# Patient Record
Sex: Female | Born: 1940 | Race: White | Hispanic: No | Marital: Married | Smoking: Never smoker
Health system: Southern US, Community
[De-identification: ages and names within clinical notes are randomized; demographics above are authoritative.]

## PROBLEM LIST (undated history)

## (undated) DIAGNOSIS — K219 Gastro-esophageal reflux disease without esophagitis: Secondary | ICD-10-CM

## (undated) DIAGNOSIS — I480 Paroxysmal atrial fibrillation: Secondary | ICD-10-CM

## (undated) DIAGNOSIS — E039 Hypothyroidism, unspecified: Secondary | ICD-10-CM

## (undated) DIAGNOSIS — Z9289 Personal history of other medical treatment: Secondary | ICD-10-CM

## (undated) DIAGNOSIS — I10 Essential (primary) hypertension: Secondary | ICD-10-CM

## (undated) DIAGNOSIS — E785 Hyperlipidemia, unspecified: Secondary | ICD-10-CM

## (undated) HISTORY — DX: Essential (primary) hypertension: I10

## (undated) HISTORY — DX: Hyperlipidemia, unspecified: E78.5

## (undated) HISTORY — DX: Gastro-esophageal reflux disease without esophagitis: K21.9

## (undated) HISTORY — DX: Hypothyroidism, unspecified: E03.9

## (undated) HISTORY — DX: Personal history of other medical treatment: Z92.89

## (undated) HISTORY — PX: OTHER SURGICAL HISTORY: SHX169

---

## 1978-10-06 HISTORY — PX: WISDOM TOOTH EXTRACTION: SHX21

## 1998-03-02 ENCOUNTER — Other Ambulatory Visit: Admission: RE | Admit: 1998-03-02 | Discharge: 1998-03-02 | Payer: Self-pay | Admitting: Obstetrics and Gynecology

## 1998-08-27 ENCOUNTER — Other Ambulatory Visit: Admission: RE | Admit: 1998-08-27 | Discharge: 1998-08-27 | Payer: Self-pay | Admitting: Obstetrics and Gynecology

## 1998-12-24 ENCOUNTER — Other Ambulatory Visit: Admission: RE | Admit: 1998-12-24 | Discharge: 1998-12-24 | Payer: Self-pay | Admitting: Obstetrics and Gynecology

## 1999-04-18 ENCOUNTER — Other Ambulatory Visit: Admission: RE | Admit: 1999-04-18 | Discharge: 1999-04-18 | Payer: Self-pay | Admitting: Obstetrics and Gynecology

## 1999-10-11 ENCOUNTER — Other Ambulatory Visit: Admission: RE | Admit: 1999-10-11 | Discharge: 1999-10-11 | Payer: Self-pay | Admitting: Obstetrics and Gynecology

## 2000-11-24 ENCOUNTER — Other Ambulatory Visit: Admission: RE | Admit: 2000-11-24 | Discharge: 2000-11-24 | Payer: Self-pay | Admitting: Obstetrics and Gynecology

## 2001-04-26 ENCOUNTER — Encounter (INDEPENDENT_AMBULATORY_CARE_PROVIDER_SITE_OTHER): Payer: Self-pay | Admitting: Specialist

## 2001-04-26 ENCOUNTER — Ambulatory Visit (HOSPITAL_COMMUNITY): Admission: RE | Admit: 2001-04-26 | Discharge: 2001-04-26 | Payer: Self-pay | Admitting: Obstetrics and Gynecology

## 2002-03-22 ENCOUNTER — Encounter (INDEPENDENT_AMBULATORY_CARE_PROVIDER_SITE_OTHER): Payer: Self-pay | Admitting: Gastroenterology

## 2002-07-06 ENCOUNTER — Other Ambulatory Visit: Admission: RE | Admit: 2002-07-06 | Discharge: 2002-07-06 | Payer: Self-pay | Admitting: Obstetrics and Gynecology

## 2003-08-10 ENCOUNTER — Other Ambulatory Visit: Admission: RE | Admit: 2003-08-10 | Discharge: 2003-08-10 | Payer: Self-pay | Admitting: Obstetrics and Gynecology

## 2004-07-16 ENCOUNTER — Encounter (INDEPENDENT_AMBULATORY_CARE_PROVIDER_SITE_OTHER): Payer: Self-pay | Admitting: Gastroenterology

## 2005-01-27 ENCOUNTER — Ambulatory Visit: Payer: Self-pay | Admitting: Gastroenterology

## 2005-01-29 ENCOUNTER — Ambulatory Visit: Payer: Self-pay | Admitting: Gastroenterology

## 2005-01-30 ENCOUNTER — Encounter: Payer: Self-pay | Admitting: Internal Medicine

## 2005-01-31 ENCOUNTER — Other Ambulatory Visit: Admission: RE | Admit: 2005-01-31 | Discharge: 2005-01-31 | Payer: Self-pay | Admitting: Obstetrics and Gynecology

## 2007-02-10 ENCOUNTER — Ambulatory Visit: Payer: Self-pay | Admitting: Gastroenterology

## 2007-11-17 ENCOUNTER — Encounter: Payer: Self-pay | Admitting: Internal Medicine

## 2008-04-10 ENCOUNTER — Ambulatory Visit: Payer: Self-pay | Admitting: Internal Medicine

## 2008-04-10 DIAGNOSIS — K589 Irritable bowel syndrome without diarrhea: Secondary | ICD-10-CM | POA: Insufficient documentation

## 2008-06-19 ENCOUNTER — Encounter: Payer: Self-pay | Admitting: Internal Medicine

## 2009-08-28 ENCOUNTER — Encounter: Payer: Self-pay | Admitting: Internal Medicine

## 2010-11-05 NOTE — Procedures (Signed)
Summary: Colonoscopy   Colonoscopy  Procedure date:  03/22/2002  Findings:      Location:  [facility].    Patient Name: Lindsey Goodman, Lindsey Goodman MRN:  Procedure Procedures: Colonoscopy CPT: [phone].  Personnel: Endoscopist: Ulyess Mort, MD.  Exam Location: Exam performed in Outpatient Clinic. Outpatient  Patient Consent: Procedure, Alternatives, Risks and Benefits discussed, consent obtained, from patient. Consent was obtained by the RN.  Indications Symptoms: Constipation Hematochezia. Abdominal pain / bloating.  History  Pre-Exam Physical: Performed Mar 22, 2002. Rectal exam, Abdominal exam, Extremity exam, Mental status exam WNL.  Exam Exam: Extent of exam reached: Cecum, extent intended: Cecum.  The cecum was identified by appendiceal orifice and IC valve. Colon retroflexion performed. Images were not taken. ASA Classification: II. Tolerance: good.  Monitoring: Pulse and BP monitoring, Oximetry used. Supplemental O2 given.  Colon Prep Prep results: good.  Sedation Meds: Patient assessed and found to be appropriate for moderate (conscious) sedation. Fentanyl 100 mcg. given IV. Versed 7 mg. given IV.  Findings - NOT SEEN ON EXAM: Cecum to Rectum. Polyps, AVM's, Colitis, Tumors, Melanosis, Crohn's, Diverticulosis, Comments: ext. hems.  - HEMORRHOIDS: External. Size: Grade I. Not bleeding. ICD9: Hemorrhoids, External: 455.3.   Assessment Abnormal examination, see findings above.  Diagnoses: 455.3: Hemorrhoids, External.   Events  Unplanned Interventions: No intervention was required.  Unplanned Events: There were no complications. Plans Medication Plan: Continue current medications. Anti-constipation: Stool Surfactant Agents   Fiber supplements: Methylcellulose  Hemorrhoidal Medications:   Disposition: After procedure patient sent to recovery. After recovery patient sent home.   This report was created from the original endoscopy report,  which was reviewed and signed by the above listed endoscopist.    Appended Document: Colonoscopy     Colonoscopy  Procedure date:  03/22/2002  Findings:      Location:  [facility].  Results: Hemorrhoids.       Procedures Next Due Date:    Colonoscopy: 03/2012

## 2010-11-05 NOTE — Consult Note (Signed)
Summary: [facility]   Imported By: Maryln Gottron 12/01/2007 15:14:14  _____________________________________________________________________  External Attachment:    Type:   Image     Comment:   External Document

## 2010-11-05 NOTE — Letter (Signed)
Summary: [facility]  [facility]   Imported By: Lanelle Bal 09/03/2009 11:55:19  _____________________________________________________________________  External Attachment:    Type:   Image     Comment:   External Document

## 2010-11-05 NOTE — Assessment & Plan Note (Signed)
Summary: IBS FLARE   History of Present Illness Visit Type: follow up Primary GI MD: Stan Head MD [facility] Primary Provider: Illene Regulus, MD Requesting Provider: n/a Chief Complaint: flare up of IBS  RUQ/RLQ pain in the afternoons History of Present Illness:   Difficulty with intermittent bloating. She gets RUQ and/or RLQ pain radiating to back, which is same problem over the years. No bleeding reported. Chronic problem but had a bad spell about 1 week ago so came for follow-up. Started Activea yogurt with mild benefit. Has chronic constipation, moves bowels 3 times a week but takes stool softener1-2 a day (Docusate). for that and thinks she won't go without. Once or twice a month takes Correctol with good result. In past on Zelnorm per notes, but she does not remember, may not have been filled and then off market. She attributes at least some of her constipation to anti-hypertensives.   Mainly sees Dr. Tresa Endo for BP f/u and thyroid, cholesterol f/u. Sees Dr. Arelia Sneddon for GYN. Has not seen Dr. Debby Bud in a long time.   Appetite ok. No weight loss.               Prior Medications Reviewed Using: List Brought by Patient  Updated Prior Medication List: LIPITOR 40 MG  TABS (ATORVASTATIN CALCIUM) Take 1 tablet by mouth once a day ZETIA 10 MG  TABS (EZETIMIBE) Take 1 tablet by mouth once a day METOPROLOL TARTRATE 100 MG  TABS (METOPROLOL TARTRATE) one in the am and one in the pm SYNTHROID 100 MCG  TABS (LEVOTHYROXINE SODIUM) Take 1 tablet by mouth once a day COZAAR 100 MG  TABS (LOSARTAN POTASSIUM) one tablet daily OMEPRAZOLE 20 MG  CPDR (OMEPRAZOLE) 1 each day 30 minutes before meal TEKTURNA 300 MG  TABS (ALISKIREN FUMARATE) one tablet every day METOPROLOL TARTRATE 25 MG  TABS (METOPROLOL TARTRATE) one every pm with  Current Allergies (reviewed today): PREDNISONE NORVASC  Past Medical History:    HIatal Hernia    Hemorrhoids    GERD    Hyperlipidemia    Hypothyroidism  Irritable Bowel Syndrome   Family History:    No FH of Colon Cancer:    Family History of Heart Disease:     Family History of Kidney Disease: grandfather  Social History:    Patient has never smoked.     Alcohol Use - yes  wine occ.    Daily Caffeine Use   some tea    Illicit Drug Use - no    Patient gets regular exercise.   Risk Factors:  Tobacco use:  never Drug use:  no Alcohol use:  yes Exercise:  yes    Vital Signs:  Patient Profile:   70 Years Old Female Height:     65 inches Weight:      145 pounds BMI:     24.22 Pulse (ortho):   80 / minute Pulse rhythm:   regular BP sitting:   114 / 80  (left arm)  Vitals Entered By: Merri Ray CMA (April 10, 2008 9:49 AM)                  Physical Exam  General:     Well developed, well nourished, no acute distress. Head:     Normocephalic and atraumatic. Eyes:     anicteric Lungs:     Clear throughout to auscultation. Heart:     Regular rate and rhythm; no murmurs, rubs,  or bruits. Abdomen:     Soft,  nontender and nondistended. No masses, hepatosplenomegaly or hernias noted. Normal bowel sounds. Neurologic:     Alert and  oriented x4;  grossly normal neurologically.    Impression & Recommendations:  Problem # 1:  IRRITABLE BOWEL SYNDROME (ICD-564.1) Assessment: Unchanged Her symptoms of bloating, RUQ/RLQ pain and cosntipation are chronic and sound like what has been described in the chart by Dr. Victorino Dike at prior evaluations.  It does not sound like gallbladder and ultrasound negative on 01/30/05 except for renal cysts. Will try Align and MiraLax with f/u as needed. She needs a routine colonoscopy in 2013 (average-risk patient).     Patient Instructions: 1)  Take 1 Align capsule daily (replaces Activea yogurt) 2)  Start MiraLax 17 grams or 1 tablespoon in 8 oz liquid daily and adjust up or down for constipation relief.  3)  You will probably not need your stool softener any more. 4)  If  you do not find relief from this regimen after 1-2 months, return to me. 5)  Copy Sent To: Dr. Daphene Jaeger and Dr. Richardean Chimera    ]

## 2010-11-05 NOTE — Consult Note (Signed)
Summary: [facility]  [facility]   Imported By: Esmeralda Links D'jimraou 06/27/2008 15:16:03  _____________________________________________________________________  External Attachment:    Type:   Image     Comment:   External Document

## 2010-12-09 DIAGNOSIS — Z9289 Personal history of other medical treatment: Secondary | ICD-10-CM

## 2010-12-09 HISTORY — DX: Personal history of other medical treatment: Z92.89

## 2011-02-21 NOTE — Assessment & Plan Note (Signed)
[facility]                         GASTROENTEROLOGY OFFICE NOTE   [name], [name]                        MRN:          [id number]  DATE:02/10/2007                            DOB:          January 09, 1941    Lindsey Goodman comes in says she has been having some bright red blood where she  strains and had a bowel movement, possibly due to hemorrhoids, she is  having them just intermittently. Patient asked about stool softeners. I  talked to her in depth about that it was ok for her to have stool  softeners, __________ at [facility] last year that people over 50 could be  on stool softeners as necessary. I have seen her in the past with  complaint of constipation related to what I thought was irritable bowel  syndrome. She also has GERD with positive __________ amputations,  hyperlipidemia, and history of hypothyroidism. We put her on some  Zelnorm at that time but this has been taken off the market.   On physical examination Lindsey Goodman weighed 132, blood pressure 118/70, pulse  60 and regular.  NECK, HEART, EXTREMITIES:  Unremarkable.   IMPRESSION:  1. Rectal bleeding, probably hemorrhoidal.  The patient has known      external hemorrhoids and had a colonoscopic examination in 2003 and      complained of constipation at that time.  2. Gastroesophageal reflux disease.  3. History of hypothyroidism.  4. Hyperlipidemia.   RECOMMENDATIONS:  She is to use Senokot with Senna, prunes, hemorrhoidal  cream. I gave her samples of this. If she was not any better we need to  pursue repeat colonoscopic examination sometime in the near future. It  has been approximately 5 years since her last procedure. I told her that  she should try the above measures and just to let us know how she  responds.     Ulyess Mort, MD  Electronically Signed    SML/MedQ  DD: 02/10/2007  DT: 02/10/2007  Job #: [id number]

## 2011-02-21 NOTE — Op Note (Signed)
[facility]  Patient:    Lindsey Goodman, Lindsey Goodman                        MRN: [id number] Proc. Date: 04/26/01 Adm. Date:  [id number] Attending:  Frederich Balding                           Operative Report  PREOPERATIVE DIAGNOSIS:       Abnormal bleeding with evidence of endometrial polyps or submucosal fibroids.  POSTOPERATIVE DIAGNOSIS:      Abnormal bleeding with evidence of endometrial polyps or submucosal fibroids, with pathology pending.  OPERATION/PROCEDURE:          Hysteroscopic evaluation and resection of endometrial polyps and fibroids.  SURGEON:                      Juluis Mire, M.D.  ANESTHESIA:                   Laryngeal general.  ESTIMATED BLOOD LOSS:         Minimal.  PACKS/DRAINS:                 None.  INTRAOPERATIVE BLOOD REPLACED:                     None.  COMPLICATIONS:                None.  INDICATIONS FOR PROCEDURE:    Dictated in the History and Physical.  DESCRIPTION OF PROCEDURE:     The patient was taken to the operating room and placed in the supine position.  AFter a satisfactory level of laryngeal general anesthesia was obtained the patient was placed in the dorsal lithotomy position using the Allen stirrups.  The perineum and vagina were prepped with Betadine and draped as a sterile field.  A speculum was placed in the vaginal vault and the cervix grasped with a single-tooth tenaculum.  The uterus was sounded to 8 cm.  The cervix was dilated to a size 31 Pratt dilator and the operative hysteroscope then reduced.  Evaluation did reveal a polyp on the posterior left wall.  Above that was a submucosal fibroid.  She also had a submucosal fibroid on the right lateral wall.  All these areas were resected using a resectoscope, with no evidence of uterine perforation or active bleeding.  We did obtain multiple endometrial samplings with the resectoscope and then endometrial curettings.  Again, she had minimal bleeding  and no evidence of perforation.  At this point in time the single-tooth tenaculum and speculum were removed.  The patient was taken out of the dorsal lithotomy position and once extubated and alert was transferred to the recovery room in good condition.  Sponge, needle, and instrument counts were reported as correct by the circulating nurse. DD:  04/27/01 TD:  04/27/01 Job: [id number]

## 2011-12-15 ENCOUNTER — Encounter (INDEPENDENT_AMBULATORY_CARE_PROVIDER_SITE_OTHER): Payer: Self-pay | Admitting: General Surgery

## 2012-01-14 ENCOUNTER — Telehealth (INDEPENDENT_AMBULATORY_CARE_PROVIDER_SITE_OTHER): Payer: Self-pay

## 2012-01-14 NOTE — Telephone Encounter (Signed)
error 

## 2012-01-16 ENCOUNTER — Ambulatory Visit (INDEPENDENT_AMBULATORY_CARE_PROVIDER_SITE_OTHER): Payer: Medicare Other | Admitting: General Surgery

## 2012-01-16 ENCOUNTER — Encounter (INDEPENDENT_AMBULATORY_CARE_PROVIDER_SITE_OTHER): Payer: Self-pay | Admitting: General Surgery

## 2012-01-16 VITALS — BP 144/86 | HR 64 | Temp 98.0°F | Resp 16 | Ht 65.0 in | Wt 145.6 lb

## 2012-01-16 DIAGNOSIS — D171 Benign lipomatous neoplasm of skin and subcutaneous tissue of trunk: Secondary | ICD-10-CM | POA: Insufficient documentation

## 2012-01-16 DIAGNOSIS — D1779 Benign lipomatous neoplasm of other sites: Secondary | ICD-10-CM

## 2012-01-16 NOTE — Assessment & Plan Note (Signed)
Mass is minimally larger vs unchanged.  Would leave it alone. Recommended follow up PRN. We can remove if painful or rubbing on furniture or bra.    No risk of malignancy.

## 2012-01-16 NOTE — Progress Notes (Signed)
HISTORY: The patient is a 71 year old female last saw last year for a right back lipoma. She was very concerned about this. Right after it was detected by her primary care physician, she was very concerned about his risk for malignancy. I reassured her last year and did a careful measurement. She is here to followup to make sure that it has not grown significantly. She continues to deny symptoms of the mass. She does have some back pain that is remote from the location of the mass. She does not notice that when she leans back and does not feel it rubbing on her clothing.  She denies any other new medical problems. She has not noticed any masses anywhere else.   PERTINENT REVIEW OF SYSTEMS: Otherwise negative.    EXAM: Head: Normocephalic and atraumatic.  Eyes:  Conjunctivae are normal. Pupils are equal, round, and reactive to light. No scleral icterus.  Neck:  Normal range of motion. Neck supple. No tracheal deviation present. No thyromegaly present.  Resp: No respiratory distress, normal effort. Abd:  Abdomen is soft, non distended and non tender. No masses are palpable.  There is no rebound and no guarding.  Neurological: Alert and oriented to person, place, and time. Coordination normal.  Skin: Skin is warm and dry. No rash noted. No diaphoretic. No erythema. No pallor.  Mass is 3.5 x 1.5 x 1 cm and mobile.   Psychiatric: Normal mood and affect. Normal behavior. Judgment and thought content normal.      ASSESSMENT AND PLAN:   Lipoma of back Mass is minimally larger vs unchanged.  Would leave it alone. Recommended follow up PRN. We can remove if painful or rubbing on furniture or bra.    No risk of malignancy.       Maudry Diego, MD Surgical Oncology, General & Endocrine Surgery [facility]  Lennette Bihari, MD, MD Arelia Sneddon, Raphael Gibney, MD

## 2012-01-16 NOTE — Patient Instructions (Signed)
Follow up if mass enlarging and becoming symptomatic.

## 2012-02-04 ENCOUNTER — Encounter: Payer: Self-pay | Admitting: Internal Medicine

## 2012-07-02 ENCOUNTER — Encounter: Payer: Self-pay | Admitting: Internal Medicine

## 2012-07-29 ENCOUNTER — Ambulatory Visit (AMBULATORY_SURGERY_CENTER): Payer: Medicare Other | Admitting: *Deleted

## 2012-07-29 VITALS — Ht 65.0 in | Wt 146.5 lb

## 2012-07-29 DIAGNOSIS — Z1211 Encounter for screening for malignant neoplasm of colon: Secondary | ICD-10-CM

## 2012-07-29 MED ORDER — NA SULFATE-K SULFATE-MG SULF 17.5-3.13-1.6 GM/177ML PO SOLN: ORAL | Status: DC

## 2012-08-09 ENCOUNTER — Ambulatory Visit (INDEPENDENT_AMBULATORY_CARE_PROVIDER_SITE_OTHER): Payer: Medicare Other | Admitting: Family Medicine

## 2012-08-09 VITALS — BP 130/64 | HR 65 | Temp 98.6°F | Resp 18 | Ht 63.5 in | Wt 145.6 lb

## 2012-08-09 DIAGNOSIS — J040 Acute laryngitis: Secondary | ICD-10-CM

## 2012-08-09 MED ORDER — HYDROCODONE-HOMATROPINE 5-1.5 MG/5ML PO SYRP: 5.0000 mL | ORAL_SOLUTION | Freq: Three times a day (TID) | ORAL | Status: DC | PRN

## 2012-08-09 NOTE — Progress Notes (Signed)
71 yo woman with 8 days of cough, hoarseness, and mild right ear discomfort.  Improving now, but the hoarseness and cough are hanging on.  Objective: NAD HEENT:  Unremarkable Chest:  Clear Voice: hoarse  Assessment:  Laryngitis, viral  Plan:  hydromet

## 2012-08-12 ENCOUNTER — Encounter: Payer: Medicare Other | Admitting: Internal Medicine

## 2013-01-05 ENCOUNTER — Encounter: Payer: Self-pay | Admitting: Internal Medicine

## 2013-01-31 ENCOUNTER — Encounter: Payer: Self-pay | Admitting: Internal Medicine

## 2013-03-17 ENCOUNTER — Ambulatory Visit (AMBULATORY_SURGERY_CENTER): Payer: Medicare Other | Admitting: *Deleted

## 2013-03-17 VITALS — Ht 65.0 in | Wt 150.8 lb

## 2013-03-17 DIAGNOSIS — Z1211 Encounter for screening for malignant neoplasm of colon: Secondary | ICD-10-CM

## 2013-03-23 ENCOUNTER — Other Ambulatory Visit: Payer: Self-pay | Admitting: Cardiovascular Disease

## 2013-03-23 ENCOUNTER — Telehealth: Payer: Self-pay | Admitting: *Deleted

## 2013-03-23 NOTE — Telephone Encounter (Signed)
Refilled patient's levothyroxine to pharmacy.

## 2013-03-24 ENCOUNTER — Ambulatory Visit (AMBULATORY_SURGERY_CENTER): Payer: Medicare Other | Admitting: Internal Medicine

## 2013-03-24 ENCOUNTER — Encounter: Payer: Self-pay | Admitting: Internal Medicine

## 2013-03-24 VITALS — BP 110/57 | HR 56 | Temp 97.1°F | Resp 17 | Ht 65.0 in | Wt 150.0 lb

## 2013-03-24 DIAGNOSIS — K644 Residual hemorrhoidal skin tags: Secondary | ICD-10-CM

## 2013-03-24 DIAGNOSIS — K648 Other hemorrhoids: Secondary | ICD-10-CM

## 2013-03-24 DIAGNOSIS — Z1211 Encounter for screening for malignant neoplasm of colon: Secondary | ICD-10-CM

## 2013-03-24 DIAGNOSIS — K5730 Diverticulosis of large intestine without perforation or abscess without bleeding: Secondary | ICD-10-CM

## 2013-03-24 MED ORDER — SODIUM CHLORIDE 0.9 % IV SOLN: 500.0000 mL | INTRAVENOUS | Status: DC

## 2013-03-24 NOTE — Patient Instructions (Addendum)
The colonoscopy did not show any polyps or cancer!  You do have diverticulosis and hemorrhoids.  Next routine colonoscopy - 10 years though at 58 you might not need it.  If the hemorrhoids are bothering you I offer an in-office banding procedure that can help. You can arrange a visit with me to find out more if desired.  I appreciate the opportunity to care for you. Iva Boop, MD, FACG  YOU HAD AN ENDOSCOPIC PROCEDURE TODAY AT THE [facility]: Refer to the procedure report that was given to you for any specific questions about what was found during the examination.  If the procedure report does not answer your questions, please call your gastroenterologist to clarify.  If you requested that your care partner not be given the details of your procedure findings, then the procedure report has been included in a sealed envelope for you to review at your convenience later.  YOU SHOULD EXPECT: Some feelings of bloating in the abdomen. Passage of more gas than usual.  Walking can help get rid of the air that was put into your GI tract during the procedure and reduce the bloating. If you had a lower endoscopy (such as a colonoscopy or flexible sigmoidoscopy) you may notice spotting of blood in your stool or on the toilet paper. If you underwent a bowel prep for your procedure, then you may not have a normal bowel movement for a few days.  DIET: Your first meal following the procedure should be a light meal and then it is ok to progress to your normal diet.  A half-sandwich or bowl of soup is an example of a good first meal.  Heavy or fried foods are harder to digest and may make you feel nauseous or bloated.  Likewise meals heavy in dairy and vegetables can cause extra gas to form and this can also increase the bloating.  Drink plenty of fluids but you should avoid alcoholic beverages for 24 hours.  ACTIVITY: Your care partner should take you home directly after the procedure.  You should  plan to take it easy, moving slowly for the rest of the day.  You can resume normal activity the day after the procedure however you should NOT DRIVE or use heavy machinery for 24 hours (because of the sedation medicines used during the test).    SYMPTOMS TO REPORT IMMEDIATELY: A gastroenterologist can be reached at any hour.  During normal business hours, 8:30 AM to 5:00 PM Monday through Friday, call [phone].  After hours and on weekends, please call the GI answering service at [phone] who will take a message and have the physician on call contact you.   Following lower endoscopy (colonoscopy or flexible sigmoidoscopy):  Excessive amounts of blood in the stool  Significant tenderness or worsening of abdominal pains  Swelling of the abdomen that is new, acute  Fever of 100F or higher  FOLLOW UP: If any biopsies were taken you will be contacted by phone or by letter within the next 1-3 weeks.  Call your gastroenterologist if you have not heard about the biopsies in 3 weeks. Our staff will call the home number listed on your records the next business day following your procedure to check on you and address any questions or concerns that you may have at that time regarding the information given to you following your procedure. This is a courtesy call and so if there is no answer at the home number and we have  not heard from you through the emergency physician on call, we will assume that you have returned to your regular daily activities without incident.  SIGNATURES/CONFIDENTIALITY: You and/or your care partner have signed paperwork which will be entered into your electronic medical record.  These signatures attest to the fact that that the information above on your After Visit Summary has been reviewed and is understood.  Full responsibility of the confidentiality of this discharge information lies with you and/or your care-partner.  Diverticulosis, hemorrhoids-handouts given  Call  office to discuss hemorrhoid therapy if desired

## 2013-03-24 NOTE — Op Note (Signed)
[facility] [address] [place], [address]   COLONOSCOPY PROCEDURE REPORT  PATIENT: Lindsey Goodman, Lindsey Goodman  MR#: [id number] BIRTHDATE: 1941-06-17 , 72  yrs. old GENDER: Female ENDOSCOPIST: Iva Boop, MD, [facility] PROCEDURE DATE:  03/24/2013 PROCEDURE:   Colonoscopy, screening ASA CLASS:   Class II INDICATIONS:average risk screening and Last colonoscopy performed 10 years ago. MEDICATIONS: propofol (Diprivan) 200mg  IV, MAC sedation, administered by CRNA, and These medications were titrated to patient response per physician's verbal order  DESCRIPTION OF PROCEDURE:   After the risks benefits and alternatives of the procedure were thoroughly explained, informed consent was obtained.  A digital rectal exam revealed no abnormalities of the rectum.   The LB [id number]  endoscope was introduced through the anus and advanced to the cecum, which was identified by both the appendix and ileocecal valve. No adverse events experienced.   The quality of the prep was excellent using Suprep  The instrument was then slowly withdrawn as the colon was fully examined.      COLON FINDINGS: Moderate diverticulosis was noted in the sigmoid colon.   The colon mucosa was otherwise normal.   A right colon retroflexion was performed.  Retroflexed views revealed internal/external hemorrhoids. The time to cecum=2 minutes 30 seconds.  Withdrawal time=7 minutes 0 seconds.  The scope was withdrawn and the procedure completed. COMPLICATIONS: There were no complications.  ENDOSCOPIC IMPRESSION: 1.   Moderate diverticulosis was noted in the sigmoid colon 2.   Internal hemorrhoids 3.   External hemorrhoids 4.   Normal colonoscopy otherwise - excellent prep  RECOMMENDATIONS: 1.  Repeat Colonscopy in 10 years (will be 82 so office assessment before procedure likely) 2.   Call office for appointment to discuss hemorrhoid therapy if desired   eSigned:  Iva Boop, MD, [facility]  03/24/2013 8:51 AM   cc: The Patient and Richardean Chimera, MD

## 2013-03-24 NOTE — Progress Notes (Signed)
Procedure ends, to recovery, report given, VSS and awake

## 2013-03-24 NOTE — Progress Notes (Signed)
Patient did not experience any of the following events: a burn prior to discharge; a fall within the facility; wrong site/side/patient/procedure/implant event; or a hospital transfer or hospital admission upon discharge from the facility. (G8907) Patient did not have preoperative order for IV antibiotic SSI prophylaxis. (G8918)  

## 2013-03-25 ENCOUNTER — Telehealth: Payer: Self-pay | Admitting: *Deleted

## 2013-03-25 NOTE — Telephone Encounter (Signed)
No identifier, left, message, follow-up

## 2013-06-22 ENCOUNTER — Other Ambulatory Visit: Payer: Self-pay | Admitting: *Deleted

## 2013-06-22 DIAGNOSIS — E8881 Metabolic syndrome: Secondary | ICD-10-CM

## 2013-06-22 DIAGNOSIS — Z79899 Other long term (current) drug therapy: Secondary | ICD-10-CM

## 2013-06-22 DIAGNOSIS — R5383 Other fatigue: Secondary | ICD-10-CM

## 2013-06-22 DIAGNOSIS — R5381 Other malaise: Secondary | ICD-10-CM

## 2013-06-30 ENCOUNTER — Other Ambulatory Visit: Payer: Self-pay | Admitting: Cardiovascular Disease

## 2013-07-01 NOTE — Telephone Encounter (Signed)
Rx was sent to pharmacy electronically. 

## 2013-07-18 ENCOUNTER — Other Ambulatory Visit: Payer: Self-pay | Admitting: Cardiovascular Disease

## 2013-07-18 ENCOUNTER — Telehealth: Payer: Self-pay | Admitting: *Deleted

## 2013-07-18 ENCOUNTER — Telehealth: Payer: Self-pay | Admitting: Cardiovascular Disease

## 2013-07-18 ENCOUNTER — Other Ambulatory Visit: Payer: Self-pay | Admitting: *Deleted

## 2013-07-18 DIAGNOSIS — Z79899 Other long term (current) drug therapy: Secondary | ICD-10-CM

## 2013-07-18 DIAGNOSIS — E782 Mixed hyperlipidemia: Secondary | ICD-10-CM

## 2013-07-18 DIAGNOSIS — E039 Hypothyroidism, unspecified: Secondary | ICD-10-CM

## 2013-07-18 NOTE — Telephone Encounter (Signed)
Please mailed out a lab order before her appt on 08/04/2013...   Thanks

## 2013-07-18 NOTE — Telephone Encounter (Signed)
Patient returned call to me in reference to her labwork.  I informed her that I will place the orders into epic and all she will need to do is go fasting to have them drawn. Patient voiced understanding.

## 2013-07-18 NOTE — Telephone Encounter (Signed)
Left message to return a call in reference to lab test request.

## 2013-07-18 NOTE — Telephone Encounter (Signed)
Rx was sent to pharmacy electronically. 

## 2013-07-27 ENCOUNTER — Other Ambulatory Visit: Payer: Self-pay | Admitting: Cardiovascular Disease

## 2013-07-28 LAB — CBC
HCT: 41.4 % (ref 36.0–46.0)
Hemoglobin: 14 g/dL (ref 12.0–15.0)
MCH: 30.8 pg (ref 26.0–34.0)
MCHC: 33.8 g/dL (ref 30.0–36.0)
MCV: 91.2 fL (ref 78.0–100.0)
Platelets: 305 10*3/uL (ref 150–400)
RBC: 4.54 MIL/uL (ref 3.87–5.11)
RDW: 14.1 % (ref 11.5–15.5)
WBC: 6.8 10*3/uL (ref 4.0–10.5)

## 2013-07-28 LAB — HEMOGLOBIN A1C
Hgb A1c MFr Bld: 6.1 % — ABNORMAL HIGH (ref <5.7)
Mean Plasma Glucose: 128 mg/dL — ABNORMAL HIGH (ref <117)

## 2013-07-28 LAB — BASIC METABOLIC PANEL
BUN: 16 mg/dL (ref 6–23)
CO2: 23 mEq/L (ref 19–32)
Calcium: 10.6 mg/dL — ABNORMAL HIGH (ref 8.4–10.5)
Chloride: 97 mEq/L (ref 96–112)
Creat: 0.89 mg/dL (ref 0.50–1.10)
Glucose, Bld: 104 mg/dL — ABNORMAL HIGH (ref 70–99)
Potassium: 4.8 mEq/L (ref 3.5–5.3)
Sodium: 133 mEq/L — ABNORMAL LOW (ref 135–145)

## 2013-07-28 LAB — LIPID PANEL
Cholesterol: 162 mg/dL (ref 0–200)
HDL: 54 mg/dL (ref >39)
LDL Cholesterol: 92 mg/dL (ref 0–99)
Total CHOL/HDL Ratio: 3 Ratio
Triglycerides: 80 mg/dL (ref <150)
VLDL: 16 mg/dL (ref 0–40)

## 2013-07-28 LAB — TSH: TSH: 1.565 u[IU]/mL (ref 0.350–4.500)

## 2013-08-04 ENCOUNTER — Encounter: Payer: Self-pay | Admitting: Cardiovascular Disease

## 2013-08-04 ENCOUNTER — Ambulatory Visit (INDEPENDENT_AMBULATORY_CARE_PROVIDER_SITE_OTHER): Payer: Medicare Other | Admitting: Cardiovascular Disease

## 2013-08-04 VITALS — BP 130/72 | HR 57 | Ht 64.0 in | Wt 143.3 lb

## 2013-08-04 DIAGNOSIS — R7309 Other abnormal glucose: Secondary | ICD-10-CM

## 2013-08-04 DIAGNOSIS — E785 Hyperlipidemia, unspecified: Secondary | ICD-10-CM

## 2013-08-04 DIAGNOSIS — R5381 Other malaise: Secondary | ICD-10-CM

## 2013-08-04 DIAGNOSIS — R5383 Other fatigue: Secondary | ICD-10-CM

## 2013-08-04 DIAGNOSIS — I10 Essential (primary) hypertension: Secondary | ICD-10-CM

## 2013-08-04 DIAGNOSIS — E039 Hypothyroidism, unspecified: Secondary | ICD-10-CM

## 2013-08-04 DIAGNOSIS — I701 Atherosclerosis of renal artery: Secondary | ICD-10-CM | POA: Insufficient documentation

## 2013-08-04 DIAGNOSIS — Z79899 Other long term (current) drug therapy: Secondary | ICD-10-CM

## 2013-08-04 DIAGNOSIS — K219 Gastro-esophageal reflux disease without esophagitis: Secondary | ICD-10-CM

## 2013-08-04 NOTE — Patient Instructions (Signed)
Your physician recommends that you schedule a follow-up appointment in:  April 2015  Your physician recommends that you return for lab work in: March 2015 CBC, CMP, LIPIDS, TSH, A1C  Your physician has requested that you have a renal artery duplex. During this test, an ultrasound is used to evaluate blood flow to the kidneys. Allow one hour for this exam. Do not eat after midnight the day before and avoid carbonated beverages. Take your medications as you usually do. March 2015

## 2013-08-04 NOTE — Progress Notes (Signed)
Patient ID: Craig Staggers, female   DOB: Jul 11, 1941, 72 y.o.   MRN: [id number]     HPI: HARUMI YAMIN is a 72 y.o. female presents for a 8 month followup cardiology evaluation.  Mrs. he is a 72 year old female who has a history of hypertension, hypothyroidism, hyperlipidemia, as well as GERD. Remotely, she had developed a cough secondary to ACE inhibition and also developed hyponatremia secondary to hydrochlorothiazide. 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 She had equal to or greater than 60% diameter reduction in the right renal artery. Her left renal artery have normal patency. Her kidney sizes were normal. Since I last saw her, she has remained fairly asymptomatic. Her blood pressure has been well controlled with combination Benicar 40 mg as well as metoprolol tartrate 125 mg twice a day. She also takes Aldactone 25 mg daily. She has been on combination therapy with Zetia 10 mg and atorvastatin 40 mg for her hyperlipidemia. She has taken Prilosec 20 mg for her GERD. She denies recent chest pain. She denies shortness of breath third time she does note some mild disability.  She did have recent laboratory one week ago which showed a sodium of 133. Fasting glucose 104. BUN 16 creatinine 0.89. Calcium was borderline elevated at 10.6. Showed a normal CBC. Total cholesterol was 162 triglycerides 80 HDL 54 LDL 92. Hemoglobin A1c was mildly elevated at 6.1.  Past Medical History  Diagnosis Date  . GERD (gastroesophageal reflux disease)   . Hypertension   . Hyperlipidemia     Past Surgical History  Procedure Laterality Date  . No prior surgery      Allergies  Allergen Reactions  . Amlodipine Besylate Swelling  . Prednisone     No appetite    Current Outpatient Prescriptions  Medication Sig Dispense Refill  . atorvastatin (LIPITOR) 40 MG tablet daily.      Marland Kitchen BENICAR  40 MG tablet daily.      Marland Kitchen levothyroxine (SYNTHROID, LEVOTHROID) 100 MCG tablet TAKE 1 TABLET DAILY.  30 tablet  4  . metoprolol (LOPRESSOR) 100 MG tablet Take 100 mg by mouth 2 (two) times daily.      . metoprolol tartrate (LOPRESSOR) 25 MG tablet Take 25 mg by mouth 2 (two) times daily.      Marland Kitchen omeprazole (PRILOSEC) 20 MG capsule daily.      Marland Kitchen spironolactone (ALDACTONE) 25 MG tablet TAKE 1 TABLET EVERY DAY  30 tablet  5  . ZETIA 10 MG tablet daily.       No current facility-administered medications for this visit.    History   Social History  . Marital Status: Married    Spouse Name: N/A    Number of Children: N/A  . Years of Education: N/A   Occupational History  . Not on file.   Social History Main Topics  . Smoking status: Never Smoker   . Smokeless tobacco: Never Used  . Alcohol Use: No  . Drug Use: No  . Sexual Activity: Yes    Birth Control/ Protection: None   Other Topics Concern  . Not on file   Social History Narrative  . No narrative on file    Family History  Problem Relation Age of Onset  . Hypertension Mother   . Hypertension Father   . Stroke Father   . Colon cancer Neg Hx   .  Stomach cancer Neg Hx    Social history is notable that she is married has 2 children. He does travel with her husband's work as well as vacation. She does exercise. There is no tobacco or alcohol use.  ROS is negative for fevers, chills or night sweats she denies or cough. She denies sputum production. She denies wheezing. She denies palpitations. She denies presyncope or syncope. She denies chest pressure. She denies pleuritic symptoms. She denies change in bowel bladder habits. There is no nausea vomiting or diarrhea. She denies GU or GI symptoms. She denies claudication. She denies neuropathy. She does note easy bruisability. She denies myalgias. She is unaware of overt diabetes. She denies change in mood or affect.   Other comprehensive 12 point system review is  negative.  PE BP 130/72  Pulse 57  Ht 5\' 4"  (1.626 m)  Wt 143 lb 4.8 oz (65 kg)  BMI 24.59 kg/m2  General: Alert, oriented, no distress.  Skin: normal turgor, no rashes, small area of ecchymosis in the region of the wrist HEENT: Normocephalic, atraumatic. Pupils round and reactive; sclera anicteric;no lid lag.  Nose without nasal septal hypertrophy Mouth/Parynx benign; Mallinpatti scale 2 Neck: No JVD, no carotid briuts Lungs: clear to ausculatation and percussion; no wheezing or rales Heart: RRR, s1 s2 normal 1/6 sem Abdomen: Abdominal bruit ; soft, nontender; no hepatosplenomehaly, BS+; abdominal aorta nontender and not dilated by palpation. Pulses 2+ Extremities: no clubbing cyanosis or edema, Homan's sign negative  Neurologic: grossly nonfocal Psychologic: normal affect and mood.  ECG: Sinus bradycardia 57 beats per minute. Normal intervals. No significant ST changes.  LABS:  BMET    Component Value Date/Time   NA 133* 07/27/2013 1015   K 4.8 07/27/2013 1015   CL 97 07/27/2013 1015   CO2 23 07/27/2013 1015   GLUCOSE 104* 07/27/2013 1015   BUN 16 07/27/2013 1015   CREATININE 0.89 07/27/2013 1015   CALCIUM 10.6* 07/27/2013 1015     Hepatic Function Panel  No results found for this basename: prot, albumin, ast, alt, alkphos, bilitot, bilidir, ibili     CBC    Component Value Date/Time   WBC 6.8 07/27/2013 1015   RBC 4.54 07/27/2013 1015   HGB 14.0 07/27/2013 1015   HCT 41.4 07/27/2013 1015   PLT 305 07/27/2013 1015   MCV 91.2 07/27/2013 1015   MCH 30.8 07/27/2013 1015   MCHC 33.8 07/27/2013 1015   RDW 14.1 07/27/2013 1015     BNP No results found for this basename: probnp    Lipid Panel     Component Value Date/Time   CHOL 162 07/27/2013 1015   TRIG 80 07/27/2013 1015   HDL 54 07/27/2013 1015   CHOLHDL 3.0 07/27/2013 1015   VLDL 16 07/27/2013 1015   LDLCALC 92 07/27/2013 1015     RADIOLOGY: No results found.    ASSESSMENT AND  PLAN: This is that he is now 72 years old. 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 Her last renal duplex scan was March 2013 at which time her PSV was 261 with EDV 55 on the right. He was also found to have narrowing in the distal segment of her aorta. Her blood pressure today is well controlled. I have suggested she take a baby aspirin at least every other day if possible but to be cognizant that this may cause bruisability. Her calcium is borderline upper normal to slightly elevated. She is on thyroid replacement for hypothyroidism.  Her TSH was normal. In 6 months, I'm recommending she undergo a two-year followup renal duplex scan. Repeat laboratory will be obtained I will see her back in the office for followup evaluation.     Lennette Bihari, MD, [facility]  08/04/2013 8:54 AM

## 2013-08-10 ENCOUNTER — Encounter: Payer: Self-pay | Admitting: *Deleted

## 2013-08-10 NOTE — Progress Notes (Signed)
Quick Note:  Lab result letter sent to patient. ______

## 2013-08-14 ENCOUNTER — Other Ambulatory Visit: Payer: Self-pay | Admitting: Cardiovascular Disease

## 2013-08-15 NOTE — Telephone Encounter (Signed)
Rx was sent to pharmacy electronically. 

## 2013-08-21 ENCOUNTER — Other Ambulatory Visit: Payer: Self-pay | Admitting: Cardiovascular Disease

## 2013-11-26 ENCOUNTER — Other Ambulatory Visit: Payer: Self-pay | Admitting: Cardiovascular Disease

## 2013-12-02 ENCOUNTER — Encounter: Payer: Self-pay | Admitting: *Deleted

## 2013-12-02 ENCOUNTER — Other Ambulatory Visit: Payer: Self-pay | Admitting: *Deleted

## 2013-12-02 DIAGNOSIS — R5381 Other malaise: Secondary | ICD-10-CM

## 2013-12-02 DIAGNOSIS — R5383 Other fatigue: Secondary | ICD-10-CM

## 2013-12-02 DIAGNOSIS — Z79899 Other long term (current) drug therapy: Secondary | ICD-10-CM

## 2013-12-02 DIAGNOSIS — R7309 Other abnormal glucose: Secondary | ICD-10-CM

## 2013-12-02 DIAGNOSIS — E785 Hyperlipidemia, unspecified: Secondary | ICD-10-CM

## 2013-12-10 ENCOUNTER — Other Ambulatory Visit: Payer: Self-pay | Admitting: Cardiovascular Disease

## 2013-12-11 ENCOUNTER — Other Ambulatory Visit: Payer: Self-pay | Admitting: Cardiovascular Disease

## 2013-12-12 NOTE — Telephone Encounter (Signed)
Rx was sent to pharmacy electronically. 

## 2013-12-14 LAB — LIPID PANEL
Cholesterol: 145 mg/dL (ref 0–200)
HDL: 44 mg/dL (ref >39)
LDL Cholesterol: 86 mg/dL (ref 0–99)
Total CHOL/HDL Ratio: 3.3 Ratio
Triglycerides: 73 mg/dL (ref <150)
VLDL: 15 mg/dL (ref 0–40)

## 2013-12-14 LAB — CBC
HCT: 40.8 % (ref 36.0–46.0)
Hemoglobin: 13.6 g/dL (ref 12.0–15.0)
MCH: 29.8 pg (ref 26.0–34.0)
MCHC: 33.3 g/dL (ref 30.0–36.0)
MCV: 89.3 fL (ref 78.0–100.0)
Platelets: 285 10*3/uL (ref 150–400)
RBC: 4.57 MIL/uL (ref 3.87–5.11)
RDW: 14 % (ref 11.5–15.5)
WBC: 5.9 10*3/uL (ref 4.0–10.5)

## 2013-12-14 LAB — COMPREHENSIVE METABOLIC PANEL
ALT: 17 U/L (ref 0–35)
AST: 23 U/L (ref 0–37)
Albumin: 4.5 g/dL (ref 3.5–5.2)
Alkaline Phosphatase: 66 U/L (ref 39–117)
BUN: 13 mg/dL (ref 6–23)
CO2: 27 mEq/L (ref 19–32)
Calcium: 10 mg/dL (ref 8.4–10.5)
Chloride: 98 mEq/L (ref 96–112)
Creat: 0.89 mg/dL (ref 0.50–1.10)
Glucose, Bld: 100 mg/dL — ABNORMAL HIGH (ref 70–99)
Potassium: 5.2 mEq/L (ref 3.5–5.3)
Sodium: 135 mEq/L (ref 135–145)
Total Bilirubin: 0.6 mg/dL (ref 0.2–1.2)
Total Protein: 7 g/dL (ref 6.0–8.3)

## 2013-12-15 LAB — TSH: TSH: 1.688 u[IU]/mL (ref 0.350–4.500)

## 2013-12-19 ENCOUNTER — Telehealth: Payer: Self-pay | Admitting: *Deleted

## 2013-12-19 MED ORDER — OLMESARTAN MEDOXOMIL 20 MG PO TABS: 40.0000 mg | ORAL_TABLET | Freq: Every day | ORAL | Status: DC

## 2013-12-19 NOTE — Telephone Encounter (Signed)
Patient walked in stating she needs "approval for pricing of Benicar" (per yellow info walk-in sheet). When conversing with patient, she stated she thinks she needs a prior authorization for this medication, that she only has 1 pill left. Informed patient to contact pharmacy to have them send of a prior authorization request, as it has the necessary information to initiate this request. Patient supplied with Benicar 20mg  samples, instructed to take 2 tablets daily (only 7 day supply available in office). Patient agreed with plan & voiced understanding. Will defer to W. Waddell to address prior authorization needs.

## 2013-12-22 ENCOUNTER — Telehealth: Payer: Self-pay | Admitting: Cardiovascular Disease

## 2013-12-22 ENCOUNTER — Other Ambulatory Visit: Payer: Self-pay | Admitting: *Deleted

## 2013-12-22 MED ORDER — OLMESARTAN MEDOXOMIL 20 MG PO TABS: 40.0000 mg | ORAL_TABLET | Freq: Every day | ORAL | Status: DC

## 2013-12-22 NOTE — Telephone Encounter (Signed)
[facility] is calling because they sent over a form for prior auth on Benicar for Lindsey Goodman to show why she still needs to take the medication in order for her insurance to pay for this .Marland Kitchen Please call    Thanks

## 2013-12-22 NOTE — Telephone Encounter (Signed)
Call to pharmacy and spoke w/ [facility].  Informed PA has not been completed and samples are available for pt, but unable to reach pt.  Asked if she would inform pt that samples available if she calls or comes by.  Agreed.

## 2013-12-22 NOTE — Telephone Encounter (Signed)
Spoke w/ Mariann Laster and PA not completed.  RN will leave samples for pt at front desk.  Two week supply available.  Call to pt and left message to call back when message received.

## 2013-12-22 NOTE — Telephone Encounter (Signed)
Forwarded to W Waddell °

## 2013-12-23 ENCOUNTER — Ambulatory Visit (HOSPITAL_COMMUNITY): Payer: Medicare Other

## 2013-12-23 ENCOUNTER — Other Ambulatory Visit (HOSPITAL_COMMUNITY): Payer: Self-pay | Admitting: *Deleted

## 2013-12-28 ENCOUNTER — Other Ambulatory Visit: Payer: Self-pay | Admitting: *Deleted

## 2013-12-28 MED ORDER — SPIRONOLACTONE 25 MG PO TABS: 25.0000 mg | ORAL_TABLET | Freq: Every day | ORAL | Status: DC

## 2013-12-29 ENCOUNTER — Telehealth: Payer: Self-pay | Admitting: *Deleted

## 2013-12-29 NOTE — Telephone Encounter (Signed)
Faxed PA request for Benicar 40 mg to [facility].

## 2013-12-29 NOTE — Telephone Encounter (Signed)
Pt was returning a call to [place] about her Benicar. Her insurance states that they want her to take a generic drug and she stated that she can not take anything but Benicar. She is out of town in [place] in a meeting and has limited signal. She stated that if it is urgent try to call her back but if it is not urgent call her back on Monday when she is back in town.  TK

## 2013-12-30 ENCOUNTER — Other Ambulatory Visit: Payer: Self-pay | Admitting: *Deleted

## 2013-12-30 MED ORDER — METOPROLOL TARTRATE 25 MG PO TABS: 25.0000 mg | ORAL_TABLET | Freq: Two times a day (BID) | ORAL | Status: DC

## 2013-12-30 MED ORDER — SPIRONOLACTONE 25 MG PO TABS: 25.0000 mg | ORAL_TABLET | Freq: Every day | ORAL | Status: DC

## 2014-01-02 ENCOUNTER — Encounter: Payer: Self-pay | Admitting: *Deleted

## 2014-01-10 ENCOUNTER — Other Ambulatory Visit: Payer: Self-pay | Admitting: *Deleted

## 2014-01-10 MED ORDER — OMEPRAZOLE 20 MG PO CPDR: 20.0000 mg | DELAYED_RELEASE_CAPSULE | Freq: Every day | ORAL | Status: DC

## 2014-01-10 NOTE — Telephone Encounter (Signed)
Rx refill sent to patients pharmacy  

## 2014-01-17 ENCOUNTER — Telehealth: Payer: Self-pay | Admitting: *Deleted

## 2014-01-17 NOTE — Telephone Encounter (Signed)
Left message for patient to return a call regarding her medications.

## 2014-01-18 ENCOUNTER — Other Ambulatory Visit: Payer: Self-pay | Admitting: *Deleted

## 2014-01-18 MED ORDER — METOPROLOL TARTRATE 100 MG PO TABS: 100.0000 mg | ORAL_TABLET | Freq: Two times a day (BID) | ORAL | Status: DC

## 2014-01-18 NOTE — Telephone Encounter (Signed)
Rx refill sent to patient pharmacy   

## 2014-01-23 ENCOUNTER — Other Ambulatory Visit: Payer: Self-pay | Admitting: *Deleted

## 2014-01-23 ENCOUNTER — Telehealth: Payer: Self-pay | Admitting: *Deleted

## 2014-01-23 MED ORDER — LEVOTHYROXINE SODIUM 100 MCG PO TABS: 100.0000 ug | ORAL_TABLET | Freq: Every day | ORAL | Status: DC

## 2014-01-23 MED ORDER — SPIRONOLACTONE 25 MG PO TABS: 25.0000 mg | ORAL_TABLET | Freq: Every day | ORAL | Status: DC

## 2014-01-23 MED ORDER — EZETIMIBE 10 MG PO TABS: 10.0000 mg | ORAL_TABLET | Freq: Every day | ORAL | Status: DC

## 2014-01-23 NOTE — Telephone Encounter (Signed)
Pt was returning your call. If she is not there leave a message.   TK

## 2014-01-24 ENCOUNTER — Telehealth: Payer: Self-pay | Admitting: *Deleted

## 2014-01-24 MED ORDER — OLMESARTAN MEDOXOMIL 20 MG PO TABS: 40.0000 mg | ORAL_TABLET | Freq: Every day | ORAL | Status: DC

## 2014-01-24 NOTE — Telephone Encounter (Signed)
Walk-In Message: "I just need 18 Benicar tablets until I see Dr. Claiborne Billings on May 8th!  I would like samples and then he will have to change my prescription since [facility] rejected appeal for this!!"  Samples given to front desk staff to give to pt. (#28 tabs)

## 2014-02-03 ENCOUNTER — Ambulatory Visit: Payer: Medicare Other | Admitting: Cardiovascular Disease

## 2014-02-07 ENCOUNTER — Telehealth: Payer: Self-pay | Admitting: *Deleted

## 2014-02-07 ENCOUNTER — Other Ambulatory Visit: Payer: Self-pay | Admitting: *Deleted

## 2014-02-07 NOTE — Telephone Encounter (Signed)
Omeprazole 20mg  Rx not covered with insurance. Called patient to recommend OTC omeprazole. Patient states she has already bought OTC prilosec and has f/up with Dr. Claiborne Billings on Friday 5/8

## 2014-02-09 ENCOUNTER — Encounter: Payer: Self-pay | Admitting: *Deleted

## 2014-02-10 ENCOUNTER — Ambulatory Visit (INDEPENDENT_AMBULATORY_CARE_PROVIDER_SITE_OTHER): Payer: Medicare Other | Admitting: Cardiovascular Disease

## 2014-02-10 ENCOUNTER — Encounter: Payer: Self-pay | Admitting: Cardiovascular Disease

## 2014-02-10 VITALS — BP 122/80 | HR 57 | Ht 65.0 in | Wt 146.9 lb

## 2014-02-10 DIAGNOSIS — E785 Hyperlipidemia, unspecified: Secondary | ICD-10-CM | POA: Diagnosis not present

## 2014-02-10 DIAGNOSIS — R5381 Other malaise: Secondary | ICD-10-CM

## 2014-02-10 DIAGNOSIS — R5383 Other fatigue: Secondary | ICD-10-CM | POA: Diagnosis not present

## 2014-02-10 DIAGNOSIS — I701 Atherosclerosis of renal artery: Secondary | ICD-10-CM | POA: Diagnosis not present

## 2014-02-10 DIAGNOSIS — I10 Essential (primary) hypertension: Secondary | ICD-10-CM | POA: Diagnosis not present

## 2014-02-10 DIAGNOSIS — Z79899 Other long term (current) drug therapy: Secondary | ICD-10-CM | POA: Diagnosis not present

## 2014-02-10 DIAGNOSIS — E039 Hypothyroidism, unspecified: Secondary | ICD-10-CM

## 2014-02-10 MED ORDER — IRBESARTAN 300 MG PO TABS: 300.0000 mg | ORAL_TABLET | Freq: Every day | ORAL | Status: DC

## 2014-02-10 NOTE — Patient Instructions (Signed)
Your physician recommends that you schedule a follow-up appointment in: 4 Months  Your physician recommends that you return for lab work in: 2 weeks BMP  Your physician has recommended you make the following change in your medication: Start Irbesartan 300 mg

## 2014-02-10 NOTE — Progress Notes (Signed)
Patient ID: [name], female   DOB: 02/01/1941, 73 y.o.   MRN: [id number]     HPI: CHARO PHILIPP is a 73 y.o. female presents for a 7 month followup cardiology evaluation.  Mrs.Honeywell is a 73 year old female who has a history of hypertension, hypothyroidism, hyperlipidemia, as well as GERD. Remotely, she developed a cough secondary to ACE inhibition and also developed hyponatremia secondary to hydrochlorothiazide. 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 She had equal to or greater than 60% diameter reduction in the right renal artery. Her left renal artery have normal patency. Her kidney sizes were normal. Since I last saw her, she has remained fairly asymptomatic. Her blood pressure has been well controlled with combination Benicar 40 mg as well as metoprolol tartrate 125 mg twice a day. She also takes Aldactone 25 mg daily. She has been on combination therapy with Zetia 10 mg and atorvastatin 40 mg for her hyperlipidemia. She has taken Prilosec 20 mg for her GERD. She denies recent chest pain.  Recently she has been having difficulty having her Benicar filled due to insurance reasons.  We had attempted to switch her by phone to ever starting 300 mg per apparently is out of town and this was never done.  She presents now for followup evaluation.  One month ago, followup blood work showed an excellent lipid status with a total cluster 145 triglycerides 73, HDL 44, and LDL cholesterol 86.  TSH was normal at 1.68.  Renal function was normal with a BUN of 13 and creatinine 0.89.  She had normal liver function studies.  She does admit to fatigue.  She denies difficulty with sleep.  However, upon further questioning, she is only allowing herself approximately 4-5 hours of sleep per night.    Past Medical History  Diagnosis Date  . GERD (gastroesophageal reflux disease)   .  Hypertension   . Hyperlipidemia   . Hypothyroidism   . Hx of echocardiogram 12/09/2010    EF 55% showed mild concrentric LVH with normal systolic function and grade 1 diastolic dysfunction. she had mild to moderate LA dilation, mild to moderate mitral annular calcification with moderate MR, and she had trival tricupid regurgitation.  Marland Kitchen Hx of Doppler ultrasound 12/09/2010    Renal duplexsuggested distal abdominal aorta segment in the origin of the right and left commom iliac arteries with elevated velocities consistent with at least 50% diameter reduction, 60% narrowing in the right renal artery with stable PSV at 210.    Past Surgical History  Procedure Laterality Date  . No prior surgery      Allergies  Allergen Reactions  . Amlodipine Besylate Swelling  . Prednisone     No appetite    Current Outpatient Prescriptions  Medication Sig Dispense Refill  . atorvastatin (LIPITOR) 40 MG tablet TAKE 1 TABLET BY MOUTH EVERY DAY  30 tablet  6  . ezetimibe (ZETIA) 10 MG tablet Take 1 tablet (10 mg total) by mouth daily.  30 tablet  6  . levothyroxine (SYNTHROID, LEVOTHROID) 100 MCG tablet Take 1 tablet (100 mcg total) by mouth daily before breakfast.  30 tablet  8  . metoprolol (LOPRESSOR) 100 MG tablet Take 1 tablet (100 mg total) by mouth 2 (two) times daily.  180 tablet  3  . metoprolol tartrate (LOPRESSOR) 25 MG tablet Take 1 tablet (25 mg total) by  mouth 2 (two) times daily.  90 tablet  6  . olmesartan (BENICAR) 20 MG tablet Take 2 tablets (40 mg total) by mouth daily.  28 tablet  0  . omeprazole (PRILOSEC) 20 MG capsule Take 1 capsule (20 mg total) by mouth daily.  90 capsule  3  . spironolactone (ALDACTONE) 25 MG tablet Take 1 tablet (25 mg total) by mouth daily.  30 tablet  6  . irbesartan (AVAPRO) 300 MG tablet Take 1 tablet (300 mg total) by mouth daily.  30 tablet  6   No current facility-administered medications for this visit.    History   Social History  . Marital Status:  Married    Spouse Name: N/A    Number of Children: N/A  . Years of Education: N/A   Occupational History  . Not on file.   Social History Main Topics  . Smoking status: Never Smoker   . Smokeless tobacco: Never Used  . Alcohol Use: No  . Drug Use: No  . Sexual Activity: Yes    Birth Control/ Protection: None   Other Topics Concern  . Not on file   Social History Narrative  . No narrative on file    Family History  Problem Relation Age of Onset  . Hypertension Mother   . Hypertension Father   . Stroke Father   . Colon cancer Neg Hx   . Stomach cancer Neg Hx    Social history is notable that she is married has 2 children. He does travel with her husband's work as well as vacation. She does exercise. There is no tobacco or alcohol use.   ROS General: Negative; No fevers, chills, or night sweats;  HEENT: Negative; No changes in vision or hearing, sinus congestion, difficulty swallowing Pulmonary: Negative; No cough, wheezing, shortness of breath, hemoptysis Cardiovascular: Negative; No chest pain, presyncope, syncope, palpatations GI: Negative; No nausea, vomiting, diarrhea, or abdominal pain GU: Negative; No dysuria, hematuria, or difficulty voiding Musculoskeletal: Negative; no myalgias, joint pain, or weakness Hematologic/Oncology: Mild bruisability; no, bleeding Endocrine: Positive for hypothyroidism; no heat/cold intolerance; no diabetes Neuro: Negative; no changes in balance, headaches Skin: Negative; No rashes or skin lesions Psychiatric: Negative; No behavioral problems, depression Sleep: Positive for inadequate sleep duration and she is consistently getting approximately 5 hours sleep per night; No snoring, daytime sleepiness, hypersomnolence, bruxism, restless legs, hypnogognic hallucinations, no cataplexy Other comprehensive 14 point system review is negative.  PE BP 122/80  Pulse 57  Ht 5\' 5"  (1.651 m)  Wt 146 lb 14.4 oz (66.633 kg)  BMI 24.45 kg/m2    General: Alert, oriented, no distress.  Skin: normal turgor, no rashes, small area of ecchymosis in the region of the wrist HEENT: Normocephalic, atraumatic. Pupils round and reactive; sclera anicteric;no lid lag.  Nose without nasal septal hypertrophy Mouth/Parynx benign; Mallinpatti scale 2 Neck: No JVD, no carotid bruits with normal upstroke Lungs: clear to ausculatation and percussion; no wheezing or rales Chest wall: Nontender to palpation Heart: PMI not displaced; RRR, s1 s2 normal 1/6 sem; no S3 or S4 gallop.  No diastolic murmur.  No rubs, thrills or heaves. Abdomen: Abdominal bruit ; soft, nontender; no hepatosplenomehaly, BS+; abdominal aorta nontender and not dilated by palpation. Back: No CVA tenderness Pulses 2+ Extremities: no clubbing cyanosis or edema, Homan's sign negative  Neurologic: grossly nonfocal Psychologic: normal affect and mood.  ECG protheses independently read by me): Sinus bradycardia 57 beats per minute.  One isolated PVC.  Normal intervals.  Prior ECG: Sinus bradycardia 57 beats per minute. Normal intervals. No significant ST changes.  LABS:  BMET    Component Value Date/Time   NA 135 12/14/2013 1009   K 5.2 12/14/2013 1009   CL 98 12/14/2013 1009   CO2 27 12/14/2013 1009   GLUCOSE 100* 12/14/2013 1009   BUN 13 12/14/2013 1009   CREATININE 0.89 12/14/2013 1009   CALCIUM 10.0 12/14/2013 1009     Hepatic Function Panel     Component Value Date/Time   PROT 7.0 12/14/2013 1009     CBC    Component Value Date/Time   WBC 5.9 12/14/2013 1006   RBC 4.57 12/14/2013 1006   HGB 13.6 12/14/2013 1006   HCT 40.8 12/14/2013 1006   PLT 285 12/14/2013 1006   MCV 89.3 12/14/2013 1006   MCH 29.8 12/14/2013 1006   MCHC 33.3 12/14/2013 1006   RDW 14.0 12/14/2013 1006     BNP No results found for this basename: probnp    Lipid Panel     Component Value Date/Time   CHOL 145 12/14/2013 1015   TRIG 73 12/14/2013 1015   HDL 44 12/14/2013 1015   CHOLHDL 3.3  12/14/2013 1015   VLDL 15 12/14/2013 1015   LDLCALC 86 12/14/2013 1015     RADIOLOGY: No results found.    ASSESSMENT AND PLAN: Ms. 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 Her last renal duplex scan in March 2013 at which time her PSV was 261 with EDV 55 on the right. He was also found to have narrowing in the distal segment of her aorta. Her blood pressure today is well controlled.  She has been feeling well with excellent blood pressure control on Benicar 40 mg, but insurance will no longer provide this.  For this reason, I have written her a prescription for generic irbesartan 3 mg daily.  Her renal function was recently normal on her followup blood test.  2 weeks, we will recheck a beam that to reassess her renal function with a change in ARB therapy.  She is tolerating her atorvastatin 40 mg and Zetia 10 mg for hyperlipidemia with total cholesterol 145 and LDL claustral 86.  She is on Synthroid replacement 100 mcg for hypothyroidism.  She's not having any palpitations on a low-dose beta blocker, which is also helping her blood pressure.  She will be traveling some this summer.  I did discuss her fatigue.  I believe this is due to inadequate sleep time since the majority of nights.  She is getting 5 hours sleep or less.  I discussed with her recent data concerning sleep deprivation.  Patient to consistently get less than 5 hours sleep per night and is suggestive of increased mortality.  We discussed an ideal sleep duration of 8 hours, but she should at least try to get 7 hours per night at all possible. I will see her in 4 months for reassessment and further recommendations will be made at that time.     Troy Sine, MD, [facility]  02/10/2014 9:53 AM

## 2014-02-16 ENCOUNTER — Encounter: Payer: Self-pay | Admitting: Cardiovascular Disease

## 2014-03-03 ENCOUNTER — Telehealth: Payer: Self-pay | Admitting: *Deleted

## 2014-03-03 NOTE — Telephone Encounter (Signed)
Patient dropped off note requesting that lab slip be sent to lab to get blood work [facility] requested she get after medication change. Called and left message he order is already in the computer and she can go anytime to have drawn.

## 2014-03-07 ENCOUNTER — Other Ambulatory Visit: Payer: Self-pay

## 2014-03-07 LAB — BASIC METABOLIC PANEL
BUN: 12 mg/dL (ref 6–23)
CO2: 28 mEq/L (ref 19–32)
Calcium: 10.7 mg/dL — ABNORMAL HIGH (ref 8.4–10.5)
Chloride: 100 mEq/L (ref 96–112)
Creat: 0.88 mg/dL (ref 0.50–1.10)
Glucose, Bld: 108 mg/dL — ABNORMAL HIGH (ref 70–99)
Potassium: 4.9 mEq/L (ref 3.5–5.3)
Sodium: 137 mEq/L (ref 135–145)

## 2014-03-07 MED ORDER — ATORVASTATIN CALCIUM 40 MG PO TABS: 40.0000 mg | ORAL_TABLET | Freq: Every day | ORAL | Status: DC

## 2014-03-07 NOTE — Telephone Encounter (Signed)
Rx was sent to pharmacy electronically. 

## 2014-03-16 ENCOUNTER — Encounter: Payer: Self-pay | Admitting: *Deleted

## 2014-06-21 ENCOUNTER — Ambulatory Visit: Payer: Medicare Other | Admitting: Cardiovascular Disease

## 2014-08-27 ENCOUNTER — Other Ambulatory Visit: Payer: Self-pay | Admitting: Cardiovascular Disease

## 2014-08-28 NOTE — Telephone Encounter (Signed)
Rx was sent to pharmacy electronically. 

## 2014-09-05 ENCOUNTER — Encounter: Payer: Self-pay | Admitting: Cardiovascular Disease

## 2014-09-05 ENCOUNTER — Ambulatory Visit (INDEPENDENT_AMBULATORY_CARE_PROVIDER_SITE_OTHER): Payer: Medicare Other | Admitting: Cardiovascular Disease

## 2014-09-05 VITALS — BP 140/80 | HR 54 | Ht 65.0 in | Wt 145.7 lb

## 2014-09-05 DIAGNOSIS — I701 Atherosclerosis of renal artery: Secondary | ICD-10-CM

## 2014-09-05 DIAGNOSIS — E785 Hyperlipidemia, unspecified: Secondary | ICD-10-CM

## 2014-09-05 DIAGNOSIS — I10 Essential (primary) hypertension: Secondary | ICD-10-CM

## 2014-09-05 DIAGNOSIS — Z79899 Other long term (current) drug therapy: Secondary | ICD-10-CM

## 2014-09-05 DIAGNOSIS — E784 Other hyperlipidemia: Secondary | ICD-10-CM | POA: Diagnosis not present

## 2014-09-05 DIAGNOSIS — R001 Bradycardia, unspecified: Secondary | ICD-10-CM | POA: Insufficient documentation

## 2014-09-05 DIAGNOSIS — E039 Hypothyroidism, unspecified: Secondary | ICD-10-CM

## 2014-09-05 DIAGNOSIS — R5382 Chronic fatigue, unspecified: Secondary | ICD-10-CM

## 2014-09-05 NOTE — Progress Notes (Signed)
Patient ID: Lindsey Goodman, female   DOB: 04-12-1941, 73 y.o.   MRN: [id number]    HPI: Lindsey Goodman is a 73 y.o. female presents for a 7 month followup cardiology evaluation.  Lindsey Goodman has a history of hypertension, hypothyroidism, hyperlipidemia, as well as GERD. Remotely, she developed a cough secondary to ACE inhibition and also developed hyponatremia secondary to hydrochlorothiazide. 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 She had equal to or greater than 60% diameter reduction in the right renal artery. Her left renal artery have normal patency. Her kidney sizes were normal.  It was confusion with her current medications today since her list was not accurate.  I believe she has been on Benicar 40 mg, Lopressor 125 mg twice a day and Aldactone 25 mg daily.   She has been on combination therapy with Zetia 10 mg and atorvastatin 40 mg for her hyperlipidemia. She has taken Prilosec 20 mg for her GERD.   Currently of this year blood work showed an excellent lipid status with a total cholesterol 145, triglycerides 73, HDL 44, and LDL cholesterol 86.  TSH was normal at 1.68.  Renal function was normal with a BUN of 13 and creatinine 0.89.  She had normal liver function studies.  I last saw her, she was admitting to significant fatigue.  She is only sleeping 4-5 hours per night.  At that time, I stressed with her the importance of trying to obtain 7-8 hours of sleep per night.  I also discussed with her data suggesting increased mortality associated with sleep deprivation of less than 5 hours per night.  She now states that she goes to bed at 1 AM and oftentimes wakes up at 8 AM.  She denies chest pain.  She is unaware of palpitations.  She denies presyncope or syncope.  She does have history of hypothyroidism and is taking levothyroxine.  She presents for a follow-up  evaluation.  Past Medical History  Diagnosis Date  . GERD (gastroesophageal reflux disease)   . Hypertension   . Hyperlipidemia   . Hypothyroidism   . Hx of echocardiogram 12/09/2010    EF 55% showed mild concrentric LVH with normal systolic function and grade 1 diastolic dysfunction. she had mild to moderate LA dilation, mild to moderate mitral annular calcification with moderate MR, and she had trival tricupid regurgitation.  Marland Kitchen Hx of Doppler ultrasound 12/09/2010    Renal duplexsuggested distal abdominal aorta segment in the origin of the right and left commom iliac arteries with elevated velocities consistent with at least 50% diameter reduction, 60% narrowing in the right renal artery with stable PSV at 210.    Past Surgical History  Procedure Laterality Date  . No prior surgery      Allergies  Allergen Reactions  . Amlodipine Besylate Swelling  . Prednisone     No appetite    Current Outpatient Prescriptions  Medication Sig Dispense Refill  . atorvastatin (LIPITOR) 40 MG tablet Take 1 tablet (40 mg total) by mouth daily at 6 PM. 30 tablet 11  . ezetimibe (ZETIA) 10 MG tablet Take 1 tablet (10 mg total) by mouth daily. 30 tablet 6  . irbesartan (AVAPRO) 300 MG tablet TAKE 1 TABLET (300 MG TOTAL) BY MOUTH DAILY. 30 tablet 6  . levothyroxine (SYNTHROID, LEVOTHROID) 100 MCG tablet Take 1 tablet (100 mcg total) by mouth  daily before breakfast. 30 tablet 8  . metoprolol (LOPRESSOR) 100 MG tablet Take 1 tablet (100 mg total) by mouth 2 (two) times daily. 180 tablet 3  . metoprolol tartrate (LOPRESSOR) 25 MG tablet Take 1 tablet (25 mg total) by mouth 2 (two) times daily. 90 tablet 6  . olmesartan (BENICAR) 20 MG tablet Take 2 tablets (40 mg total) by mouth daily. 28 tablet 0  . spironolactone (ALDACTONE) 25 MG tablet Take 1 tablet (25 mg total) by mouth daily. 30 tablet 6   No current facility-administered medications for this visit.    History   Social History  . Marital  Status: Married    Spouse Name: N/A    Number of Children: N/A  . Years of Education: N/A   Occupational History  . Not on file.   Social History Main Topics  . Smoking status: Never Smoker   . Smokeless tobacco: Never Used  . Alcohol Use: No  . Drug Use: No  . Sexual Activity: Yes    Birth Control/ Protection: None   Other Topics Concern  . Not on file   Social History Narrative    Family History  Problem Relation Age of Onset  . Hypertension Mother   . Hypertension Father   . Stroke Father   . Colon cancer Neg Hx   . Stomach cancer Neg Hx    Social history is notable that she is married has 2 children. He does travel with her husband's work as well as vacation. She does exercise. There is no tobacco or alcohol use.   ROS General: Negative; No fevers, chills, or night sweats;  HEENT: Negative; No changes in vision or hearing, sinus congestion, difficulty swallowing Pulmonary: Negative; No cough, wheezing, shortness of breath, hemoptysis Cardiovascular: Negative; No chest pain, presyncope, syncope, palpatations GI: Negative; No nausea, vomiting, diarrhea, or abdominal pain GU: Negative; No dysuria, hematuria, or difficulty voiding Musculoskeletal: Negative; no myalgias, joint pain, or weakness Hematologic/Oncology: Mild bruisability; no, bleeding Endocrine: Positive for hypothyroidism; no heat/cold intolerance; no diabetes Neuro: Negative; no changes in balance, headaches Skin: Negative; No rashes or skin lesions Psychiatric: Negative; No behavioral problems, depression Sleep: Positive for previous history of inadequate sleep duration; No snoring, daytime sleepiness, hypersomnolence, bruxism, restless legs, hypnogognic hallucinations, no cataplexy Other comprehensive 14 point system review is negative.  PE BP 140/80 mmHg  Pulse 54  Ht 5\' 5"  (1.651 m)  Wt 145 lb 11.2 oz (66.089 kg)  BMI 24.25 kg/m2  General: Alert, oriented, no distress.  Skin: normal turgor, no  rashes, small area of ecchymosis in the region of the wrist HEENT: Normocephalic, atraumatic. Pupils round and reactive; sclera anicteric;no lid lag.  Nose without nasal septal hypertrophy Mouth/Parynx benign; Mallinpatti scale 2 Neck: No JVD, no carotid bruits with normal upstroke Lungs: clear to ausculatation and percussion; no wheezing or rales Chest wall: Nontender to palpation Heart: PMI not displaced; RRR, s1 s2 normal 1/6 sem; no S3 or S4 gallop.  No diastolic murmur.  No rubs, thrills or heaves. Abdomen: Harsh mid abdominal bruit ; soft, nontender; no hepatosplenomehaly, BS+; abdominal aorta nontender and not dilated by palpation. Back: No CVA tenderness Pulses 2+ Extremities: no clubbing cyanosis or edema, Homan's sign negative  Neurologic: grossly nonfocal Psychologic: normal affect and mood.  ECG (independently read by me) : Sinus bradycardia at 54 bpm.  Nonsignificant ST-T changes  May 2015 ECG  (independently read by me): Sinus bradycardia 57 beats per minute.  One isolated PVC.  Normal intervals.  Prior ECG: Sinus bradycardia 57 beats per minute. Normal intervals. No significant ST changes.  LABS:  BMET    Component Value Date/Time   NA 137 03/07/2014 1016   K 4.9 03/07/2014 1016   CL 100 03/07/2014 1016   CO2 28 03/07/2014 1016   GLUCOSE 108* 03/07/2014 1016   BUN 12 03/07/2014 1016   CREATININE 0.88 03/07/2014 1016   CALCIUM 10.7* 03/07/2014 1016     Hepatic Function Panel     Component Value Date/Time   PROT 7.0 12/14/2013 1009     CBC    Component Value Date/Time   WBC 5.9 12/14/2013 1006   RBC 4.57 12/14/2013 1006   HGB 13.6 12/14/2013 1006   HCT 40.8 12/14/2013 1006   PLT 285 12/14/2013 1006   MCV 89.3 12/14/2013 1006   MCH 29.8 12/14/2013 1006   MCHC 33.3 12/14/2013 1006   RDW 14.0 12/14/2013 1006     BNP No results found for: PROBNP  Lipid Panel     Component Value Date/Time   CHOL 145 12/14/2013 1015   TRIG 73 12/14/2013 1015    HDL 44 12/14/2013 1015   CHOLHDL 3.3 12/14/2013 1015   VLDL 15 12/14/2013 1015   LDLCALC 86 12/14/2013 1015     RADIOLOGY: No results found.    ASSESSMENT AND PLAN: Ms. 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  A renal duplex scan in March 2013 revealed her PSV was 261 with EDV 55 on the right. He was also found to have narrowing in the distal segment of her aorta.  She is confused about her medication.  However, I believe she is taking Benicar 40 mg, Lopressor 125 mg twice a day and Aldactone 25 mg.  I will need to obtain laboratory to see how her renal function is .  Prior to making any further adjustments.  She did have some blood pressure lability with her initial blood pressure 140/80 when taken by the nurse and on repeat when taken by me at 168/80.  She is on combination therapy for hyperlipidemia with target LDL less than 70 and laboratory concerning lipid studies will also be taken in addition to thyroid studies to further evaluate her levothyroxin 100 mg dosing for her hypothyroidism.  She is bradycardic but is not having any symptoms of dizziness or lightheadedness.  She's not having any ectopy.  She will monitor her blood pressure closely at home.  She will contact our office if her medications are different and we believe she is on so that adjustments may need to be made.  She will return in 3 months for follow-up evaluation.  Time spent: 25 minutes   Troy Sine, MD, [facility]  09/05/2014 10:59 AM

## 2014-09-05 NOTE — Patient Instructions (Addendum)
Your physician recommends that you return for lab work fasting.  Your physician recommends that you schedule a follow-up appointment in: 3 months.

## 2014-09-06 LAB — CBC
HCT: 41.2 % (ref 36.0–46.0)
Hemoglobin: 14.2 g/dL (ref 12.0–15.0)
MCH: 30.2 pg (ref 26.0–34.0)
MCHC: 34.5 g/dL (ref 30.0–36.0)
MCV: 87.7 fL (ref 78.0–100.0)
MPV: 10.2 fL (ref 9.4–12.4)
Platelets: 284 10*3/uL (ref 150–400)
RBC: 4.7 MIL/uL (ref 3.87–5.11)
RDW: 13.9 % (ref 11.5–15.5)
WBC: 6.8 10*3/uL (ref 4.0–10.5)

## 2014-09-06 LAB — LIPID PANEL
Cholesterol: 141 mg/dL (ref 0–200)
HDL: 46 mg/dL (ref >39)
LDL Cholesterol: 81 mg/dL (ref 0–99)
Total CHOL/HDL Ratio: 3.1 Ratio
Triglycerides: 71 mg/dL (ref <150)
VLDL: 14 mg/dL (ref 0–40)

## 2014-09-06 LAB — TSH: TSH: 1.282 u[IU]/mL (ref 0.350–4.500)

## 2014-09-06 LAB — COMPREHENSIVE METABOLIC PANEL
ALT: 16 U/L (ref 0–35)
AST: 22 U/L (ref 0–37)
Albumin: 4.4 g/dL (ref 3.5–5.2)
Alkaline Phosphatase: 88 U/L (ref 39–117)
BUN: 14 mg/dL (ref 6–23)
CO2: 25 mEq/L (ref 19–32)
Calcium: 10 mg/dL (ref 8.4–10.5)
Chloride: 102 mEq/L (ref 96–112)
Creat: 0.8 mg/dL (ref 0.50–1.10)
Glucose, Bld: 109 mg/dL — ABNORMAL HIGH (ref 70–99)
Potassium: 4.6 mEq/L (ref 3.5–5.3)
Sodium: 138 mEq/L (ref 135–145)
Total Bilirubin: 0.6 mg/dL (ref 0.2–1.2)
Total Protein: 7.1 g/dL (ref 6.0–8.3)

## 2014-09-18 ENCOUNTER — Other Ambulatory Visit: Payer: Self-pay

## 2014-09-18 MED ORDER — METOPROLOL TARTRATE 100 MG PO TABS: 100.0000 mg | ORAL_TABLET | Freq: Two times a day (BID) | ORAL | Status: DC

## 2014-09-18 NOTE — Telephone Encounter (Signed)
Rx sent to pharmacy   

## 2014-09-19 ENCOUNTER — Other Ambulatory Visit: Payer: Self-pay

## 2014-09-19 MED ORDER — LEVOTHYROXINE SODIUM 100 MCG PO TABS: 100.0000 ug | ORAL_TABLET | Freq: Every day | ORAL | Status: DC

## 2014-09-19 NOTE — Telephone Encounter (Signed)
Rx sent to pharmacy   

## 2014-09-26 ENCOUNTER — Encounter: Payer: Self-pay | Admitting: *Deleted

## 2014-11-02 ENCOUNTER — Other Ambulatory Visit: Payer: Self-pay | Admitting: Cardiovascular Disease

## 2014-11-18 ENCOUNTER — Other Ambulatory Visit: Payer: Self-pay | Admitting: Cardiovascular Disease

## 2014-11-20 NOTE — Telephone Encounter (Signed)
Rx(s) sent to pharmacy electronically.  

## 2014-12-11 ENCOUNTER — Telehealth: Payer: Self-pay | Admitting: *Deleted

## 2014-12-11 ENCOUNTER — Encounter: Payer: Self-pay | Admitting: Cardiovascular Disease

## 2014-12-11 ENCOUNTER — Ambulatory Visit (INDEPENDENT_AMBULATORY_CARE_PROVIDER_SITE_OTHER): Payer: Medicare Other | Admitting: Cardiovascular Disease

## 2014-12-11 VITALS — BP 168/86 | HR 60 | Ht 65.0 in | Wt 146.6 lb

## 2014-12-11 DIAGNOSIS — I10 Essential (primary) hypertension: Secondary | ICD-10-CM

## 2014-12-11 DIAGNOSIS — Z79899 Other long term (current) drug therapy: Secondary | ICD-10-CM

## 2014-12-11 DIAGNOSIS — K219 Gastro-esophageal reflux disease without esophagitis: Secondary | ICD-10-CM

## 2014-12-11 DIAGNOSIS — E785 Hyperlipidemia, unspecified: Secondary | ICD-10-CM

## 2014-12-11 DIAGNOSIS — I701 Atherosclerosis of renal artery: Secondary | ICD-10-CM

## 2014-12-11 DIAGNOSIS — E038 Other specified hypothyroidism: Secondary | ICD-10-CM

## 2014-12-11 MED ORDER — OLMESARTAN MEDOXOMIL 40 MG PO TABS: 40.0000 mg | ORAL_TABLET | Freq: Every day | ORAL | Status: DC

## 2014-12-11 MED ORDER — AMLODIPINE BESYLATE 5 MG PO TABS: 5.0000 mg | ORAL_TABLET | Freq: Every day | ORAL | Status: DC

## 2014-12-11 NOTE — Progress Notes (Signed)
Patient ID: Lindsey Goodman, female   DOB: [birth year], 74 y.o.   MRN: [id number]    HPI: Lindsey Goodman is a 74 y.o. female presents for a 3 month followup cardiology evaluation.  Mrs. Manrique has a history of hypertension, hypothyroidism, hyperlipidemia, as well as GERD. Remotely, she developed a cough secondary to ACE inhibition and also developed hyponatremia secondary to hydrochlorothiazide. In March 2013, a renal duplex study demonstrated a greater than 60% diameter reduction in the distal aorta at the origin of the right and left common iliac arteries, and had equal to or greater than 60% diameter reduction in the right renal artery. Her left renal artery have normal patency. Her kidney sizes were normal.  There was confusion with reference to her medications.  She had thought she was still on Benicar which she had tolerated well in the past.  Apparently her insurance company had recommended that she switch to a generic and for the past 3 months she has been on irbesartan 300 mg daily in place of Benicar 40 mg.  she also has been taking Lopressor 125 mg twice a day and Aldactone 25 mg daily.   She has been on combination therapy with Zetia 10 mg and atorvastatin 40 mg for her hyperlipidemia. She has taken Prilosec 20 mg for her GERD.   Recently she had noticed some allergies.  She is not take any medication for this.  She denies chest pain.  She is unaware of palpitations.  She denies any ankle swelling.  There is no presyncope or syncope.  She has a history of hypothyroidism and is taking levothyroxine.  She had laboratory in early December.  I reviewed these in detail with her.  She has been on atorvastatin 40 mg and Zetia 10 mg a hyperlipidemia.  Cholesterol was 141, LDL 81, HDL 46, TG 71.  CBC was stable.  Potassium was normal at 4.6.  Thyroid function studies were normal.  Past Medical History  Diagnosis Date  . GERD (gastroesophageal reflux disease)   . Hypertension   . Hyperlipidemia   .  Hypothyroidism   . Hx of echocardiogram 12/09/2010    EF 55% showed mild concrentric LVH with normal systolic function and grade 1 diastolic dysfunction. she had mild to moderate LA dilation, mild to moderate mitral annular calcification with moderate MR, and she had trival tricupid regurgitation.  Marland Kitchen Hx of Doppler ultrasound 12/09/2010    Renal duplexsuggested distal abdominal aorta segment in the origin of the right and left commom iliac arteries with elevated velocities consistent with at least 50% diameter reduction, 60% narrowing in the right renal artery with stable PSV at 210.    Past Surgical History  Procedure Laterality Date  . No prior surgery      Allergies  Allergen Reactions  . Amlodipine Besylate Swelling  . Prednisone     No appetite    Current Outpatient Prescriptions  Medication Sig Dispense Refill  . atorvastatin (LIPITOR) 40 MG tablet Take 1 tablet (40 mg total) by mouth daily at 6 PM. 30 tablet 11  . ezetimibe (ZETIA) 10 MG tablet Take 1 tablet (10 mg total) by mouth daily. 30 tablet 6  . irbesartan (AVAPRO) 300 MG tablet Take 1 tablet by mouth daily.  6  . levothyroxine (SYNTHROID, LEVOTHROID) 100 MCG tablet Take 1 tablet (100 mcg total) by mouth daily before breakfast. 30 tablet 8  . metoprolol (LOPRESSOR) 100 MG tablet Take 1 tablet (100 mg total) by mouth 2 (two) times  daily. 30 tablet 8  . metoprolol tartrate (LOPRESSOR) 25 MG tablet TAKE 1 TABLET (25 MG TOTAL) BY MOUTH 2 (TWO) TIMES DAILY. 180 tablet 0  . olmesartan (BENICAR) 20 MG tablet Take 2 tablets (40 mg total) by mouth daily. 28 tablet 0  . spironolactone (ALDACTONE) 25 MG tablet TAKE 1 TABLET (25 MG TOTAL) BY MOUTH DAILY. 30 tablet 9   No current facility-administered medications for this visit.    History   Social History  . Marital Status: Married    Spouse Name: N/A  . Number of Children: N/A  . Years of Education: N/A   Occupational History  . Not on file.   Social History Main Topics    . Smoking status: Never Smoker   . Smokeless tobacco: Never Used  . Alcohol Use: No  . Drug Use: No  . Sexual Activity: Yes    Birth Control/ Protection: None   Other Topics Concern  . Not on file   Social History Narrative    Family History  Problem Relation Age of Onset  . Hypertension Mother   . Hypertension Father   . Stroke Father   . Colon cancer Neg Hx   . Stomach cancer Neg Hx    Social history is notable that she is married has 2 children. He does travel with her husband's work as well as vacation. She does exercise. There is no tobacco or alcohol use.   ROS General: Negative; No fevers, chills, or night sweats;  HEENT: Negative; No changes in vision or hearing, sinus congestion, difficulty swallowing Pulmonary: Negative; No cough, wheezing, shortness of breath, hemoptysis Cardiovascular: Negative; No chest pain, presyncope, syncope, palpatations GI: Negative; No nausea, vomiting, diarrhea, or abdominal pain GU: Negative; No dysuria, hematuria, or difficulty voiding Musculoskeletal: Negative; no myalgias, joint pain, or weakness Hematologic/Oncology: Mild bruisability; no, bleeding Endocrine: Positive for hypothyroidism; no heat/cold intolerance; no diabetes Neuro: Negative; no changes in balance, headaches Skin: Negative; No rashes or skin lesions Psychiatric: Negative; No behavioral problems, depression Sleep: Positive for previous history of inadequate sleep duration; No snoring, daytime sleepiness, hypersomnolence, bruxism, restless legs, hypnogognic hallucinations, no cataplexy Other comprehensive 14 point system review is negative.  PE BP 168/86 mmHg  Pulse 60  Ht _0  (1.651 m)  Wt 146 lb 9.6 oz (66.497 kg)  BMI 24.40 kg/m2  General: Alert, oriented, no distress.  Skin: normal turgor, no rashes, small area of ecchymosis in the region of the wrist HEENT: Normocephalic, atraumatic. Pupils round and reactive; sclera anicteric;no lid lag.  Nose without  nasal septal hypertrophy Mouth/Parynx benign; Mallinpatti scale 2 Neck: No JVD, no carotid bruits with normal upstroke Lungs: clear to ausculatation and percussion; no wheezing or rales Chest wall: Nontender to palpation Heart: PMI not displaced; RRR, s1 s2 normal 1/6 sem; no S3 or S4 gallop.  No diastolic murmur.  No rubs, thrills or heaves. Abdomen: Harsh mid abdominal bruit ; soft, nontender; no hepatosplenomehaly, BS+; abdominal aorta nontender and not dilated by palpation. Back: No CVA tenderness Pulses 2+ Extremities: no clubbing cyanosis or edema, Homan's sign negative  Neurologic: grossly nonfocal Psychologic: normal affect and mood.  ECG (independently read by me): Normal sinus rhythm at 60 bpm.  Nonspecific ST changes.  Normal intervals.  09/05/2014 ECG (independently read by me) : Sinus bradycardia at 54 bpm.  Nonsignificant ST-T changes  May 2015 ECG  (independently read by me): Sinus bradycardia 57 beats per minute.  One isolated PVC.  Normal intervals.  Prior ECG: Sinus  bradycardia 57 beats per minute. Normal intervals. No significant ST changes.  LABS:  BMET  BMP Latest Ref Rng 09/05/2014 03/07/2014 12/14/2013  Glucose 70 - 99 mg/dL 109(H) 108(H) 100(H)  BUN 6 - 23 mg/dL _0 Creatinine 0.50 - 1.10 mg/dL 0.80 0.88 0.89  Sodium 135 - 145 mEq/L 138 137 135  Potassium 3.5 - 5.3 mEq/L 4.6 4.9 5.2  Chloride 96 - 112 mEq/L 102 100 98  CO2 19 - 32 mEq/L _1 Calcium 8.4 - 10.5 mg/dL 10.0 10.7(H) 10.0     Hepatic Function Panel   Hepatic Function Latest Ref Rng 09/05/2014 12/14/2013  Total Protein 6.0 - 8.3 g/dL 7.1 7.0  Albumin 3.5 - 5.2 g/dL 4.4 4.5  AST 0 - 37 U/L 22 23  ALT 0 - 35 U/L 16 17  Alk Phosphatase 39 - 117 U/L 88 66  Total Bilirubin 0.2 - 1.2 mg/dL 0.6 0.6     CBC  CBC Latest Ref Rng 09/05/2014 12/14/2013 07/27/2013  WBC 4.0 - 10.5 K/uL 6.8 5.9 6.8  Hemoglobin 12.0 - 15.0 g/dL 14.2 13.6 14.0  Hematocrit 36.0 - 46.0 % 41.2 40.8 41.4    Platelets 150 - 400 K/uL 284 285 305     BNP No results found for: PROBNP  Lipid Panel     Component Value Date/Time   CHOL 141 09/05/2014 1024   TRIG 71 09/05/2014 1024   HDL 46 09/05/2014 1024   CHOLHDL 3.1 09/05/2014 1024   VLDL 14 09/05/2014 1024   LDLCALC 81 09/05/2014 1024     RADIOLOGY: No results found.    ASSESSMENT AND PLAN: Ms. 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  A renal duplex scan in March 2013 revealed her PSV was 261 with EDV 55 on the right. He was also found to have narrowing in the distal segment of her aorta.  She is confused about her medication.  After seeing me, she went back to her car and where she had her medications.  She actually was taking herbs started in place of Benicar.  She has tolerated Benicar very well in the past.  I recommending we switch her back to Benicar since her blood pressure today is elevated.  I had planned to initiate very low-dose amlodipine.  However, she states that she has had difficulty with this medication in the past.  She does not have any edema.  Presently  I will cancel this prescription and just continue her back on the Benicar.  I reviewed with her laboratory from December.  She is tolerating atorvastatin and Zetia for hyperlipidemia.  Her GERD is stable.  Her thyroid function studies are stable on her current dose of levothyroxine.  I am scheduling her for a renal duplex scan to reassess her distal aortic stenosis as well as renal artery stenosis.  She will monitor her blood pressure closely. In  6 weeks  Repeat lab work will be obtained.  I will see her in 2 months for cardiology reevaluation.  Time spent: 25 minutes   Troy Sine, MD, [facility]  12/11/2014 8:27 AM

## 2014-12-11 NOTE — Patient Instructions (Addendum)
Your physician has requested that you have a renal artery duplex. During this test, an ultrasound is used to evaluate blood flow to the kidneys. Allow one hour for this exam. Do not eat after midnight the day before and avoid carbonated beverages. Take your medications as you usually do.  Your physician recommends that you return for lab work in: 6 weeks.  Your physician has recommended you make the following change in your medication: restart the Benicar. A new prescription has been sent to your pharmacy.  Your physician recommends that you schedule a follow-up appointment in: 6 weeks.

## 2014-12-11 NOTE — Telephone Encounter (Signed)
Called [employer] and got approval for  Benicar 40 mg.  Coverage starting from February 16th thru to March 7 th 2017. [facility] [address] pharmacy notified.

## 2015-01-07 ENCOUNTER — Other Ambulatory Visit: Payer: Self-pay | Admitting: Cardiovascular Disease

## 2015-01-08 ENCOUNTER — Ambulatory Visit (HOSPITAL_COMMUNITY)
Admission: RE | Admit: 2015-01-08 | Discharge: 2015-01-08 | Disposition: A | Discharge status: Home / Self Care | Payer: PPO | Source: Ambulatory Visit | Attending: Cardiology | Admitting: Cardiology

## 2015-01-08 DIAGNOSIS — I701 Atherosclerosis of renal artery: Secondary | ICD-10-CM

## 2015-01-08 DIAGNOSIS — I10 Essential (primary) hypertension: Secondary | ICD-10-CM | POA: Diagnosis not present

## 2015-01-08 NOTE — Progress Notes (Signed)
Renal Duplex Completed. Elfrieda Espino, BS, RDMS, RVT  

## 2015-01-08 NOTE — Telephone Encounter (Signed)
Rx has been sent to the pharmacy electronically. ° °

## 2015-01-15 ENCOUNTER — Other Ambulatory Visit: Payer: Self-pay | Admitting: Cardiovascular Disease

## 2015-01-20 LAB — CBC
HCT: 44.1 % (ref 36.0–46.0)
Hemoglobin: 15 g/dL (ref 12.0–15.0)
MCH: 30.7 pg (ref 26.0–34.0)
MCHC: 34 g/dL (ref 30.0–36.0)
MCV: 90.2 fL (ref 78.0–100.0)
MPV: 10.5 fL (ref 8.6–12.4)
Platelets: 271 10*3/uL (ref 150–400)
RBC: 4.89 MIL/uL (ref 3.87–5.11)
RDW: 13.7 % (ref 11.5–15.5)
WBC: 6 10*3/uL (ref 4.0–10.5)

## 2015-01-20 LAB — COMPREHENSIVE METABOLIC PANEL
ALT: 11 U/L (ref 0–35)
AST: 17 U/L (ref 0–37)
Albumin: 4.4 g/dL (ref 3.5–5.2)
Alkaline Phosphatase: 75 U/L (ref 39–117)
BUN: 16 mg/dL (ref 6–23)
CO2: 25 mEq/L (ref 19–32)
Calcium: 10.7 mg/dL — ABNORMAL HIGH (ref 8.4–10.5)
Chloride: 99 mEq/L (ref 96–112)
Creat: 0.86 mg/dL (ref 0.50–1.10)
Glucose, Bld: 96 mg/dL (ref 70–99)
Potassium: 4 mEq/L (ref 3.5–5.3)
Sodium: 134 mEq/L — ABNORMAL LOW (ref 135–145)
Total Bilirubin: 0.6 mg/dL (ref 0.2–1.2)
Total Protein: 7.3 g/dL (ref 6.0–8.3)

## 2015-01-20 LAB — TSH: TSH: 1.994 u[IU]/mL (ref 0.350–4.500)

## 2015-01-20 LAB — LIPID PANEL
Cholesterol: 262 mg/dL — ABNORMAL HIGH (ref 0–200)
HDL: 49 mg/dL (ref >=46)
LDL Cholesterol: 185 mg/dL — ABNORMAL HIGH (ref 0–99)
Total CHOL/HDL Ratio: 5.3 Ratio
Triglycerides: 138 mg/dL (ref <150)
VLDL: 28 mg/dL (ref 0–40)

## 2015-01-23 ENCOUNTER — Encounter: Payer: Self-pay | Admitting: *Deleted

## 2015-02-05 ENCOUNTER — Ambulatory Visit (INDEPENDENT_AMBULATORY_CARE_PROVIDER_SITE_OTHER): Payer: PPO | Admitting: Cardiovascular Disease

## 2015-02-05 ENCOUNTER — Encounter: Payer: Self-pay | Admitting: Cardiovascular Disease

## 2015-02-05 VITALS — BP 160/84 | HR 61 | Ht 66.0 in | Wt 144.5 lb

## 2015-02-05 DIAGNOSIS — I10 Essential (primary) hypertension: Secondary | ICD-10-CM

## 2015-02-05 DIAGNOSIS — I701 Atherosclerosis of renal artery: Secondary | ICD-10-CM

## 2015-02-05 DIAGNOSIS — E785 Hyperlipidemia, unspecified: Secondary | ICD-10-CM | POA: Diagnosis not present

## 2015-02-05 DIAGNOSIS — E039 Hypothyroidism, unspecified: Secondary | ICD-10-CM | POA: Diagnosis not present

## 2015-02-05 MED ORDER — SPIRONOLACTONE 25 MG PO TABS: 25.0000 mg | ORAL_TABLET | Freq: Two times a day (BID) | ORAL | Status: DC

## 2015-02-05 NOTE — Patient Instructions (Addendum)
Your physician has recommended you make the following change in your medication: STOP irbesartan Increase the spironalactone to  1 tablet twice daily.  Your physician recommends that you return for B-MET in 1 week.  c-met and lipid in 3 months.  Your physician recommends that you schedule a follow-up appointment in: 3 months.

## 2015-02-06 ENCOUNTER — Encounter: Payer: Self-pay | Admitting: Cardiovascular Disease

## 2015-02-06 NOTE — Progress Notes (Signed)
Patient ID: Lindsey Goodman, female   DOB: [birth year], 74 y.o.   MRN: [id number]    HPI: Lindsey Goodman is a 74 y.o. female presents for a 2 month followup cardiology evaluation.  Lindsey Goodman has a history of hypertension, hypothyroidism, hyperlipidemia, as well as GERD. Remotely, she developed a cough secondary to ACE inhibition and also developed hyponatremia secondary to hydrochlorothiazide. In March 2013, a renal duplex study demonstrated a greater than 60% diameter reduction in the distal aorta at the origin of the right and left common iliac arteries, and had equal to or greater than 60% diameter reduction in the right renal artery. Her left renal artery have normal patency. Her kidney sizes were normal.  When I last saw her 2 months ago there was confusion with reference to her medications.  She had thought she was still on Benicar which she had tolerated well in the past.  Apparently her insurance company had recommended that she switch to a generic and for the past 3 months she has been on irbesartan 300 mg daily in place of Benicar 40 mg.  she also has been taking Lopressor 125 mg twice a day and Aldactone 25 mg daily.   She has been on combination therapy with Zetia 10 mg and atorvastatin 40 mg for her hyperlipidemia. She has taken Prilosec 20 mg for her GERD.  At that time, I recommended that she discontinue the irbesartan and resume the Benicar which she had tolerated well and had gotten insurance approval.  Apparently, she does continue to the somewhat confused with the medication and has inadvertently been taking both irbesartan as well as Benicar.  She also has been taking spironolactone 25 mg once a day in addition to metoprolol tartrate 125 mg twice a day.  She has been on atorvastatin 40 mg and Zetia 10 mg a hyperlipidemia.  In December 2015 cholesterol was 141, LDL 81, HDL 46, TG 71.  CBC was stable.  Potassium was normal at 4.6.  Thyroid function studies were normal on her current dose of  levothyroxine 100 g daily.  She underwent a follow-up renal duplex study on 01/08/2015.  This showed moderate amount of atherosclerotic plaque at the distal segment of her abdominal aorta consistent with greater than 50% diameter reduction by velocity.  The right renal artery demonstrated elevated velocities consistent with less than 60% diameter reduction.  She had normal left renal artery.  She presents for follow-up evaluation.  Past Medical History  Diagnosis Date  . GERD (gastroesophageal reflux disease)   . Hypertension   . Hyperlipidemia   . Hypothyroidism   . Hx of echocardiogram 12/09/2010    EF 55% showed mild concrentric LVH with normal systolic function and grade 1 diastolic dysfunction. she had mild to moderate LA dilation, mild to moderate mitral annular calcification with moderate MR, and she had trival tricupid regurgitation.  Marland Kitchen Hx of Doppler ultrasound 12/09/2010    Renal duplexsuggested distal abdominal aorta segment in the origin of the right and left commom iliac arteries with elevated velocities consistent with at least 50% diameter reduction, 60% narrowing in the right renal artery with stable PSV at 210.    Past Surgical History  Procedure Laterality Date  . No prior surgery      Allergies  Allergen Reactions  . Amlodipine Besylate Swelling  . Prednisone     No appetite    Current Outpatient Prescriptions  Medication Sig Dispense Refill  . atorvastatin (LIPITOR) 40 MG tablet Take 1 tablet (  40 mg total) by mouth daily at 6 PM. 30 tablet 11  . levothyroxine (SYNTHROID, LEVOTHROID) 100 MCG tablet Take 1 tablet (100 mcg total) by mouth daily before breakfast. 30 tablet 8  . metoprolol (LOPRESSOR) 100 MG tablet TAKE 1 TABLET (100 MG TOTAL) BY MOUTH 2 (TWO) TIMES DAILY. 180 tablet 3  . metoprolol tartrate (LOPRESSOR) 25 MG tablet TAKE 1 TABLET (25 MG TOTAL) BY MOUTH 2 (TWO) TIMES DAILY. 180 tablet 0  . olmesartan (BENICAR) 40 MG tablet Take 1 tablet (40 mg total)  by mouth daily. 30 tablet 6  . spironolactone (ALDACTONE) 25 MG tablet Take 1 tablet (25 mg total) by mouth 2 (two) times daily. 60 tablet 6  . ZETIA 10 MG tablet TAKE 1 TABLET EVERY DAY 30 tablet 6   No current facility-administered medications for this visit.    History   Social History  . Marital Status: Married    Spouse Name: N/A  . Number of Children: N/A  . Years of Education: N/A   Occupational History  . Not on file.   Social History Main Topics  . Smoking status: Never Smoker   . Smokeless tobacco: Never Used  . Alcohol Use: No  . Drug Use: No  . Sexual Activity: Yes    Birth Control/ Protection: None   Other Topics Concern  . Not on file   Social History Narrative    Family History  Problem Relation Age of Onset  . Hypertension Mother   . Hypertension Father   . Stroke Father   . Colon cancer Neg Hx   . Stomach cancer Neg Hx    Social history is notable that she is married has 2 children. He does travel with her husband's work as well as vacation. She does exercise. There is no tobacco or alcohol use.   ROS General: Negative; No fevers, chills, or night sweats;  HEENT: Negative; No changes in vision or hearing, sinus congestion, difficulty swallowing Pulmonary: Negative; No cough, wheezing, shortness of breath, hemoptysis Cardiovascular: Negative; No chest pain, presyncope, syncope, palpatations GI: Negative; No nausea, vomiting, diarrhea, or abdominal pain GU: Negative; No dysuria, hematuria, or difficulty voiding Musculoskeletal: Negative; no myalgias, joint pain, or weakness Hematologic/Oncology: Mild bruisability; no, bleeding Endocrine: Positive for hypothyroidism; no heat/cold intolerance; no diabetes Neuro: Negative; no changes in balance, headaches Skin: Negative; No rashes or skin lesions Psychiatric: Negative; No behavioral problems, depression Sleep: Positive for previous history of inadequate sleep duration; No snoring, daytime  sleepiness, hypersomnolence, bruxism, restless legs, hypnogognic hallucinations, no cataplexy Other comprehensive 14 point system review is negative.  PE BP 160/84 mmHg  Pulse 61  Ht '5\' 6"'  (1.676 m)  Wt 144 lb 8 oz (65.545 kg)  BMI 23.33 kg/m2  General: Alert, oriented, no distress.  Skin: normal turgor, no rashes, small area of ecchymosis in the region of the wrist HEENT: Normocephalic, atraumatic. Pupils round and reactive; sclera anicteric;no lid lag.  Nose without nasal septal hypertrophy Mouth/Parynx benign; Mallinpatti scale 2 Neck: No JVD, no carotid bruits with normal upstroke Lungs: clear to ausculatation and percussion; no wheezing or rales Chest wall: Nontender to palpation Heart: PMI not displaced; RRR, s1 s2 normal 1/6 sem; no S3 or S4 gallop.  No diastolic murmur.  No rubs, thrills or heaves. Abdomen: Harsh mid abdominal bruit ; soft, nontender; no hepatosplenomehaly, BS+; abdominal aorta nontender and not dilated by palpation. Back: No CVA tenderness Pulses 2+ Extremities: no clubbing cyanosis or edema, Homan's sign negative  Neurologic: grossly  nonfocal Psychologic: normal affect and mood.  ECG (independently read by me): Normal sinus rhythm at 61 bpm.  Nonspecific ST changes.  March 2016 ECG (independently read by me): Normal sinus rhythm at 60 bpm.  Nonspecific ST changes.  Normal intervals.  09/05/2014 ECG (independently read by me) : Sinus bradycardia at 54 bpm.  Nonsignificant ST-T changes  May 2015 ECG  (independently read by me): Sinus bradycardia 57 beats per minute.  One isolated PVC.  Normal intervals.  Prior ECG: Sinus bradycardia 57 beats per minute. Normal intervals. No significant ST changes.  LABS:  BMET  BMP Latest Ref Rng 01/19/2015 09/05/2014 03/07/2014  Glucose 70 - 99 mg/dL 96 109(H) 108(H)  BUN 6 - 23 mg/dL '16 14 12  ' Creatinine 0.50 - 1.10 mg/dL 0.86 0.80 0.88  Sodium 135 - 145 mEq/L 134(L) 138 137  Potassium 3.5 - 5.3 mEq/L 4.0 4.6 4.9    Chloride 96 - 112 mEq/L 99 102 100  CO2 19 - 32 mEq/L '25 25 28  ' Calcium 8.4 - 10.5 mg/dL 10.7(H) 10.0 10.7(H)     Hepatic Function Panel   Hepatic Function Latest Ref Rng 01/19/2015 09/05/2014 12/14/2013  Total Protein 6.0 - 8.3 g/dL 7.3 7.1 7.0  Albumin 3.5 - 5.2 g/dL 4.4 4.4 4.5  AST 0 - 37 U/L '17 22 23  ' ALT 0 - 35 U/L '11 16 17  ' Alk Phosphatase 39 - 117 U/L 75 88 66  Total Bilirubin 0.2 - 1.2 mg/dL 0.6 0.6 0.6     CBC  CBC Latest Ref Rng 01/19/2015 09/05/2014 12/14/2013  WBC 4.0 - 10.5 K/uL 6.0 6.8 5.9  Hemoglobin 12.0 - 15.0 g/dL 15.0 14.2 13.6  Hematocrit 36.0 - 46.0 % 44.1 41.2 40.8  Platelets 150 - 400 K/uL 271 284 285     BNP No results found for: PROBNP  Lipid Panel     Component Value Date/Time   CHOL 262* 01/19/2015 0814   TRIG 138 01/19/2015 0814   HDL 49 01/19/2015 0814   CHOLHDL 5.3 01/19/2015 0814   VLDL 28 01/19/2015 0814   LDLCALC 185* 01/19/2015 0814     RADIOLOGY: No results found.    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  A renal duplex scan in March 2013 revealed her PSV was 261 with EDV 55 on the right. He was also found to have narrowing in the distal segment of her aorta.  Her most recent renal Doppler study was reviewed with her in detail, and this again showed greater than 50% diameter stenosis in the distal abdominal aorta due to plaque.  There was at least mild right renal artery stenosis with peak PSV in the mid renal artery 260, not significant change from previously.  She is confused about her medication.  She has been taking both irbesartan as well as Benicar inadvertently.  I have recommended she discontinue the irbesartan since she has done well with Benicar in the past and has tolerated this.  She'll continue 40 mg daily.  I am increasing her spironolactone to 57m twice a day for more optimal blood pressure control with her blood  pressure today at 160/84.  She will have a follow-up Bmet in 1 week to make certain she is tolerating this from a renal standpoint and potassium status.  She is continuing on atorvastatin and Zetia for hyperlipidemia.  In 2 months.  She will undergo a Cmet as  well as lipid panel.  I will see her back in the office in 3 months for follow-up evaluation.    Time spent: 25 minutes   Troy Sine, MD, [facility]  02/06/2015 6:24 PM

## 2015-02-14 LAB — BASIC METABOLIC PANEL
BUN: 13 mg/dL (ref 6–23)
CO2: 20 mEq/L (ref 19–32)
Calcium: 10.1 mg/dL (ref 8.4–10.5)
Chloride: 106 mEq/L (ref 96–112)
Creat: 0.86 mg/dL (ref 0.50–1.10)
Glucose, Bld: 94 mg/dL (ref 70–99)
Potassium: 5 mEq/L (ref 3.5–5.3)
Sodium: 138 mEq/L (ref 135–145)

## 2015-02-18 ENCOUNTER — Other Ambulatory Visit: Payer: Self-pay | Admitting: Cardiovascular Disease

## 2015-02-21 ENCOUNTER — Encounter: Payer: Self-pay | Admitting: *Deleted

## 2015-03-08 ENCOUNTER — Telehealth: Payer: Self-pay | Admitting: Cardiovascular Disease

## 2015-03-08 MED ORDER — LEVOTHYROXINE SODIUM 100 MCG PO TABS: ORAL_TABLET | ORAL | Status: DC

## 2015-03-08 MED ORDER — OLMESARTAN MEDOXOMIL 40 MG PO TABS: 40.0000 mg | ORAL_TABLET | Freq: Every day | ORAL | Status: DC

## 2015-03-08 MED ORDER — EZETIMIBE 10 MG PO TABS: 10.0000 mg | ORAL_TABLET | Freq: Every day | ORAL | Status: DC

## 2015-03-08 MED ORDER — METOPROLOL TARTRATE 25 MG PO TABS: ORAL_TABLET | ORAL | Status: DC

## 2015-03-08 MED ORDER — ATORVASTATIN CALCIUM 40 MG PO TABS: 40.0000 mg | ORAL_TABLET | Freq: Every day | ORAL | Status: DC

## 2015-03-08 MED ORDER — SPIRONOLACTONE 25 MG PO TABS: 25.0000 mg | ORAL_TABLET | Freq: Two times a day (BID) | ORAL | Status: DC

## 2015-03-08 MED ORDER — METOPROLOL TARTRATE 100 MG PO TABS: ORAL_TABLET | ORAL | Status: DC

## 2015-03-08 NOTE — Telephone Encounter (Signed)
°  1. Which medications need to be refilled? Metoprolol 100 mg,Metoprolol 25 mg Spironolactone,Benicar,Atorvastatin,Zetia,and Levothyroxine-new prescriptions-please call today,asap 2. Which pharmacy [facility] to?[phone]  3. Do they need a 30 day or 90 day supply? 90 and refills  4. Would they like a call back once the medication has been sent to the pharmacy? yes

## 2015-03-08 NOTE — Telephone Encounter (Signed)
E-sent medications into pharmacy -[facility] LEFT MESSAGE ON PATIENT 'S VOICEMAIL -- NO REFILLS;PATIENT HAS AN APPOINTMENT 05/10/15

## 2015-03-17 ENCOUNTER — Other Ambulatory Visit: Payer: Self-pay | Admitting: Cardiovascular Disease

## 2015-05-10 ENCOUNTER — Ambulatory Visit (INDEPENDENT_AMBULATORY_CARE_PROVIDER_SITE_OTHER): Payer: PPO | Admitting: Cardiovascular Disease

## 2015-05-10 ENCOUNTER — Encounter: Payer: Self-pay | Admitting: Cardiovascular Disease

## 2015-05-10 VITALS — BP 144/76 | HR 62 | Ht 64.5 in | Wt 140.7 lb

## 2015-05-10 DIAGNOSIS — I10 Essential (primary) hypertension: Secondary | ICD-10-CM | POA: Diagnosis not present

## 2015-05-10 DIAGNOSIS — E039 Hypothyroidism, unspecified: Secondary | ICD-10-CM | POA: Diagnosis not present

## 2015-05-10 DIAGNOSIS — I701 Atherosclerosis of renal artery: Secondary | ICD-10-CM

## 2015-05-10 DIAGNOSIS — J302 Other seasonal allergic rhinitis: Secondary | ICD-10-CM | POA: Insufficient documentation

## 2015-05-10 DIAGNOSIS — E785 Hyperlipidemia, unspecified: Secondary | ICD-10-CM | POA: Diagnosis not present

## 2015-05-10 MED ORDER — METOPROLOL TARTRATE 25 MG PO TABS: 25.0000 mg | ORAL_TABLET | Freq: Two times a day (BID) | ORAL | Status: DC

## 2015-05-10 MED ORDER — METOPROLOL TARTRATE 100 MG PO TABS: ORAL_TABLET | ORAL | Status: DC

## 2015-05-10 MED ORDER — METOPROLOL TARTRATE 100 MG PO TABS
ORAL_TABLET | ORAL | Status: DC
Start: 2015-05-10 — End: 2015-05-10

## 2015-05-10 NOTE — Progress Notes (Signed)
Patient ID: Lindsey Goodman, female   DOB: 07/13/1941, 74 y.o.   MRN: [id number]    HPI: Lindsey Goodman is a 74 y.o. female presents for a 3 month followup cardiology evaluation.  Lindsey Goodman has a history of hypertension, hypothyroidism, hyperlipidemia, as well as GERD. Remotely, she developed a cough secondary to ACE inhibition and also developed hyponatremia secondary to hydrochlorothiazide. In March 2013, a renal duplex study demonstrated a greater than 60% diameter reduction in the distal aorta at the origin of the right and left common iliac arteries, and had equal to or greater than 60% diameter reduction in the right renal artery. Her left renal artery have normal patency. Her kidney sizes were normal.  When I last saw her 2 months ago there was confusion with reference to her medications.  She had thought she was still on Benicar which she had tolerated well in the past.  Apparently her insurance company had recommended that she switch to a generic and for the past 3 months she has been on irbesartan 300 mg daily in place of Benicar 40 mg.  she also has been taking Lopressor 125 mg twice a day and Aldactone 25 mg daily.   She has been on combination therapy with Zetia 10 mg and atorvastatin 40 mg for her hyperlipidemia. She has taken Prilosec 20 mg for her GERD.  At that time, I recommended that she discontinue the irbesartan and resume the Benicar which she had tolerated well and had gotten insurance approval.  Apparently, she does continue to the somewhat confused with the medication and has inadvertently been taking both irbesartan as well as Benicar.  She also has been taking spironolactone 25 mg once a day in addition to metoprolol tartrate 125 mg twice a day.  She has been on atorvastatin 40 mg and Zetia 10 mg a hyperlipidemia.  In December 2015 cholesterol was 141, LDL 81, HDL 46, TG 71.  CBC was stable.  Potassium was normal at 4.6.  Thyroid function studies were normal on her current dose of  levothyroxine 100 g daily.  She underwent a follow-up renal duplex study on 01/08/2015.  This showed moderate amount of atherosclerotic plaque at the distal segment of her abdominal aorta consistent with greater than 50% diameter reduction by velocity.  The right renal artery demonstrated elevated velocities consistent with less than 60% diameter reduction.  She had normal left renal artery.    Since I last saw her, follow-up laboratory revealed a total cholesterol 262, LDL 185, HDL 49 VLDL 28, triglycerides 138.  She had had some issues with medication confusion but states she is now taking the atorvastatin and Zetia.  Apparently, upon further questioning, she is not taking metoprolol, tartrate 125 twice a day but instead is taking 125 mg of metoprolol in the morning and only 25 mg at night.  She remains active.  She denies palpitations.  She denies PND, orthopnea.  She denies chest pain.  She does have issues with allergies.  She presents for evaluation  Past Medical History  Diagnosis Date  . GERD (gastroesophageal reflux disease)   . Hypertension   . Hyperlipidemia   . Hypothyroidism   . Hx of echocardiogram 12/09/2010    EF 55% showed mild concrentric LVH with normal systolic function and grade 1 diastolic dysfunction. she had mild to moderate LA dilation, mild to moderate mitral annular calcification with moderate MR, and she had trival tricupid regurgitation.  Marland Kitchen Hx of Doppler ultrasound 12/09/2010  Renal duplexsuggested distal abdominal aorta segment in the origin of the right and left commom iliac arteries with elevated velocities consistent with at least 50% diameter reduction, 60% narrowing in the right renal artery with stable PSV at 210.    Past Surgical History  Procedure Laterality Date  . No prior surgery      Allergies  Allergen Reactions  . Amlodipine Besylate Swelling  . Prednisone     No appetite    Current Outpatient Prescriptions  Medication Sig Dispense Refill    . atorvastatin (LIPITOR) 40 MG tablet TAKE 1 TABLET (40 MG TOTAL) BY MOUTH DAILY AT 6 PM. 30 tablet 11  . ezetimibe (ZETIA) 10 MG tablet Take 1 tablet (10 mg total) by mouth daily. 90 tablet 0  . levothyroxine (SYNTHROID, LEVOTHROID) 100 MCG tablet TAKE 1 TABLET (100 MCG TOTAL) BY MOUTH DAILY BEFORE BREAKFAST. 90 tablet 0  . metoprolol (LOPRESSOR) 100 MG tablet TAKE 1 TABLET (100 MG TOTAL) BY MOUTH 2 (TWO) TIMES DAILY. 180 tablet 0  . olmesartan (BENICAR) 40 MG tablet Take 1 tablet (40 mg total) by mouth daily. 90 tablet 0  . spironolactone (ALDACTONE) 25 MG tablet Take 1 tablet (25 mg total) by mouth 2 (two) times daily. 180 tablet 0   No current facility-administered medications for this visit.    History   Social History  . Marital Status: Married    Spouse Name: N/A  . Number of Children: N/A  . Years of Education: N/A   Occupational History  . Not on file.   Social History Main Topics  . Smoking status: Never Smoker   . Smokeless tobacco: Never Used  . Alcohol Use: No  . Drug Use: No  . Sexual Activity: Yes    Birth Control/ Protection: None   Other Topics Concern  . Not on file   Social History Narrative    Family History  Problem Relation Age of Onset  . Hypertension Mother   . Hypertension Father   . Stroke Father   . Colon cancer Neg Hx   . Stomach cancer Neg Hx    Social history is notable that she is married has 2 children. He does travel with her husband's work as well as vacation. She does exercise. There is no tobacco or alcohol use.   ROS General: Negative; No fevers, chills, or night sweats;  HEENT: Negative; No changes in vision or hearing, sinus congestion, difficulty swallowing Pulmonary: Negative; No cough, wheezing, shortness of breath, hemoptysis Cardiovascular: Negative; No chest pain, presyncope, syncope, palpatations GI: Negative; No nausea, vomiting, diarrhea, or abdominal pain GU: Negative; No dysuria, hematuria, or difficulty  voiding Musculoskeletal: Negative; no myalgias, joint pain, or weakness Hematologic/Oncology: Mild bruisability; no, bleeding Endocrine: Positive for hypothyroidism; no heat/cold intolerance; no diabetes Neuro: Negative; no changes in balance, headaches Skin: Negative; No rashes or skin lesions Psychiatric: Negative; No behavioral problems, depression Sleep: Positive for previous history of inadequate sleep duration; No snoring, daytime sleepiness, hypersomnolence, bruxism, restless legs, hypnogognic hallucinations, no cataplexy Other comprehensive 14 point system review is negative.  PE BP 144/76 mmHg  Pulse 62  Ht 5' 4.5" (1.638 m)  Wt 140 lb 11.2 oz (63.821 kg)  BMI 23.79 kg/m2  Repeat blood pressure by me was 124/70.  Wt Readings from Last 3 Encounters:  05/10/15 140 lb 11.2 oz (63.821 kg)  02/05/15 144 lb 8 oz (65.545 kg)  12/11/14 146 lb 9.6 oz (66.497 kg)   General: Alert, oriented, no distress.  Skin: normal  turgor, no rashes, small area of ecchymosis in the region of the wrist HEENT: Normocephalic, atraumatic. Pupils round and reactive; sclera anicteric;no lid lag.  Nose without nasal septal hypertrophy Mouth/Parynx benign; Mallinpatti scale 2 Neck: No JVD, no carotid bruits with normal upstroke Lungs: clear to ausculatation and percussion; no wheezing or rales Chest wall: Nontender to palpation Heart: PMI not displaced; RRR, s1 s2 normal 1/6 sem; no S3 or S4 gallop.  No diastolic murmur.  No rubs, thrills or heaves. Abdomen: Harsh mid abdominal bruit ; soft, nontender; no hepatosplenomehaly, BS+; abdominal aorta nontender and not dilated by palpation. Back: No CVA tenderness Pulses 2+ Extremities: no clubbing cyanosis or edema, Homan's sign negative  Neurologic: grossly nonfocal Psychologic: normal affect and mood.  ECG (independently read by me): Normal sinus rhythm at 62 bpm.  Normal intervals.  Nonspecific ST changes.  May 2016 ECG (independently read by me):  Normal sinus rhythm at 61 bpm.  Nonspecific ST changes.  March 2016 ECG (independently read by me): Normal sinus rhythm at 60 bpm.  Nonspecific ST changes.  Normal intervals.  09/05/2014 ECG (independently read by me) : Sinus bradycardia at 54 bpm.  Nonsignificant ST-T changes  May 2015 ECG  (independently read by me): Sinus bradycardia 57 beats per minute.  One isolated PVC.  Normal intervals.  Prior ECG: Sinus bradycardia 57 beats per minute. Normal intervals. No significant ST changes.  LABS:  BMET  BMP Latest Ref Rng 02/13/2015 01/19/2015 09/05/2014  Glucose 70 - 99 mg/dL 94 96 109(H)  BUN 6 - 23 mg/dL '13 16 14  ' Creatinine 0.50 - 1.10 mg/dL 0.86 0.86 0.80  Sodium 135 - 145 mEq/L 138 134(L) 138  Potassium 3.5 - 5.3 mEq/L 5.0 4.0 4.6  Chloride 96 - 112 mEq/L 106 99 102  CO2 19 - 32 mEq/L '20 25 25  ' Calcium 8.4 - 10.5 mg/dL 10.1 10.7(H) 10.0     Hepatic Function Panel   Hepatic Function Latest Ref Rng 01/19/2015 09/05/2014 12/14/2013  Total Protein 6.0 - 8.3 g/dL 7.3 7.1 7.0  Albumin 3.5 - 5.2 g/dL 4.4 4.4 4.5  AST 0 - 37 U/L '17 22 23  ' ALT 0 - 35 U/L '11 16 17  ' Alk Phosphatase 39 - 117 U/L 75 88 66  Total Bilirubin 0.2 - 1.2 mg/dL 0.6 0.6 0.6     CBC  CBC Latest Ref Rng 01/19/2015 09/05/2014 12/14/2013  WBC 4.0 - 10.5 K/uL 6.0 6.8 5.9  Hemoglobin 12.0 - 15.0 g/dL 15.0 14.2 13.6  Hematocrit 36.0 - 46.0 % 44.1 41.2 40.8  Platelets 150 - 400 K/uL 271 284 285     BNP No results found for: PROBNP  Lipid Panel     Component Value Date/Time   CHOL 262* 01/19/2015 0814   TRIG 138 01/19/2015 0814   HDL 49 01/19/2015 0814   CHOLHDL 5.3 01/19/2015 0814   VLDL 28 01/19/2015 0814   LDLCALC 185* 01/19/2015 0814     RADIOLOGY: No results found.    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  A renal duplex scan in March 2013 revealed her PSV was 261 with EDV 55 on the  right. He was also found to have narrowing in the distal segment of her aorta.  Her recent renal Doppler study was  again showed greater than 50% diameter stenosis in the distal abdominal aorta due to plaque.  There was at least mild  right renal artery stenosis with peak PSV in the mid renal artery 260, not significant change from previously.  In the past, Lindsey Goodman has had significant confusion concerning her medications.  Apparently presently, she is taking metoprolol, tartrate 125 mg morning and only 25 mg at night.  She has both 100 mg as well as a 25 mg tablets.  I am recommending that she change and just take her 100 mg pill in the morning and take a half of the 100 mg pill at night, so that she will still have a total dose of 150 mg daily.  She is not having any palpitations.  Her blood pressure today on repeat by me was controlled.  There is no edema on spironolactone and Benicar in addition to her metoprolol.  Her most recent lipid panel was markedly abnormal and I suspect this may have been due to not taking her atorvastatin and Zetia.  I will recheck this in the fasting state with a chemistry profile, and lipid panel to see if this is now improved.  If she is taking her medications and her levels are still markedly elevated.  Further treatment will be necessary.  I advised over-the-counter Claritin or Zyrtec or Allegra to help with her allergies.  She will monitor her blood pressure at home.  I will see her in 6 months for reevaluation or sooner from his arise.  Time spent: 25 minutes  Troy Sine, MD, [facility]  05/10/2015 8:50 AM

## 2015-05-10 NOTE — Addendum Note (Signed)
Addended byLauralee Evener. on: 05/10/2015 09:43 AM   Modules accepted: Orders

## 2015-05-10 NOTE — Patient Instructions (Addendum)
Your physician recommends that you return for lab work fasting.  Your physician has recommended you make the following change in your medication: the lopressor has been changed to 100 mg (1tablet) in the morning and  (1/2 tablet)50 mg in the evening. START THIS ONCE YOU HAVE COMPLETED THE 25 mg TABLETS.  Your physician wants you to follow-up in: 6 MONTHS OR SOONER IF NEEDED. You will receive a reminder letter in the mail two months in advance. If you don't receive a letter, please call our office to schedule the follow-up appointment.  PATIENT RETURNED AFTER BEING SEEN AND CHART BEING DICTATED TO REPORT THAT SHE IS IN FACT TAKING BOTH THE LOPRESSOR 100 MG AND 25 MG TABLETS TWICE DAILY. PER DR KELLY PATIENT IS TO CONTINUE DOING THIS. DISREGARD THE ABOVE INSTRUCTIONS. MEDICATION INSTRUCTIONS CHANGED IN THE COMPUTER BACK TO ORIGINAL DIRECTIONS.

## 2015-06-05 ENCOUNTER — Other Ambulatory Visit: Payer: Self-pay | Admitting: Cardiovascular Disease

## 2015-06-05 NOTE — Telephone Encounter (Signed)
Rx request sent to pharmacy.  

## 2015-06-13 ENCOUNTER — Other Ambulatory Visit: Payer: Self-pay | Admitting: Cardiovascular Disease

## 2015-06-13 NOTE — Telephone Encounter (Signed)
°  1. Which medications need to be refilled? Benicar 40mg    2. Which pharmacy is [facility] to? [facility] [address]   3. Do they need a 30 day or 90 day supply? 90  4. Would they like a call back once the medication has been sent to the pharmacy? Yes

## 2015-06-13 NOTE — Telephone Encounter (Signed)
Rx request sent to pharmacy.  

## 2015-07-02 ENCOUNTER — Other Ambulatory Visit: Payer: Self-pay | Admitting: Cardiovascular Disease

## 2015-07-07 ENCOUNTER — Other Ambulatory Visit: Payer: Self-pay | Admitting: Cardiovascular Disease

## 2015-07-09 NOTE — Telephone Encounter (Signed)
Rx(s) sent to pharmacy electronically.  

## 2015-07-12 ENCOUNTER — Other Ambulatory Visit: Payer: Self-pay | Admitting: Cardiovascular Disease

## 2015-07-12 NOTE — Telephone Encounter (Signed)
Rx request sent to pharmacy.  

## 2015-07-20 LAB — LIPID PANEL
Cholesterol: 142 mg/dL (ref 125–200)
HDL: 43 mg/dL — ABNORMAL LOW (ref >=46)
LDL Cholesterol: 76 mg/dL (ref <130)
Total CHOL/HDL Ratio: 3.3 Ratio (ref <=5.0)
Triglycerides: 116 mg/dL (ref <150)
VLDL: 23 mg/dL (ref <30)

## 2015-07-20 LAB — COMPREHENSIVE METABOLIC PANEL
ALT: 13 U/L (ref 6–29)
AST: 20 U/L (ref 10–35)
Albumin: 4.5 g/dL (ref 3.6–5.1)
Alkaline Phosphatase: 74 U/L (ref 33–130)
BUN: 23 mg/dL (ref 7–25)
CO2: 25 mmol/L (ref 20–31)
Calcium: 10.7 mg/dL — ABNORMAL HIGH (ref 8.6–10.4)
Chloride: 97 mmol/L — ABNORMAL LOW (ref 98–110)
Creat: 1.01 mg/dL — ABNORMAL HIGH (ref 0.60–0.93)
Glucose, Bld: 97 mg/dL (ref 65–99)
Potassium: 4.9 mmol/L (ref 3.5–5.3)
Sodium: 132 mmol/L — ABNORMAL LOW (ref 135–146)
Total Bilirubin: 0.8 mg/dL (ref 0.2–1.2)
Total Protein: 7.1 g/dL (ref 6.1–8.1)

## 2015-07-24 ENCOUNTER — Encounter: Payer: Self-pay | Admitting: *Deleted

## 2015-08-07 ENCOUNTER — Other Ambulatory Visit: Payer: Self-pay | Admitting: Cardiovascular Disease

## 2015-08-09 ENCOUNTER — Other Ambulatory Visit: Payer: Self-pay | Admitting: Cardiovascular Disease

## 2015-08-31 ENCOUNTER — Other Ambulatory Visit: Payer: Self-pay | Admitting: Cardiovascular Disease

## 2015-10-13 ENCOUNTER — Other Ambulatory Visit: Payer: Self-pay | Admitting: Cardiovascular Disease

## 2015-10-18 ENCOUNTER — Other Ambulatory Visit: Payer: Self-pay | Admitting: Cardiovascular Disease

## 2015-10-18 NOTE — Telephone Encounter (Signed)
Rx request sent to pharmacy.  

## 2015-11-07 ENCOUNTER — Other Ambulatory Visit: Payer: Self-pay | Admitting: Cardiovascular Disease

## 2015-12-03 ENCOUNTER — Other Ambulatory Visit: Payer: Self-pay | Admitting: Cardiovascular Disease

## 2015-12-03 NOTE — Telephone Encounter (Signed)
REFILL 

## 2015-12-13 ENCOUNTER — Other Ambulatory Visit: Payer: Self-pay | Admitting: Cardiovascular Disease

## 2015-12-13 NOTE — Telephone Encounter (Signed)
Rx request sent to pharmacy.  

## 2015-12-27 ENCOUNTER — Other Ambulatory Visit: Payer: Self-pay | Admitting: Cardiovascular Disease

## 2015-12-27 NOTE — Telephone Encounter (Signed)
Rx(s) sent to pharmacy electronically.  

## 2016-01-09 ENCOUNTER — Other Ambulatory Visit: Payer: Self-pay | Admitting: Cardiovascular Disease

## 2016-02-12 ENCOUNTER — Other Ambulatory Visit: Payer: Self-pay | Admitting: Cardiovascular Disease

## 2016-02-12 NOTE — Telephone Encounter (Signed)
Rx request sent to pharmacy.  

## 2016-03-04 ENCOUNTER — Other Ambulatory Visit: Payer: Self-pay | Admitting: Cardiovascular Disease

## 2016-03-11 ENCOUNTER — Other Ambulatory Visit: Payer: Self-pay | Admitting: Cardiovascular Disease

## 2016-03-11 NOTE — Telephone Encounter (Signed)
Rx(s) sent to pharmacy electronically.  

## 2016-03-17 ENCOUNTER — Other Ambulatory Visit: Payer: Self-pay | Admitting: Cardiovascular Disease

## 2016-03-17 DIAGNOSIS — I701 Atherosclerosis of renal artery: Secondary | ICD-10-CM

## 2016-03-20 ENCOUNTER — Ambulatory Visit (HOSPITAL_COMMUNITY)
Admission: RE | Admit: 2016-03-20 | Discharge: 2016-03-20 | Disposition: A | Discharge status: Home / Self Care | Payer: PPO | Source: Ambulatory Visit | Attending: Internal Medicine | Admitting: Internal Medicine

## 2016-03-20 DIAGNOSIS — I708 Atherosclerosis of other arteries: Secondary | ICD-10-CM | POA: Diagnosis not present

## 2016-03-20 DIAGNOSIS — N1330 Unspecified hydronephrosis: Secondary | ICD-10-CM | POA: Insufficient documentation

## 2016-03-20 DIAGNOSIS — I701 Atherosclerosis of renal artery: Secondary | ICD-10-CM | POA: Insufficient documentation

## 2016-03-20 DIAGNOSIS — Q251 Coarctation of aorta: Secondary | ICD-10-CM | POA: Insufficient documentation

## 2016-03-20 DIAGNOSIS — E785 Hyperlipidemia, unspecified: Secondary | ICD-10-CM | POA: Diagnosis not present

## 2016-03-20 DIAGNOSIS — I700 Atherosclerosis of aorta: Secondary | ICD-10-CM | POA: Diagnosis not present

## 2016-03-20 DIAGNOSIS — K219 Gastro-esophageal reflux disease without esophagitis: Secondary | ICD-10-CM | POA: Diagnosis not present

## 2016-03-20 DIAGNOSIS — I10 Essential (primary) hypertension: Secondary | ICD-10-CM | POA: Insufficient documentation

## 2016-03-28 ENCOUNTER — Telehealth: Payer: Self-pay | Admitting: *Deleted

## 2016-03-28 NOTE — Telephone Encounter (Signed)
-----   Message from Troy Sine, MD sent at 03/25/2016  3:20 PM EDT ----- Normal caliber abdominal aorta without evidence for aneurysm Mild Aorto-iliac atherosclerosis. >50% distal aorta stenosis. Bilateral kidneys are decreased in size and the left kidney is 1.1 cm smaller than the right. Mild not significant right renal artery stenosis.  Hydronephrosis is noted in the bilateral kidneys .  Recommend kidney ultrasound and if confirmed then urology evaluation

## 2016-04-03 ENCOUNTER — Telehealth: Payer: Self-pay | Admitting: *Deleted

## 2016-04-03 NOTE — Telephone Encounter (Signed)
-----   Message from Troy Sine, MD sent at 03/25/2016  3:20 PM EDT ----- Normal caliber abdominal aorta without evidence for aneurysm Mild Aorto-iliac atherosclerosis. >50% distal aorta stenosis. Bilateral kidneys are decreased in size and the left kidney is 1.1 cm smaller than the right. Mild not significant right renal artery stenosis.  Hydronephrosis is noted in the bilateral kidneys .  Recommend kidney ultrasound and if confirmed then urology evaluation

## 2016-04-03 NOTE — Telephone Encounter (Signed)
Left message called to discuss renal doppler results.

## 2016-04-09 ENCOUNTER — Other Ambulatory Visit: Payer: Self-pay | Admitting: Cardiovascular Disease

## 2016-04-09 NOTE — Telephone Encounter (Signed)
Rx Refill

## 2016-04-09 NOTE — Telephone Encounter (Signed)
New message   Pt is calling for the rn, she verbalized that she is returning call

## 2016-06-04 ENCOUNTER — Ambulatory Visit (INDEPENDENT_AMBULATORY_CARE_PROVIDER_SITE_OTHER): Payer: PPO | Admitting: Physician Assistant

## 2016-06-04 ENCOUNTER — Encounter (INDEPENDENT_AMBULATORY_CARE_PROVIDER_SITE_OTHER): Payer: Self-pay

## 2016-06-04 ENCOUNTER — Encounter: Payer: Self-pay | Admitting: Physician Assistant

## 2016-06-04 VITALS — BP 120/76 | HR 64 | Ht 65.0 in | Wt 129.2 lb

## 2016-06-04 DIAGNOSIS — E785 Hyperlipidemia, unspecified: Secondary | ICD-10-CM

## 2016-06-04 DIAGNOSIS — I701 Atherosclerosis of renal artery: Secondary | ICD-10-CM | POA: Diagnosis not present

## 2016-06-04 DIAGNOSIS — I10 Essential (primary) hypertension: Secondary | ICD-10-CM | POA: Diagnosis not present

## 2016-06-04 NOTE — Patient Instructions (Signed)
Medication Instructions:  Continue current medications  Labwork: Fasting Lipids and CMP  Testing/Procedures: None Ordered  Follow-Up: Your physician recommends that you schedule a follow-up appointment in: Next Available with Dr Claiborne Billings   Any Other Special Instructions Will Be Listed Below (If Applicable).   If you need a refill on your cardiac medications before your next appointment, please call your pharmacy.

## 2016-06-04 NOTE — Progress Notes (Signed)
Cardiology Office Note   Date:  06/04/2016   ID:  Lindsey Goodman, DOB September 15, 1941, MRN [id number]  PCP:  No PCP Per Patient  Cardiologist:  Dr Meredeth Ide, PA-C   Chief Complaint  Patient presents with  . Annual Exam    pt denied chest pain and SOB, pt c/o little dizziness at times    History of Present Illness: Lindsey Goodman is a 75 y.o. female with a history of HTN, HLD, GERD, hypothyroid, hyponatremia 2nd HCTZ, mod R-RAS at 60%  Lindsey Goodman presents for A follow-up to evaluate the renal ultrasound and yearly follow-up for cardiac issues.  Lindsey Goodman has no chest pain or shortness of breath. She is active climbing stairs in a three-story home and spending a great deal of time at the beach. She does housework and Haematologist. She has no lower extremity edema, no orthopnea or PND. Her blood pressure is stable. She does not get palpitations or near syncope.  She does get a little dizzy at times, but feels that is secondary to her doing too much. She will drink some water and rest and the symptoms will resolve.  Her blood pressure has been much harder to control in the past, it is doing much better now. She is compliant with her medications.   Past Medical History:  Diagnosis Date  . GERD (gastroesophageal reflux disease)   . Hx of Doppler ultrasound 12/09/2010   Renal duplexsuggested distal abdominal aorta segment in the origin of the right and left commom iliac arteries with elevated velocities consistent with at least 50% diameter reduction, 60% narrowing in the right renal artery with stable PSV at 210.  Marland Kitchen Hx of echocardiogram 12/09/2010   EF 55% showed mild concrentric LVH with normal systolic function and grade 1 diastolic dysfunction. she had mild to moderate LA dilation, mild to moderate mitral annular calcification with moderate MR, and she had trival tricupid regurgitation.  . Hyperlipidemia   . Hypertension   . Hypothyroidism     Past Surgical History:    Procedure Laterality Date  . no prior surgery      Current Outpatient Prescriptions  Medication Sig Dispense Refill  . ezetimibe (ZETIA) 10 MG tablet TAKE 1 TABLET BY MOUTH DAILY. PLEASE SCHEDULE APPOINTMENT FOR REFILLS. 90 tablet 0  . levothyroxine (SYNTHROID, LEVOTHROID) 100 MCG tablet TAKE 1 TABLET BY MOUTH DAILY BEFORE BREAKFAST. PLEASE SCHEDULE APPOINTMENT FOR REFILLS 30 tablet 0  . metoprolol (LOPRESSOR) 100 MG tablet TAKE 1 TABLET (100 MG TOTAL) BY MOUTH 2 (TWO) TIMES DAILY. <PLEASE MAKE APPOINTMENT FOR REFILLS> 180 tablet 0  . metoprolol tartrate (LOPRESSOR) 25 MG tablet TAKE 1 TABLET (25 MG TOTAL) BY MOUTH 2 (TWO) TIMES DAILY. PLEASE SCHEDULE APPOINTMENT. 60 tablet 6  . olmesartan (BENICAR) 40 MG tablet TAKE 1 TABLET (40 MG TOTAL) BY MOUTH DAILY. PLEASE CONTACT OFFICE FOR ADDITIONAL REFILLS 30 tablet 0  . spironolactone (ALDACTONE) 25 MG tablet TAKE 1 TABLET (25 MG TOTAL) BY MOUTH 2 (TWO) TIMES DAILY. 180 tablet 0   No current facility-administered medications for this visit.     Allergies:   Amlodipine besylate and Prednisone    Social History:  The patient  reports that she has never smoked. She has never used smokeless tobacco. She reports that she does not drink alcohol or use drugs.   Family History:  The patient's family history includes Hypertension in her father and mother; Stroke in her father.    ROS:  Please  see the history of present illness. All other systems are reviewed and negative.    PHYSICAL EXAM: VS:  BP 120/76   Pulse 64   Ht 5\' 5"  (1.651 m)   Wt 129 lb 3.2 oz (58.6 kg)   BMI 21.50 kg/m  , BMI Body mass index is 21.5 kg/m. GEN: Well nourished, well developed, female in no acute distress  HEENT: normal for age  Neck: no JVD, no carotid bruit, no masses Cardiac: RRR; no murmur, no rubs, or gallops Respiratory:  clear to auscultation bilaterally, normal work of breathing GI: soft, nontender, nondistended, + BS Lindsey: no deformity or atrophy; no  edema; distal pulses are 2+ in all 4 extremities   Skin: warm and dry, no rash Neuro:  Strength and sensation are intact Psych: euthymic mood, full affect   EKG:  EKG is ordered today. The ekg ordered today demonstrates sinus rhythm, rate 64 with no acute ischemic changes and no morphology changes from 2016   Recent Labs: 07/19/2015: ALT 13; BUN 23; Creat 1.01; Potassium 4.9; Sodium 132    Lipid Panel    Component Value Date/Time   CHOL 142 07/19/2015 0951   TRIG 116 07/19/2015 0951   HDL 43 (L) 07/19/2015 0951   CHOLHDL 3.3 07/19/2015 0951   VLDL 23 07/19/2015 0951   LDLCALC 76 07/19/2015 0951     Wt Readings from Last 3 Encounters:  06/04/16 129 lb 3.2 oz (58.6 kg)  05/10/15 140 lb 11.2 oz (63.8 kg)  02/05/15 144 lb 8 oz (65.5 kg)     Other studies Reviewed: Additional studies/ records that were reviewed today include: Office notes and testing.  ASSESSMENT AND PLAN:  1. Hypertension: Her blood pressure control is excellent. No medication changes.  2. Hyperlipidemia: She quit taking the Lipitor because she was afraid of it. I discussed the various options with her and she prefers to stay off Lipitor, get a lipid profile prior to seeing Dr. Claiborne Billings, and then let Dr. Claiborne Billings determine what statin is needed. She has lost weight and feels that she is eating healthier and so hopes that she will need anything.  3. Renal artery stenosis: She has no significant change in her renal artery stenosis by recent ultrasound. However, there was some possible hydronephrosis. She was offered a kidney ultrasound, but prefers to wait and see what the labs show an follow-up with Dr. Claiborne Billings, letting him decide what further testing is needed.   Current medicines are reviewed at length with the patient today.  The patient has concerns regarding medicines. Concerns were addressed  The following changes have been made:  Continue to stay off the Lipitor  Labs/ tests ordered today include:   Orders  Placed This Encounter  Procedures  . Lipid Profile  . COMPLETE METABOLIC PANEL WITH GFR     Disposition:   FU with Dr. Claiborne Billings  Signed, Barrett, Suanne Marker, PA-C  06/04/2016 1:42 PM    [facility] Phone: [phone]; Fax: [phone]  This note was written with the assistance of speech recognition software. Please excuse any transcriptional errors.

## 2016-06-06 ENCOUNTER — Other Ambulatory Visit: Payer: Self-pay | Admitting: *Deleted

## 2016-06-06 ENCOUNTER — Other Ambulatory Visit: Payer: Self-pay | Admitting: Cardiovascular Disease

## 2016-06-06 MED ORDER — SPIRONOLACTONE 25 MG PO TABS: ORAL_TABLET | ORAL | 3 refills | Status: DC

## 2016-06-06 MED ORDER — METOPROLOL TARTRATE 100 MG PO TABS: ORAL_TABLET | ORAL | 3 refills | Status: DC

## 2016-06-06 MED ORDER — EZETIMIBE 10 MG PO TABS: ORAL_TABLET | ORAL | 3 refills | Status: DC

## 2016-06-06 MED ORDER — OLMESARTAN MEDOXOMIL 40 MG PO TABS: ORAL_TABLET | ORAL | 3 refills | Status: DC

## 2016-06-10 NOTE — Addendum Note (Signed)
Addended by: Vennie Homans on: 06/10/2016 11:51 AM   Modules accepted: Orders

## 2016-06-10 NOTE — Telephone Encounter (Signed)
Rx(s) sent to pharmacy electronically.  

## 2016-06-13 ENCOUNTER — Other Ambulatory Visit: Payer: Self-pay | Admitting: Cardiovascular Disease

## 2016-08-06 ENCOUNTER — Other Ambulatory Visit: Payer: Self-pay | Admitting: Cardiovascular Disease

## 2016-08-06 NOTE — Telephone Encounter (Signed)
Rx(s) sent to pharmacy electronically.  

## 2016-08-11 ENCOUNTER — Ambulatory Visit (INDEPENDENT_AMBULATORY_CARE_PROVIDER_SITE_OTHER): Payer: PPO | Admitting: Cardiovascular Disease

## 2016-08-11 ENCOUNTER — Encounter: Payer: Self-pay | Admitting: Cardiovascular Disease

## 2016-08-11 VITALS — BP 146/80 | HR 63 | Ht 64.5 in | Wt 127.8 lb

## 2016-08-11 DIAGNOSIS — E7800 Pure hypercholesterolemia, unspecified: Secondary | ICD-10-CM

## 2016-08-11 DIAGNOSIS — R5382 Chronic fatigue, unspecified: Secondary | ICD-10-CM

## 2016-08-11 DIAGNOSIS — E039 Hypothyroidism, unspecified: Secondary | ICD-10-CM | POA: Diagnosis not present

## 2016-08-11 DIAGNOSIS — I10 Essential (primary) hypertension: Secondary | ICD-10-CM | POA: Diagnosis not present

## 2016-08-11 DIAGNOSIS — I701 Atherosclerosis of renal artery: Secondary | ICD-10-CM

## 2016-08-11 LAB — COMPREHENSIVE METABOLIC PANEL
ALT: 11 U/L (ref 6–29)
AST: 19 U/L (ref 10–35)
Albumin: 4.6 g/dL (ref 3.6–5.1)
Alkaline Phosphatase: 60 U/L (ref 33–130)
BUN: 13 mg/dL (ref 7–25)
CO2: 22 mmol/L (ref 20–31)
Calcium: 10.7 mg/dL — ABNORMAL HIGH (ref 8.6–10.4)
Chloride: 102 mmol/L (ref 98–110)
Creat: 0.78 mg/dL (ref 0.60–0.93)
Glucose, Bld: 100 mg/dL — ABNORMAL HIGH (ref 65–99)
Potassium: 4.9 mmol/L (ref 3.5–5.3)
Sodium: 138 mmol/L (ref 135–146)
Total Bilirubin: 0.8 mg/dL (ref 0.2–1.2)
Total Protein: 7.2 g/dL (ref 6.1–8.1)

## 2016-08-11 LAB — CBC
HCT: 43.2 % (ref 35.0–45.0)
Hemoglobin: 14.7 g/dL (ref 11.7–15.5)
MCH: 30.9 pg (ref 27.0–33.0)
MCHC: 34 g/dL (ref 32.0–36.0)
MCV: 90.9 fL (ref 80.0–100.0)
MPV: 10.2 fL (ref 7.5–12.5)
Platelets: 320 10*3/uL (ref 140–400)
RBC: 4.75 MIL/uL (ref 3.80–5.10)
RDW: 13.2 % (ref 11.0–15.0)
WBC: 7.9 10*3/uL (ref 3.8–10.8)

## 2016-08-11 LAB — LIPID PANEL
Cholesterol: 249 mg/dL — ABNORMAL HIGH (ref <200)
HDL: 59 mg/dL (ref >50)
LDL Cholesterol: 173 mg/dL — ABNORMAL HIGH
Total CHOL/HDL Ratio: 4.2 Ratio (ref <5.0)
Triglycerides: 86 mg/dL (ref <150)
VLDL: 17 mg/dL (ref <30)

## 2016-08-11 LAB — TSH: TSH: 0.32 mIU/L — ABNORMAL LOW

## 2016-08-11 NOTE — Progress Notes (Signed)
Patient ID: Lindsey Goodman, female   DOB: January 24, 1941, 75 y.o.   MRN: [id number]    HPI: Lindsey Goodman is a 75 y.o. female presents for a 15 month followup cardiology evaluation.  Lindsey Goodman has a history of hypertension, hypothyroidism, hyperlipidemia, as well as GERD. Remotely, she developed a cough secondary to ACE inhibition and also developed hyponatremia secondary to hydrochlorothiazide. In March 2013, a renal duplex study demonstrated a greater than 60% diameter reduction in the distal aorta at the origin of the right and left common iliac arteries, and had equal to or greater than 60% diameter reduction in the right renal artery. Her left renal artery have normal patency. Her kidney sizes were normal.  When I last saw last year in 2015 , she was inadvertently taking both Benicar and irbesartan after her insurance company had switched her to irbesartan and was also taking Lopressor 125 mg twice a day and Aldactone 25 mg daily.   She has been on combination therapy with Zetia 10 mg and atorvastatin 40 mg for her hyperlipidemia. She has taken Prilosec 20 mg for her GERD.  At that time, I recommended that she discontinue the irbesartan and resume the Benicar which she had tolerated well and had gotten insurance approval. In December 2015 cholesterol was 141, LDL 81, HDL 46, TG 71.  CBC was stable.  Potassium was normal at 4.6.  Thyroid function studies were normal on her current dose of levothyroxine 100 g daily.  She underwent a follow-up renal duplex study on 01/08/2015.  This showed moderate amount of atherosclerotic plaque at the distal segment of her abdominal aorta consistent with greater than 50% diameter reduction by velocity.  The right renal artery demonstrated elevated velocities consistent with less than 60% diameter reduction.  She had normal left renal artery.    In 2016 follow-up laboratory revealed a total cholesterol 262, LDL 185, HDL 49 VLDL 28, triglycerides 138.  She had had some  issues with medication confusion but states she was taking the atorvastatin and Zetia.  Repeat blood work showed significant improvement in October 2016 with a total cholesterol 142, HDL 43, and LDL 76.  Apparently, since that time.  One day.  She states that she was mentally confused and she read in the package insert that Lipitor can cause this, and as result, she has discontinued this therapy.  She has not had any recent laboratory.  In June 2017.  She underwent renal Doppler study which showed a normal caliber abdominal aorta, there was greater than 50% distal aorta stenosis.  Bilateral kidneys were decreased in size in the left kidney was 1.1 cm smaller than the right.  There was mild right renal artery narrowing of less than 59% with normal left renal artery patency.  She denies any episodes of chest pain.  She is unaware of palpitations.  She presents for evaluation.  Past Medical History:  Diagnosis Date  . GERD (gastroesophageal reflux disease)   . Hx of Doppler ultrasound 12/09/2010   Renal duplexsuggested distal abdominal aorta segment in the origin of the right and left commom iliac arteries with elevated velocities consistent with at least 50% diameter reduction, 60% narrowing in the right renal artery with stable PSV at 210.  Marland Kitchen Hx of echocardiogram 12/09/2010   EF 55% showed mild concrentric LVH with normal systolic function and grade 1 diastolic dysfunction. she had mild to moderate LA dilation, mild to moderate mitral annular calcification with moderate MR, and she had trival tricupid  regurgitation.  . Hyperlipidemia   . Hypertension   . Hypothyroidism     Past Surgical History:  Procedure Laterality Date  . no prior surgery      Allergies  Allergen Reactions  . Atorvastatin Other (See Comments)    Pt states she felt confused.  . Amlodipine Besylate Swelling  . Prednisone     No appetite    Current Outpatient Prescriptions  Medication Sig Dispense Refill  . ezetimibe  (ZETIA) 10 MG tablet Take 1 tablet (10 mg total) by mouth daily. 90 tablet 3  . levothyroxine (SYNTHROID, LEVOTHROID) 100 MCG tablet TAKE 1 TABLET (100 MCG TOTAL) BY MOUTH DAILY BEFORE BREAKFAST. 30 tablet 6  . metoprolol (LOPRESSOR) 100 MG tablet TAKE 1 TABLET (100 MG TOTAL) BY MOUTH 2 (TWO) TIMES DAILY. 180 tablet 3  . metoprolol tartrate (LOPRESSOR) 25 MG tablet Take 1 tablet by mouth 2 (two) times daily.  6  . olmesartan (BENICAR) 40 MG tablet TAKE 1 TABLET (40 MG TOTAL) BY MOUTH DAILY. 90 tablet 3  . spironolactone (ALDACTONE) 25 MG tablet TAKE 1 TABLET (25 MG TOTAL) BY MOUTH 2 (TWO) TIMES DAILY. 180 tablet 3   No current facility-administered medications for this visit.     Social History   Social History  . Marital status: Married    Spouse name: N/A  . Number of children: N/A  . Years of education: N/A   Occupational History  . Not on file.   Social History Main Topics  . Smoking status: Never Smoker  . Smokeless tobacco: Never Used  . Alcohol use No  . Drug use: No  . Sexual activity: Yes    Birth control/ protection: None   Other Topics Concern  . Not on file   Social History Narrative  . No narrative on file    Family History  Problem Relation Age of Onset  . Hypertension Mother   . Hypertension Father   . Stroke Father   . Colon cancer Neg Hx   . Stomach cancer Neg Hx    Social history is notable that she is married has 2 children. He does travel with her husband's work as well as vacation. She does exercise. There is no tobacco or alcohol use.   ROS General: Negative; No fevers, chills, or night sweats;  HEENT: Negative; No changes in vision or hearing, sinus congestion, difficulty swallowing Pulmonary: Negative; No cough, wheezing, shortness of breath, hemoptysis Cardiovascular: Negative; No chest pain, presyncope, syncope, palpatations GI: Negative; No nausea, vomiting, diarrhea, or abdominal pain GU: Negative; No dysuria, hematuria, or difficulty  voiding Musculoskeletal: Negative; no myalgias, joint pain, or weakness Hematologic/Oncology: Mild bruisability; no, bleeding Endocrine: Positive for hypothyroidism; no heat/cold intolerance; no diabetes Neuro: Negative; no changes in balance, headaches Skin: Negative; No rashes or skin lesions Psychiatric: Negative; No behavioral problems, depression Sleep: Positive for previous history of inadequate sleep duration; No snoring, daytime sleepiness, hypersomnolence, bruxism, restless legs, hypnogognic hallucinations, no cataplexy Other comprehensive 14 point system review is negative.  PE BP (!) 146/80 (BP Location: Left Arm, Patient Position: Sitting, Cuff Size: Normal)   Pulse 63   Ht 5' 4.5" (1.638 m)   Wt 127 lb 12.8 oz (58 kg)   BMI 21.60 kg/m   Repeat blood pressure by me was 138/76.  Wt Readings from Last 3 Encounters:  08/11/16 127 lb 12.8 oz (58 kg)  06/04/16 129 lb 3.2 oz (58.6 kg)  05/10/15 140 lb 11.2 oz (63.8 kg)   General: Alert,  oriented, no distress.  Skin: normal turgor, no rashes, small area of ecchymosis in the region of the wrist HEENT: Normocephalic, atraumatic. Pupils round and reactive; sclera anicteric;no lid lag.  Nose without nasal septal hypertrophy Mouth/Parynx benign; Mallinpatti scale 2 Neck: No JVD, no carotid bruits with normal upstroke Lungs: clear to ausculatation and percussion; no wheezing or rales Chest wall: Nontender to palpation Heart: PMI not displaced; RRR, s1 s2 normal 1/6 sem; no S3 or S4 gallop.  No diastolic murmur.  No rubs, thrills or heaves. Abdomen: Harsh mid abdominal bruit ; soft, nontender; no hepatosplenomehaly, BS+; abdominal aorta nontender and not dilated by palpation. Back: No CVA tenderness Pulses 2+ Extremities: no clubbing cyanosis or edema, Homan's sign negative  Neurologic: grossly nonfocal Psychologic: normal affect and mood.  ECG (independently read by me): Normal sinus rhythm at 63 bpm.  Nonspecific ST changes.   QTc interval 427 ms.  August 2016 ECG (independently read by me): Normal sinus rhythm at 62 bpm.  Normal intervals.  Nonspecific ST changes.  May 2016 ECG (independently read by me): Normal sinus rhythm at 61 bpm.  Nonspecific ST changes.  March 2016 ECG (independently read by me): Normal sinus rhythm at 60 bpm.  Nonspecific ST changes.  Normal intervals.  09/05/2014 ECG (independently read by me) : Sinus bradycardia at 54 bpm.  Nonsignificant ST-T changes  May 2015 ECG  (independently read by me): Sinus bradycardia 57 beats per minute.  One isolated PVC.  Normal intervals.  Prior ECG: Sinus bradycardia 57 beats per minute. Normal intervals. No significant ST changes.  LABS:  BMET  BMP Latest Ref Rng & Units 07/19/2015 02/13/2015 01/19/2015  Glucose 65 - 99 mg/dL 97 94 96  BUN 7 - 25 mg/dL '23 13 16  ' Creatinine 0.60 - 0.93 mg/dL 1.01(H) 0.86 0.86  Sodium 135 - 146 mmol/L 132(L) 138 134(L)  Potassium 3.5 - 5.3 mmol/L 4.9 5.0 4.0  Chloride 98 - 110 mmol/L 97(L) 106 99  CO2 20 - 31 mmol/L '25 20 25  ' Calcium 8.6 - 10.4 mg/dL 10.7(H) 10.1 10.7(H)     Hepatic Function Panel   Hepatic Function Latest Ref Rng & Units 07/19/2015 01/19/2015 09/05/2014  Total Protein 6.1 - 8.1 g/dL 7.1 7.3 7.1  Albumin 3.6 - 5.1 g/dL 4.5 4.4 4.4  AST 10 - 35 U/L '20 17 22  ' ALT 6 - 29 U/L '13 11 16  ' Alk Phosphatase 33 - 130 U/L 74 75 88  Total Bilirubin 0.2 - 1.2 mg/dL 0.8 0.6 0.6     CBC  CBC Latest Ref Rng & Units 01/19/2015 09/05/2014 12/14/2013  WBC 4.0 - 10.5 K/uL 6.0 6.8 5.9  Hemoglobin 12.0 - 15.0 g/dL 15.0 14.2 13.6  Hematocrit 36.0 - 46.0 % 44.1 41.2 40.8  Platelets 150 - 400 K/uL 271 284 285     BNP No results found for: PROBNP  Lipid Panel     Component Value Date/Time   CHOL 142 07/19/2015 0951   TRIG 116 07/19/2015 0951   HDL 43 (L) 07/19/2015 0951   CHOLHDL 3.3 07/19/2015 0951   VLDL 23 07/19/2015 0951   LDLCALC 76 07/19/2015 0951     RADIOLOGY: No results  found.    ASSESSMENT AND PLAN: Ms. Scaduto is a 75 years old female with a history of hypertension and documented mild-to-moderate left renal artery stenosis.  A renal duplex scan in March 2013 revealed her PSV was 261 with EDV 55 on the right. He was also found to have narrowing  in the distal segment of her aorta.  I reviewed her most recent renal duplex study from June 2017.  PSV on the right was 209 and on the left was 186, which were slightly improved from previously.  She again was noted to have >50% distal aorta stenosis.  Right renal artery stenosis was in the range of 1-59%.  She was felt to have normal patency to right renal artery.  She did have her left kidney measuring 1.1 cm smaller than her right.  Her blood pressure today is stable on her current regimen of Benicar 40 mg, metoprolol 25 mg twice a day, and spironolactone 25 mg twice a day.  She has hypothyroidism and continues to be on levothyroxine 100 g.  She apparently self discontinued her Lipitor since she had read in the packet summary that it can cause some confusion.  In the past.  She had significant LDL elevation, off statin therapy at 185, which markedly improved with resumption of treatment.  Repeat fasting blood work will be obtained.  If she continues to have significant LDL elevation.  I will reinstitute rosuvastatin at low-dose to see if tolerated.  I am doubtful that the atorvastatin caused the mental confusion.  I will contact her regarding her blood work and adjustments to her medical regimen.  In June 2018.  She will undergo a one-year follow-up renal duplex study.  I will see her in the office for follow-up evaluation.  Time spent: 25 minutes  Troy Sine, MD, [facility]  08/11/2016 11:12 AM

## 2016-08-11 NOTE — Patient Instructions (Signed)
Your physician recommends that you return for lab work in: [place].  Your physician has requested that you have a renal artery duplex and office visit in 1 year.

## 2016-09-03 ENCOUNTER — Telehealth: Payer: Self-pay | Admitting: *Deleted

## 2016-09-03 NOTE — Telephone Encounter (Signed)
Left message to return a call to discuss lab results and recommendations. 

## 2016-09-03 NOTE — Telephone Encounter (Signed)
-----   Message from Troy Sine, MD sent at 08/17/2016  3:38 PM EST ----- Ca mildly elevated but stable at 10.7; lipids increased off statin; start crestor 20 mg daily; TSH oversuppressed; decrease synthroid from 100 ug to 75 ug; re-check in 1 month

## 2016-09-10 ENCOUNTER — Other Ambulatory Visit: Payer: Self-pay | Admitting: Cardiovascular Disease

## 2016-09-17 ENCOUNTER — Other Ambulatory Visit: Payer: Self-pay | Admitting: *Deleted

## 2016-09-17 DIAGNOSIS — E039 Hypothyroidism, unspecified: Secondary | ICD-10-CM

## 2016-09-17 MED ORDER — LEVOTHYROXINE SODIUM 75 MCG PO TABS: 75.0000 ug | ORAL_TABLET | Freq: Every day | ORAL | 6 refills | Status: DC

## 2016-10-08 ENCOUNTER — Other Ambulatory Visit: Payer: Self-pay | Admitting: Cardiovascular Disease

## 2017-01-30 ENCOUNTER — Ambulatory Visit (INDEPENDENT_AMBULATORY_CARE_PROVIDER_SITE_OTHER): Payer: PPO | Admitting: Cardiovascular Disease

## 2017-01-30 ENCOUNTER — Encounter: Payer: Self-pay | Admitting: Cardiovascular Disease

## 2017-01-30 VITALS — BP 178/102 | HR 62 | Ht 65.0 in | Wt 136.8 lb

## 2017-01-30 DIAGNOSIS — I6529 Occlusion and stenosis of unspecified carotid artery: Secondary | ICD-10-CM

## 2017-01-30 DIAGNOSIS — I10 Essential (primary) hypertension: Secondary | ICD-10-CM | POA: Diagnosis not present

## 2017-01-30 DIAGNOSIS — Z79899 Other long term (current) drug therapy: Secondary | ICD-10-CM | POA: Diagnosis not present

## 2017-01-30 DIAGNOSIS — I700 Atherosclerosis of aorta: Secondary | ICD-10-CM | POA: Diagnosis not present

## 2017-01-30 DIAGNOSIS — I701 Atherosclerosis of renal artery: Secondary | ICD-10-CM | POA: Diagnosis not present

## 2017-01-30 DIAGNOSIS — E039 Hypothyroidism, unspecified: Secondary | ICD-10-CM | POA: Diagnosis not present

## 2017-01-30 DIAGNOSIS — E7800 Pure hypercholesterolemia, unspecified: Secondary | ICD-10-CM | POA: Diagnosis not present

## 2017-01-30 DIAGNOSIS — Z87898 Personal history of other specified conditions: Secondary | ICD-10-CM

## 2017-01-30 DIAGNOSIS — Q251 Coarctation of aorta: Secondary | ICD-10-CM

## 2017-01-30 LAB — CBC
HCT: 45.9 % — ABNORMAL HIGH (ref 35.0–45.0)
Hemoglobin: 14.9 g/dL (ref 11.7–15.5)
MCH: 29.4 pg (ref 27.0–33.0)
MCHC: 32.5 g/dL (ref 32.0–36.0)
MCV: 90.7 fL (ref 80.0–100.0)
MPV: 10.5 fL (ref 7.5–12.5)
Platelets: 337 10*3/uL (ref 140–400)
RBC: 5.06 MIL/uL (ref 3.80–5.10)
RDW: 14.6 % (ref 11.0–15.0)
WBC: 7.8 10*3/uL (ref 3.8–10.8)

## 2017-01-30 LAB — COMPREHENSIVE METABOLIC PANEL
ALT: 18 U/L (ref 6–29)
AST: 28 U/L (ref 10–35)
Albumin: 4.5 g/dL (ref 3.6–5.1)
Alkaline Phosphatase: 75 U/L (ref 33–130)
BUN: 11 mg/dL (ref 7–25)
CO2: 24 mmol/L (ref 20–31)
Calcium: 10.2 mg/dL (ref 8.6–10.4)
Chloride: 101 mmol/L (ref 98–110)
Creat: 0.91 mg/dL (ref 0.60–0.93)
Glucose, Bld: 117 mg/dL — ABNORMAL HIGH (ref 65–99)
Potassium: 4.4 mmol/L (ref 3.5–5.3)
Sodium: 140 mmol/L (ref 135–146)
Total Bilirubin: 0.7 mg/dL (ref 0.2–1.2)
Total Protein: 7.2 g/dL (ref 6.1–8.1)

## 2017-01-30 LAB — LIPID PANEL
Cholesterol: 264 mg/dL — ABNORMAL HIGH (ref <200)
HDL: 56 mg/dL (ref >50)
LDL Cholesterol: 183 mg/dL — ABNORMAL HIGH (ref <100)
Total CHOL/HDL Ratio: 4.7 Ratio (ref <5.0)
Triglycerides: 127 mg/dL (ref <150)
VLDL: 25 mg/dL (ref <30)

## 2017-01-30 MED ORDER — AMLODIPINE BESYLATE 5 MG PO TABS: 5.0000 mg | ORAL_TABLET | Freq: Every day | ORAL | 6 refills | Status: DC

## 2017-01-30 MED ORDER — ROSUVASTATIN CALCIUM 40 MG PO TABS: 40.0000 mg | ORAL_TABLET | Freq: Every day | ORAL | 6 refills | Status: DC

## 2017-01-30 MED ORDER — FUROSEMIDE 20 MG PO TABS: 20.0000 mg | ORAL_TABLET | Freq: Every day | ORAL | 6 refills | Status: DC

## 2017-01-30 NOTE — Patient Instructions (Addendum)
Your physician recommends that you return for lab work today.  Your physician has requested that you have an abdominal aorta duplex. During this test, an ultrasound is used to evaluate the aorta. Allow 30 minutes for this exam. Do not eat after midnight the day before and avoid carbonated beverages  Your physician has requested that you have a carotid duplex. This test is an ultrasound of the carotid arteries in your neck. It looks at blood flow through these arteries that supply the brain with blood. Allow one hour for this exam. There are no restrictions or special instructions.  Your physician has requested that you have a renal artery duplex. During this test, an ultrasound is used to evaluate blood flow to the kidneys. Allow one hour for this exam. Do not eat after midnight the day before and avoid carbonated beverages. Take your medications as you usually do.  Your physician has requested that you have a  upper extremity arterial duplex. This test is an ultrasound of the arteries in the legs or arms. It looks at arterial blood flow in the legs and arms. Allow one hour for Lower and Upper Arterial scans. There are no restrictions or special instructions  Your physician has recommended you make the following change in your medication:   1.) start new prescriptions given today for amlodipine 5 mg, rosuvastatin 40 mg and furosemide 20 mg. These prescriptions has been sent [facility].   You have been referred to a NEUROLOGIST to evaluate for cause of your short term memory loss.   Your physician recommends that you schedule a follow-up appointment in: 3-4 weeks with Dr Claiborne Billings.

## 2017-01-30 NOTE — Progress Notes (Signed)
Patient ID: [name], female   DOB: 03-09-1941, 76 y.o.   MRN: [id number]    HPI: YOVANNA COGAN is a 76 y.o. female presents for a 6 month followup cardiology evaluation.  Mrs. Din has a history of hypertension, hypothyroidism, hyperlipidemia, as well as GERD. Remotely, she developed a cough secondary to ACE inhibition and also developed hyponatremia secondary to hydrochlorothiazide. In March 2013, a renal duplex study demonstrated a greater than 60% diameter reduction in the distal aorta at the origin of the right and left common iliac arteries, and had equal to or greater than 60% diameter reduction in the right renal artery. Her left renal artery have normal patency. Her kidney sizes were normal.  When I last saw last year in 2015 , she was inadvertently taking both Benicar and irbesartan after her insurance company had switched her to irbesartan and was also taking Lopressor 125 mg twice a day and Aldactone 25 mg daily.   She has been on combination therapy with Zetia 10 mg and atorvastatin 40 mg for her hyperlipidemia. She has taken Prilosec 20 mg for her GERD.  At that time, I recommended that she discontinue the irbesartan and resume the Benicar which she had tolerated well and had gotten insurance approval. In December 2015 cholesterol was 141, LDL 81, HDL 46, TG 71.  CBC was stable.  Potassium was normal at 4.6.  Thyroid function studies were normal on her current dose of levothyroxine 100 g daily.  She underwent a follow-up renal duplex study on 01/08/2015.  This showed moderate amount of atherosclerotic plaque at the distal segment of her abdominal aorta consistent with greater than 50% diameter reduction by velocity.  The right renal artery demonstrated elevated velocities consistent with less than 60% diameter reduction.  She had normal left renal artery.    In 2016 follow-up laboratory revealed a total cholesterol 262, LDL 185, HDL 49 VLDL 28, triglycerides 138.  She had had some  issues with medication confusion but states she was taking the atorvastatin and Zetia.  Repeat blood work showed significant improvement in October 2016 with a total cholesterol 142, HDL 43, and LDL 76.  Apparently, since that time she was mentally confused and she read in the package insert that Lipitor can cause this, and as result, she has discontinued this therapy.   In June 2017 she underwent renal Doppler study which showed a normal caliber abdominal aorta, there was greater than 50% distal aorta stenosis.  Bilateral kidneys were decreased in size in the left kidney was 1.1 cm smaller than the right.  There was mild right renal artery narrowing of less than 59% with normal left renal artery patency.  Since I last saw her, according to family members, they have noticed issues with memory. She denies any episodes of chest pain.  She is unaware of palpitations.  She has noticed some occasional swelling in her ankles.  She was scheduled to undergo a follow-up renal duplex, but this has not yet been done.  She has not been taking statin therapy and has only been on Zetia 10 mg, levothyroxine 75 mg her hypothyroidism and for hypertension.  She has been on metoprolol tartrate 125 mg twice a day, Benicar 40 mg daily, and spironolactone 25 mg twice a day.  She presents for evaluation.  She presents for evaluation.  Past Medical History:  Diagnosis Date  . GERD (gastroesophageal reflux disease)   . Hx of Doppler ultrasound 12/09/2010   Renal duplexsuggested distal abdominal  aorta segment in the origin of the right and left commom iliac arteries with elevated velocities consistent with at least 50% diameter reduction, 60% narrowing in the right renal artery with stable PSV at 210.  Marland Kitchen Hx of echocardiogram 12/09/2010   EF 55% showed mild concrentric LVH with normal systolic function and grade 1 diastolic dysfunction. she had mild to moderate LA dilation, mild to moderate mitral annular calcification with moderate  MR, and she had trival tricupid regurgitation.  . Hyperlipidemia   . Hypertension   . Hypothyroidism     Past Surgical History:  Procedure Laterality Date  . no prior surgery      Allergies  Allergen Reactions  . Atorvastatin Other (See Comments)    Pt states she felt confused.  . Amlodipine Besylate Swelling  . Prednisone     No appetite    Current Outpatient Prescriptions  Medication Sig Dispense Refill  . ezetimibe (ZETIA) 10 MG tablet Take 1 tablet (10 mg total) by mouth daily. 90 tablet 3  . levothyroxine (SYNTHROID) 75 MCG tablet Take 1 tablet (75 mcg total) by mouth daily before breakfast. 30 tablet 6  . metoprolol (LOPRESSOR) 100 MG tablet TAKE 1 TABLET (100 MG TOTAL) BY MOUTH 2 (TWO) TIMES DAILY. 180 tablet 3  . metoprolol tartrate (LOPRESSOR) 25 MG tablet TAKE 1 TABLET (25 MG TOTAL) BY MOUTH 2 (TWO) TIMES DAILY. PLEASE SCHEDULE APPOINTMENT. 60 tablet 6  . olmesartan (BENICAR) 40 MG tablet TAKE 1 TABLET (40 MG TOTAL) BY MOUTH DAILY. 90 tablet 3  . spironolactone (ALDACTONE) 25 MG tablet TAKE 1 TABLET (25 MG TOTAL) BY MOUTH 2 (TWO) TIMES DAILY. 180 tablet 3  . amLODipine (NORVASC) 5 MG tablet Take 1 tablet (5 mg total) by mouth daily. 30 tablet 6  . furosemide (LASIX) 20 MG tablet Take 1 tablet (20 mg total) by mouth daily. 30 tablet 6  . rosuvastatin (CRESTOR) 40 MG tablet Take 1 tablet (40 mg total) by mouth daily. 30 tablet 6   No current facility-administered medications for this visit.     Social History   Social History  . Marital status: Married    Spouse name: N/A  . Number of children: N/A  . Years of education: N/A   Occupational History  . Not on file.   Social History Main Topics  . Smoking status: Never Smoker  . Smokeless tobacco: Never Used  . Alcohol use No  . Drug use: No  . Sexual activity: Yes    Birth control/ protection: None   Other Topics Concern  . Not on file   Social History Narrative  . No narrative on file    Family  History  Problem Relation Age of Onset  . Hypertension Mother   . Hypertension Father   . Stroke Father   . Colon cancer Neg Hx   . Stomach cancer Neg Hx    Social history is notable that she is married has 2 children. He does travel with her husband's work as well as vacation. She does exercise. There is no tobacco or alcohol use.   ROS General: Negative; No fevers, chills, or night sweats;  HEENT: Negative; No changes in vision or hearing, sinus congestion, difficulty swallowing Pulmonary: Negative; No cough, wheezing, shortness of breath, hemoptysis Cardiovascular: Negative; No chest pain, presyncope, syncope, palpatations GI: Negative; No nausea, vomiting, diarrhea, or abdominal pain GU: Negative; No dysuria, hematuria, or difficulty voiding Musculoskeletal: Negative; no myalgias, joint pain, or weakness Hematologic/Oncology: Mild bruisability; no, bleeding  Endocrine: Positive for hypothyroidism; no heat/cold intolerance; no diabetes Neuro: Possible decline in short-term memory. Skin: Negative; No rashes or skin lesions Psychiatric: Negative; No behavioral problems, depression Sleep: Positive for previous history of inadequate sleep duration; No snoring, daytime sleepiness, hypersomnolence, bruxism, restless legs, hypnogognic hallucinations, no cataplexy Other comprehensive 14 point system review is negative.  PE BP (!) 178/102   Pulse 62   Ht '5\' 5"'  (1.651 m)   Wt 136 lb 12.8 oz (62.1 kg)   BMI 22.76 kg/m    Repeat blood pressure by me was 186/100 in the right arm and 172/96 in the left arm.  Wt Readings from Last 3 Encounters:  01/30/17 136 lb 12.8 oz (62.1 kg)  08/11/16 127 lb 12.8 oz (58 kg)  06/04/16 129 lb 3.2 oz (58.6 kg)   General: Alert, oriented, no distress.  Skin: normal turgor, no rashes, small area of ecchymosis in the region of the wrist HEENT: Normocephalic, atraumatic. Pupils round and reactive; sclera anicteric;no lid lag.  Nose without nasal septal  hypertrophy Mouth/Parynx benign; Mallinpatti scale 2 Neck: No JVD, no carotid bruits with normal upstroke Lungs: clear to ausculatation and percussion; no wheezing or rales Chest wall: Nontender to palpation Heart: PMI not displaced; RRR, s1 s2 normal 1/6 sem; no S3 or S4 gallop.  No diastolic murmur.  No rubs, thrills or heaves. Abdomen: Harsh mid abdominal bruit ; soft, nontender; no hepatosplenomehaly, BS+; abdominal aorta nontender and not dilated by palpation. Back: No CVA tenderness Pulses 2+ Extremities: Trace ankle edema; no clubbing cyanosis, Homan's sign negative  Neurologic: grossly nonfocal Psychologic: normal affect and mood.  ECG (independently read by me): Normal sinus rhythm at 62 bpm.  Left axis deviation.  Nonspecific ST changes in leads 1, aVL, and V6.  November 2017 ECG (independently read by me): Normal sinus rhythm at 63 bpm.  Nonspecific ST changes.  QTc interval 427 ms.  August 2016 ECG (independently read by me): Normal sinus rhythm at 62 bpm.  Normal intervals.  Nonspecific ST changes.  May 2016 ECG (independently read by me): Normal sinus rhythm at 61 bpm.  Nonspecific ST changes.  March 2016 ECG (independently read by me): Normal sinus rhythm at 60 bpm.  Nonspecific ST changes.  Normal intervals.  09/05/2014 ECG (independently read by me) : Sinus bradycardia at 54 bpm.  Nonsignificant ST-T changes  May 2015 ECG  (independently read by me): Sinus bradycardia 57 beats per minute.  One isolated PVC.  Normal intervals.  Prior ECG: Sinus bradycardia 57 beats per minute. Normal intervals. No significant ST changes.  LABS:  BMP Latest Ref Rng & Units 01/30/2017 08/11/2016 07/19/2015  Glucose 65 - 99 mg/dL 117(H) 100(H) 97  BUN 7 - 25 mg/dL '11 13 23  ' Creatinine 0.60 - 0.93 mg/dL 0.91 0.78 1.01(H)  Sodium 135 - 146 mmol/L 140 138 132(L)  Potassium 3.5 - 5.3 mmol/L 4.4 4.9 4.9  Chloride 98 - 110 mmol/L 101 102 97(L)  CO2 20 - 31 mmol/L '24 22 25  ' Calcium 8.6 -  10.4 mg/dL 10.2 10.7(H) 10.7(H)    Hepatic Function Latest Ref Rng & Units 01/30/2017 08/11/2016 07/19/2015  Total Protein 6.1 - 8.1 g/dL 7.2 7.2 7.1  Albumin 3.6 - 5.1 g/dL 4.5 4.6 4.5  AST 10 - 35 U/L '28 19 20  ' ALT 6 - 29 U/L '18 11 13  ' Alk Phosphatase 33 - 130 U/L 75 60 74  Total Bilirubin 0.2 - 1.2 mg/dL 0.7 0.8 0.8    CBC Latest  Ref Rng & Units 01/30/2017 08/11/2016 01/19/2015  WBC 3.8 - 10.8 K/uL 7.8 7.9 6.0  Hemoglobin 11.7 - 15.5 g/dL 14.9 14.7 15.0  Hematocrit 35.0 - 45.0 % 45.9(H) 43.2 44.1  Platelets 140 - 400 K/uL 337 320 271   Lab Results  Component Value Date   MCV 90.7 01/30/2017   MCV 90.9 08/11/2016   MCV 90.2 01/19/2015   Lab Results  Component Value Date   TSH 5.08 (H) 01/30/2017    BNP No results found for: PROBNP  Lipid Panel     Component Value Date/Time   CHOL 264 (H) 01/30/2017 1137   TRIG 127 01/30/2017 1137   HDL 56 01/30/2017 1137   CHOLHDL 4.7 01/30/2017 1137   VLDL 25 01/30/2017 1137   LDLCALC 183 (H) 01/30/2017 1137     RADIOLOGY: No results found.  IMPRESSION:  1. Renal artery stenosis ([place])   2. Essential hypertension   3. Hypothyroidism, unspecified type   4. Pure hypercholesterolemia   5. Abdominal aortic atherosclerosis ([place])   6. Medication management   7. Abdominal aortic stenosis   8. Stenosis of carotid artery, unspecified laterality   9. History of short term memory loss     ASSESSMENT AND PLAN: Ms. Liao is a 76 years old female with a history of hypertension, Hyperlipidemia, hypothyroidism, and documented mild-to-moderate left renal artery stenosis.  A renal duplex scan in March 2013 revealed her PSV was 261 with EDV 55 on the right. He was also found to have narrowing in the distal segment of her aorta.  A follow-up renal duplex study from June 2017 demonstrated PSV on the right  209 and on the left 186, which were slightly improved from previously.  She again was noted to have >50% distal aorta stenosis.  Right renal  artery stenosis was in the range of 1-59%.  She was felt to have normal patency to right renal artery. Her left kidney measured 1.1 cm smaller than her right.  Her blood pressure is significantly elevated today and there is a slight differential between her right and left arm.  She has stage II hypertension.  I am adding amlodipine 5 mg to her medical regimen and I'm adding furosemide 20 mg for improved blood pressure control and for edema.  I am scheduling her for a follow-up renal duplex scan and will also obtain an abdominal aorta.  Evaluation to further evaluate her previously documented greater than 50% distal aortic narrowing.  She may ultimately require a CT abdominal angiogram for further evaluation.  In the past, she stopped taking her atorvastatin.  I do not believe this has led to potential decline in neurocognitive function.  She has a history of hypertension, hyperlipidemia, and certainly possible.  May have small vessel disease in her brain, and potential to have had prior lacunar infarcts.  I have suggested a neurologic evaluation and will refer her to neurology.  I am also scheduling her for carotid study with upper extremity duplex to further evaluate her slight blood pressure differential between her both arms.  Repeat laboratory will be obtained in the fasting state.  This was done following her office visit and her LDL cholesterol is significantly increased at 183.  I will initiate rosuvastatin 40 mg to add to her Zetia 10 mg. She may ultimately a candidate for PCSK9 inhibition.  I will see her in 3-4 weeks for reevaluation.  Time spent: 40 minutes  Troy Sine, MD, [facility]  01/31/2017 11:53 AM

## 2017-01-31 LAB — TSH: TSH: 5.08 mIU/L — ABNORMAL HIGH

## 2017-02-09 ENCOUNTER — Telehealth: Payer: Self-pay | Admitting: *Deleted

## 2017-02-09 NOTE — Telephone Encounter (Signed)
Daughter returned a call to me to discuss lab results. ( patient was there beside her). The daughter voiced understanding of results and recommendations given to her.

## 2017-02-09 NOTE — Telephone Encounter (Signed)
Left message to return a call to discuss lab results and recommendations.

## 2017-02-09 NOTE — Telephone Encounter (Signed)
-----   Message from Troy Sine, MD sent at 01/31/2017 11:55 AM EDT ----- Start rosuvastatin 40 mg with her marked hyperlipidemia.  Continue Zetia 10 mg.  TSH is minimally increased but will keep levothyroxin at present dose for now.

## 2017-02-23 ENCOUNTER — Other Ambulatory Visit: Payer: Self-pay | Admitting: Cardiovascular Disease

## 2017-02-23 DIAGNOSIS — Z0131 Encounter for examination of blood pressure with abnormal findings: Secondary | ICD-10-CM

## 2017-02-26 ENCOUNTER — Ambulatory Visit (HOSPITAL_COMMUNITY)
Admission: RE | Admit: 2017-02-26 | Discharge: 2017-02-26 | Disposition: A | Discharge status: Home / Self Care | Payer: PPO | Source: Ambulatory Visit | Attending: Cardiology | Admitting: Cardiology

## 2017-02-26 DIAGNOSIS — I6523 Occlusion and stenosis of bilateral carotid arteries: Secondary | ICD-10-CM | POA: Insufficient documentation

## 2017-02-26 DIAGNOSIS — Q251 Coarctation of aorta: Secondary | ICD-10-CM | POA: Insufficient documentation

## 2017-02-26 DIAGNOSIS — I6529 Occlusion and stenosis of unspecified carotid artery: Secondary | ICD-10-CM | POA: Diagnosis not present

## 2017-02-26 DIAGNOSIS — I739 Peripheral vascular disease, unspecified: Secondary | ICD-10-CM | POA: Diagnosis not present

## 2017-02-26 DIAGNOSIS — I700 Atherosclerosis of aorta: Secondary | ICD-10-CM | POA: Insufficient documentation

## 2017-03-05 ENCOUNTER — Ambulatory Visit (HOSPITAL_COMMUNITY)
Admission: RE | Admit: 2017-03-05 | Discharge: 2017-03-05 | Disposition: A | Discharge status: Home / Self Care | Payer: PPO | Source: Ambulatory Visit | Attending: Cardiovascular Disease | Admitting: Cardiovascular Disease

## 2017-03-05 DIAGNOSIS — N270 Small kidney, unilateral: Secondary | ICD-10-CM | POA: Diagnosis not present

## 2017-03-05 DIAGNOSIS — R030 Elevated blood-pressure reading, without diagnosis of hypertension: Secondary | ICD-10-CM | POA: Diagnosis not present

## 2017-03-05 DIAGNOSIS — I701 Atherosclerosis of renal artery: Secondary | ICD-10-CM | POA: Diagnosis not present

## 2017-03-05 DIAGNOSIS — K551 Chronic vascular disorders of intestine: Secondary | ICD-10-CM | POA: Insufficient documentation

## 2017-03-05 DIAGNOSIS — I700 Atherosclerosis of aorta: Secondary | ICD-10-CM | POA: Diagnosis not present

## 2017-03-05 DIAGNOSIS — I10 Essential (primary) hypertension: Secondary | ICD-10-CM | POA: Diagnosis not present

## 2017-03-05 DIAGNOSIS — Z0131 Encounter for examination of blood pressure with abnormal findings: Secondary | ICD-10-CM | POA: Diagnosis not present

## 2017-03-06 ENCOUNTER — Ambulatory Visit (INDEPENDENT_AMBULATORY_CARE_PROVIDER_SITE_OTHER): Payer: PPO | Admitting: Diagnostic Neuroimaging

## 2017-03-06 ENCOUNTER — Encounter: Payer: Self-pay | Admitting: Diagnostic Neuroimaging

## 2017-03-06 VITALS — BP 113/69 | HR 60 | Ht 65.5 in | Wt 128.0 lb

## 2017-03-06 DIAGNOSIS — R413 Other amnesia: Secondary | ICD-10-CM

## 2017-03-06 MED ORDER — ALPRAZOLAM 0.5 MG PO TABS: 0.5000 mg | ORAL_TABLET | ORAL | 0 refills | Status: DC | PRN

## 2017-03-06 NOTE — Patient Instructions (Signed)
Thank you for coming to see [place] at [facility]. I hope we have been able to provide you high quality care today.  You may receive a patient satisfaction survey over the next few weeks. We would appreciate your feedback and comments so that we may continue to improve ourselves and the health of our patients.  - check MRI and labs   ~~~~~~~~~~~~~~~~~~~~~~~~~~~~~~~~~~~~~~~~~~~~~~~~~~~~~~~~~~~~~~~~~  DR. Ona Roehrs'S GUIDE TO HAPPY AND HEALTHY LIVING These are some of my general health and wellness recommendations. Some of them may apply to you better than others. Please use common sense as you try these suggestions and feel free to ask me any questions.   ACTIVITY/FITNESS Mental, social, emotional and physical stimulation are very important for brain and body health. Try learning a new activity (arts, music, language, sports, games).  Keep moving your body to the best of your abilities. You can do this at home, inside or outside, the park, community center, gym or anywhere you like. Consider a physical therapist or personal trainer to get started. Consider the app Sworkit. Fitness trackers such as smart-watches, smart-phones or Fitbits can help as well.   NUTRITION Eat more plants: colorful vegetables, nuts, seeds and berries.  Eat less sugar, salt, preservatives and processed foods.  Avoid toxins such as cigarettes and alcohol.  Drink water when you are thirsty. Warm water with a slice of lemon is an excellent morning drink to start the day.  Consider these websites for more information The Nutrition Source (https://www.henry-hernandez.biz/) [employer] Nutrition (WindowBlog.ch)   RELAXATION Consider practicing mindfulness meditation or other relaxation techniques such as deep breathing, prayer, yoga, tai chi, massage. See website mindful.org or the apps Headspace or Calm to help get started.   SLEEP Try to get at least  7-8+ hours sleep per day. Regular exercise and reduced caffeine will help you sleep better. Practice good sleep hygeine techniques. See website sleep.org for more information.   PLANNING Prepare estate planning, living will, healthcare POA documents. Sometimes this is best planned with the help of an attorney. Theconversationproject.org and agingwithdignity.org are excellent resources.

## 2017-03-06 NOTE — Progress Notes (Signed)
[facility]  PATIENT: Lindsey Goodman DOB: [birth year]  REFERRING CLINICIAN: Corky Downs HISTORY FROM: patient and husband  REASON FOR VISIT: new consult    HISTORICAL  CHIEF COMPLAINT:  Chief Complaint  Patient presents with  . Memory Loss    rm 7, New Pt, husbandAlvester Chou, MMSE 23    HISTORY OF PRESENT ILLNESS:   76 year old female here for evaluation of memory loss. Patient has history of hypertension and hypercholesteremia.  According to patient she has noted some mild decreased energy and mild memory problems for past few weeks. She is somewhat dismissive of these issues does not think it is that much of a problem. She tells me that she is able to shop, drive, take care of her own personal needs without any problem.  According to patient's husband and also patient's daughter (via notes and report through her dad) patient has had 6-12 months of gradual onset progressive short-term memory loss, low energy, poor eating habits, repeating herself, confusion, disinterest in activities. Patient's husband saw similar problems in his own mother who was diagnosed with advanced dementia late in life. Patient is concerned about similar diagnosis for his wife.  No specific triggering or aggravating factors. Patient's daughter has taken over management of her medications. Patient still drives short distances on her own. Longer distance travel is managed by patient's husband.    REVIEW OF SYSTEMS: Full 14 system review of systems performed and negative with exception of: Memory loss confusion easy bruising.  ALLERGIES: Allergies  Allergen Reactions  . Atorvastatin Other (See Comments)    Pt states she felt confused.  . Amlodipine Besylate Swelling  . Prednisone     No appetite    HOME MEDICATIONS: Outpatient Medications Prior to Visit  Medication Sig Dispense Refill  . amLODipine (NORVASC) 5 MG tablet Take 1 tablet (5 mg total) by mouth daily. 30 tablet 6  . ezetimibe  (ZETIA) 10 MG tablet Take 1 tablet (10 mg total) by mouth daily. 90 tablet 3  . furosemide (LASIX) 20 MG tablet Take 1 tablet (20 mg total) by mouth daily. 30 tablet 6  . levothyroxine (SYNTHROID) 75 MCG tablet Take 1 tablet (75 mcg total) by mouth daily before breakfast. 30 tablet 6  . metoprolol (LOPRESSOR) 100 MG tablet TAKE 1 TABLET (100 MG TOTAL) BY MOUTH 2 (TWO) TIMES DAILY. 180 tablet 3  . metoprolol tartrate (LOPRESSOR) 25 MG tablet TAKE 1 TABLET (25 MG TOTAL) BY MOUTH 2 (TWO) TIMES DAILY. PLEASE SCHEDULE APPOINTMENT. 60 tablet 6  . olmesartan (BENICAR) 40 MG tablet TAKE 1 TABLET (40 MG TOTAL) BY MOUTH DAILY. 90 tablet 3  . rosuvastatin (CRESTOR) 40 MG tablet Take 1 tablet (40 mg total) by mouth daily. 30 tablet 6  . spironolactone (ALDACTONE) 25 MG tablet TAKE 1 TABLET (25 MG TOTAL) BY MOUTH 2 (TWO) TIMES DAILY. 180 tablet 3   No facility-administered medications prior to visit.     PAST MEDICAL HISTORY: Past Medical History:  Diagnosis Date  . GERD (gastroesophageal reflux disease)   . Hx of Doppler ultrasound 12/09/2010   Renal duplexsuggested distal abdominal aorta segment in the origin of the right and left commom iliac arteries with elevated velocities consistent with at least 50% diameter reduction, 60% narrowing in the right renal artery with stable PSV at 210.  Marland Kitchen Hx of echocardiogram 12/09/2010   EF 55% showed mild concrentric LVH with normal systolic function and grade 1 diastolic dysfunction. she had mild to moderate LA dilation, mild  to moderate mitral annular calcification with moderate MR, and she had trival tricupid regurgitation.  . Hyperlipidemia   . Hypertension   . Hypothyroidism     PAST SURGICAL HISTORY: Past Surgical History:  Procedure Laterality Date  . no prior surgery    . WISDOM TOOTH EXTRACTION  1980    FAMILY HISTORY: Family History  Problem Relation Age of Onset  . Hypertension Mother   . Stroke Mother   . Hypertension Father   . Stroke  Father   . Colon cancer Neg Hx   . Stomach cancer Neg Hx     SOCIAL HISTORY:  Social History   Social History  . Marital status: Married    Spouse name: Alvester Chou  . Number of children: 2  . Years of education: 13   Occupational History  .      retired   Social History Main Topics  . Smoking status: Never Smoker  . Smokeless tobacco: Never Used  . Alcohol use No  . Drug use: No  . Sexual activity: Yes    Birth control/ protection: None   Other Topics Concern  . Not on file   Social History Narrative   Lives with husband   Caffeine- none     PHYSICAL EXAM  GENERAL EXAM/CONSTITUTIONAL: Vitals:  Vitals:   03/06/17 1116  BP: 113/69  Pulse: 60  Weight: 128 lb (58.1 kg)  Height: 5' 5.5" (1.664 m)     Body mass index is 20.98 kg/m.  Visual Acuity Screening   Right eye Left eye Both eyes  Without correction: 20/50 20/50   With correction:        Patient is in no distress; well developed, nourished and groomed; neck is supple  CARDIOVASCULAR:  Examination of carotid arteries is normal; no carotid bruits  Regular rate and rhythm, no murmurs  Examination of peripheral vascular system by observation and palpation is normal  EYES:  Ophthalmoscopic exam of optic discs and posterior segments is normal; no papilledema or hemorrhages  MUSCULOSKELETAL:  Gait, strength, tone, movements noted in Neurologic exam below  NEUROLOGIC: MENTAL STATUS:  MMSE - Mini Mental State Exam 03/06/2017  Orientation to time 4  Orientation to Place 3  Registration 3  Attention/ Calculation 3  Recall 1  Language- name 2 objects 2  Language- repeat 1  Language- follow 3 step command 3  Language- read & follow direction 1  Write a sentence 1  Copy design 1  Total score 23    awake, alert, oriented to person, place and time  recent and remote memory intact  normal attention and concentration  language fluent, comprehension intact, naming intact,   fund of knowledge  appropriate  CRANIAL NERVE:   2nd - no papilledema on fundoscopic exam  2nd, 3rd, 4th, 6th - pupils equal and reactive to light, visual fields full to confrontation, extraocular muscles intact, no nystagmus  5th - facial sensation symmetric  7th - facial strength symmetric  8th - hearing intact  9th - palate elevates symmetrically, uvula midline  11th - shoulder shrug symmetric  12th - tongue protrusion midline  MOTOR:   normal bulk and tone, full strength in the BUE, BLE  SENSORY:   normal and symmetric to light touch, temperature, vibration  COORDINATION:   finger-nose-finger, fine finger movements normal  REFLEXES:   deep tendon reflexes present and symmetric  NEG SNOUT  NEG MYERSONS  GAIT/STATION:   narrow based gait; DIFF WITH TANDEM    DIAGNOSTIC DATA (LABS,  IMAGING, TESTING) - I reviewed patient records, labs, notes, testing and imaging myself where available.  Lab Results  Component Value Date   WBC 7.8 01/30/2017   HGB 14.9 01/30/2017   HCT 45.9 (H) 01/30/2017   MCV 90.7 01/30/2017   PLT 337 01/30/2017      Component Value Date/Time   NA 140 01/30/2017 1137   K 4.4 01/30/2017 1137   CL 101 01/30/2017 1137   CO2 24 01/30/2017 1137   GLUCOSE 117 (H) 01/30/2017 1137   BUN 11 01/30/2017 1137   CREATININE 0.91 01/30/2017 1137   CALCIUM 10.2 01/30/2017 1137   PROT 7.2 01/30/2017 1137   ALBUMIN 4.5 01/30/2017 1137   AST 28 01/30/2017 1137   ALT 18 01/30/2017 1137   ALKPHOS 75 01/30/2017 1137   BILITOT 0.7 01/30/2017 1137   Lab Results  Component Value Date   CHOL 264 (H) 01/30/2017   HDL 56 01/30/2017   LDLCALC 183 (H) 01/30/2017   TRIG 127 01/30/2017   CHOLHDL 4.7 01/30/2017   Lab Results  Component Value Date   HGBA1C 6.1 (H) 07/27/2013   No results found for: VITAMINB12 Lab Results  Component Value Date   TSH 5.08 (H) 01/30/2017       ASSESSMENT AND PLAN  76 y.o. year old female here with gradual onset progressive  short-term memory problems, confusion, repeating herself, decreased energy, with MMSE of 23/30, slightly poor insight. Findings are concerning for possibility of mild dementia. Will check additional testing to rule out other causes.   Ddx: MCI vs mild dementia vs other secondary cause of memory loss  1. Memory loss      PLAN: - check MRI brain - check labs - safety supervision issues reviewed - information and resources re: dementia and memory loss provided to patient and family  Orders Placed This Encounter  Procedures  . MR BRAIN WO CONTRAST  . Vitamin B12   Meds ordered this encounter  Medications  . ALPRAZolam (XANAX) 0.5 MG tablet    Sig: Take 1 tablet (0.5 mg total) by mouth as needed for anxiety (for sedation before MRI scan; take 1 hour before scan; may repeat 15 min before scan).    Dispense:  3 tablet    Refill:  0   Return in about 3 months (around 06/06/2017).    Penni Bombard, MD 02/04/7781, 42:35 AM Certified in Neurology, Neurophysiology and Neuroimaging  [facility] [address] [place], [place] [address] [phone]

## 2017-03-07 LAB — VITAMIN B12: Vitamin B-12: 1329 pg/mL — ABNORMAL HIGH (ref 232–1245)

## 2017-03-10 ENCOUNTER — Telehealth: Payer: Self-pay | Admitting: *Deleted

## 2017-03-10 NOTE — Telephone Encounter (Signed)
-----   Message from Penni Bombard, MD sent at 03/09/2017  4:54 PM EDT ----- Normal labs. Please call patient. -VRP

## 2017-03-10 NOTE — Telephone Encounter (Signed)
Spoke to pt and let her know that lab results normal per Dr. Leta Baptist.  She verbalized understanding.   Pt stated she is very claustrophobic and needs to have open MRI.  I relayed that GI was already sent order.  I would let Raquel Sarna know.

## 2017-03-11 NOTE — Telephone Encounter (Signed)
[facility] has tried to call her twice one on 03/08/17 at 03/10/17. They left her a voicemail both time's for her to call back to schedule the MRI. They have a bigger machine that holds someone that is 500 lbs.

## 2017-03-11 NOTE — Telephone Encounter (Signed)
Noted  

## 2017-03-11 NOTE — Telephone Encounter (Signed)
LMVM for pt that GI does have machine for pt that are claustrophobic.  They have LM for you twice to call back.  I use [phone], but they may have used a different #.  Please call back to schedule.

## 2017-03-12 ENCOUNTER — Encounter: Payer: Self-pay | Admitting: Cardiovascular Disease

## 2017-03-12 ENCOUNTER — Ambulatory Visit (INDEPENDENT_AMBULATORY_CARE_PROVIDER_SITE_OTHER): Payer: PPO | Admitting: Cardiovascular Disease

## 2017-03-12 VITALS — BP 140/62 | HR 51 | Ht 65.0 in | Wt 126.0 lb

## 2017-03-12 DIAGNOSIS — E785 Hyperlipidemia, unspecified: Secondary | ICD-10-CM

## 2017-03-12 DIAGNOSIS — Z79899 Other long term (current) drug therapy: Secondary | ICD-10-CM | POA: Diagnosis not present

## 2017-03-12 DIAGNOSIS — F03A0 Unspecified dementia, mild, without behavioral disturbance, psychotic disturbance, mood disturbance, and anxiety: Secondary | ICD-10-CM

## 2017-03-12 DIAGNOSIS — F0390 Unspecified dementia without behavioral disturbance: Secondary | ICD-10-CM

## 2017-03-12 DIAGNOSIS — I10 Essential (primary) hypertension: Secondary | ICD-10-CM

## 2017-03-12 DIAGNOSIS — I700 Atherosclerosis of aorta: Secondary | ICD-10-CM | POA: Diagnosis not present

## 2017-03-12 DIAGNOSIS — Q251 Coarctation of aorta: Secondary | ICD-10-CM | POA: Diagnosis not present

## 2017-03-12 DIAGNOSIS — I951 Orthostatic hypotension: Secondary | ICD-10-CM | POA: Diagnosis not present

## 2017-03-12 DIAGNOSIS — I6529 Occlusion and stenosis of unspecified carotid artery: Secondary | ICD-10-CM

## 2017-03-12 NOTE — Progress Notes (Signed)
Patient ID: Lindsey Goodman, female   DOB: 08-Oct-1940, 76 y.o.   MRN: [id number]    HPI: Lindsey Goodman is a 76 y.o. female presents for a 2 month followup cardiology evaluation.  Lindsey Goodman has a history of hypertension, hypothyroidism, hyperlipidemia, as well as GERD. Remotely, she developed a cough secondary to ACE inhibition and also developed hyponatremia secondary to hydrochlorothiazide. In March 2013, a renal duplex study demonstrated a greater than 60% diameter reduction in the distal aorta at the origin of the right and left common iliac arteries, and had equal to or greater than 60% diameter reduction in the right renal artery. Her left renal artery have normal patency. Her kidney sizes were normal.  When I last saw last year in 2015 , she was inadvertently taking both Benicar and irbesartan after her insurance company had switched her to irbesartan and was also taking Lopressor 125 mg twice a day and Aldactone 25 mg daily.   She has been on combination therapy with Zetia 10 mg and atorvastatin 40 mg for her hyperlipidemia. She has taken Prilosec 20 mg for her GERD.  At that time, I recommended that she discontinue the irbesartan and resume the Benicar which she had tolerated well and had gotten insurance approval. In December 2015 cholesterol was 141, LDL 81, HDL 46, TG 71.  CBC was stable.  Potassium was normal at 4.6.  Thyroid function studies were normal on her current dose of levothyroxine 100 g daily.  She underwent a follow-up renal duplex study on 01/08/2015.  This showed moderate amount of atherosclerotic plaque at the distal segment of her abdominal aorta consistent with greater than 50% diameter reduction by velocity.  The right renal artery demonstrated elevated velocities consistent with less than 60% diameter reduction.  She had normal left renal artery.    In 2016 follow-up laboratory revealed a total cholesterol 262, LDL 185, HDL 49 VLDL 28, triglycerides 138.  She had had some  issues with medication confusion but states she was taking the atorvastatin and Zetia.  Repeat blood work showed significant improvement in October 2016 with a total cholesterol 142, HDL 43, and LDL 76.  Apparently, since that time she was mentally confused and she read in the package insert that Lipitor can cause this, and as result, she has discontinued this therapy.   In June 2017 she underwent renal Doppler study which showed a normal caliber abdominal aorta, there was greater than 50% distal aorta stenosis.  Bilateral kidneys were decreased in size in the left kidney was 1.1 cm smaller than the right.  There was mild right renal artery narrowing of less than 59% with normal left renal artery patency.  When I last saw her ,  family members, had noticed issues with memory. She denied any episodes of chest pain or palpitations.  She had noticed some occasional swelling in her ankles.   She has not been taking statin therapy and has only been on Zetia 10 mg, levothyroxine 75 mg her hypothyroidism and for hypertension  metoprolol tartrate 125 mg twice a day, Benicar 40 mg daily, and spironolactone 25 mg twice a day.  When  I saw her, she had stage II hypertension and I added amlodipine 5 mg to medical regimen and also added furosemide for both blood pressure control and leg swelling.  I scheduled her for follow-up renal duplex scan and evaluation of abdominal aorta.  I recommended.  Due to concerns for possible dementia that she undergo a neurologic evaluation.  A renal duplex study showed normal caliber abdominal aorta, but there was a stable greater than 50% distal abdominal aortic stenosis.  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  Her kidney size is stable compared to prior exam in the right kidney measured 0.6 cm larger than the left kidney.  There was a decrease in right renal artery velocities compared to prior exam and these were now the normal range.  Carotid duplex imaging showed [place]ius plaque  bilaterally with 1 at 39% bilateral ICA stenoses.  Should normal antegrade flow.  She saw Dr. Christinia Gully for neurologic assessment was concern for probable mild dementia.  However, she is scheduled to undergo an MRI of her brain for additional testing.  She presents for evaluation.  Past Medical History:  Diagnosis Date  . GERD (gastroesophageal reflux disease)   . Hx of Doppler ultrasound 12/09/2010   Renal duplexsuggested distal abdominal aorta segment in the origin of the right and left commom iliac arteries with elevated velocities consistent with at least 50% diameter reduction, 60% narrowing in the right renal artery with stable PSV at 210.  Marland Kitchen Hx of echocardiogram 12/09/2010   EF 55% showed mild concrentric LVH with normal systolic function and grade 1 diastolic dysfunction. she had mild to moderate LA dilation, mild to moderate mitral annular calcification with moderate MR, and she had trival tricupid regurgitation.  . Hyperlipidemia   . Hypertension   . Hypothyroidism     Past Surgical History:  Procedure Laterality Date  . no prior surgery    . WISDOM TOOTH EXTRACTION  1980    Allergies  Allergen Reactions  . Atorvastatin Other (See Comments)    Pt states she felt confused.  . Amlodipine Besylate Swelling  . Prednisone     No appetite    Current Outpatient Prescriptions  Medication Sig Dispense Refill  . ALPRAZolam (XANAX) 0.5 MG tablet Take 1 tablet (0.5 mg total) by mouth as needed for anxiety (for sedation before MRI scan; take 1 hour before scan; may repeat 15 min before scan). 3 tablet 0  . amLODipine (NORVASC) 5 MG tablet Take 1 tablet (5 mg total) by mouth daily. 30 tablet 6  . ezetimibe (ZETIA) 10 MG tablet Take 1 tablet (10 mg total) by mouth daily. 90 tablet 3  . levothyroxine (SYNTHROID) 75 MCG tablet Take 1 tablet (75 mcg total) by mouth daily before breakfast. 30 tablet 6  . metoprolol (LOPRESSOR) 100 MG tablet TAKE 1 TABLET (100 MG TOTAL) BY MOUTH 2 (TWO)  TIMES DAILY. 180 tablet 3  . olmesartan (BENICAR) 40 MG tablet TAKE 1 TABLET (40 MG TOTAL) BY MOUTH DAILY. 90 tablet 3  . rosuvastatin (CRESTOR) 40 MG tablet Take 1 tablet (40 mg total) by mouth daily. 30 tablet 6  . spironolactone (ALDACTONE) 25 MG tablet TAKE 1 TABLET (25 MG TOTAL) BY MOUTH 2 (TWO) TIMES DAILY. 180 tablet 3   No current facility-administered medications for this visit.     Social History   Social History  . Marital status: Married    Spouse name: Alvester Chou  . Number of children: 2  . Years of education: 13   Occupational History  .      retired   Social History Main Topics  . Smoking status: Never Smoker  . Smokeless tobacco: Never Used  . Alcohol use No  . Drug use: No  . Sexual activity: Yes    Birth control/ protection: None   Other Topics Concern  . Not  on file   Social History Narrative   Lives with husband   Caffeine- none    Family History  Problem Relation Age of Onset  . Hypertension Mother   . Stroke Mother   . Hypertension Father   . Stroke Father   . Colon cancer Neg Hx   . Stomach cancer Neg Hx    Social history is notable that she is married has 2 children. He does travel with her husband's work as well as vacation. She does exercise. There is no tobacco or alcohol use.   ROS General: Negative; No fevers, chills, or night sweats;  HEENT: Negative; No changes in vision or hearing, sinus congestion, difficulty swallowing Pulmonary: Negative; No cough, wheezing, shortness of breath, hemoptysis Cardiovascular: Negative; No chest pain, presyncope, syncope, palpatations GI: Negative; No nausea, vomiting, diarrhea, or abdominal pain GU: Negative; No dysuria, hematuria, or difficulty voiding Musculoskeletal: Negative; no myalgias, joint pain, or weakness Hematologic/Oncology: Mild bruisability; no, bleeding Endocrine: Positive for hypothyroidism; no heat/cold intolerance; no diabetes Neuro: Possible decline in short-term memory. Skin:  Negative; No rashes or skin lesions Psychiatric: Negative; No behavioral problems, depression Sleep: Positive for previous history of inadequate sleep duration; No snoring, daytime sleepiness, hypersomnolence, bruxism, restless legs, hypnogognic hallucinations, no cataplexy Other comprehensive 14 point system review is negative.  PE BP 140/62   Pulse (!) 51   Ht '5\' 5"'  (1.651 m)   Wt 126 lb (57.2 kg)   BMI 20.97 kg/m    Repeat blood pressure by me was 122/68 supine and 80/64 standing  Wt Readings from Last 3 Encounters:  03/12/17 126 lb (57.2 kg)  03/06/17 128 lb (58.1 kg)  01/30/17 136 lb 12.8 oz (62.1 kg)    General: Alert, oriented, no distress.  Skin: normal turgor, no rashes, warm and dry HEENT: Normocephalic, atraumatic. Pupils equal round and reactive to light; sclera anicteric; extraocular muscles intact;  Nose without nasal septal hypertrophy Mouth/Parynx benign; Mallinpatti scale 2 Neck: No JVD, no carotid bruits; normal carotid upstroke Lungs: clear to ausculatation and percussion; no wheezing or rales Chest wall: without tenderness to palpitation Heart: PMI not displaced, RRR, s1 s2 normal, 1/6 systolic murmur, no diastolic murmur, no rubs, gallops, thrills, or heaves Abdomen: soft, nontender; no hepatosplenomehaly, BS+; abdominal aorta nontender and not dilated by palpation. Back: no CVA tenderness Pulses 2+ Musculoskeletal: full range of motion, normal strength, no joint deformities Extremities: Resolution of prior edema; no clubbing cyanosis  Homan's sign negative  Neurologic: grossly nonfocal; Cranial nerves grossly wnl Psychologic: Normal mood and affect  ECG (independently read by me): Sinus bradycardia 51 bpm.  Nonspecific ST-T changes inferolaterally.  QTc interval 435 ms.  April 2018 ECG (independently read by me): Normal sinus rhythm at 62 bpm.  Left axis deviation.  Nonspecific ST changes in leads 1, aVL, and V6.  November 2017 ECG (independently read  by me): Normal sinus rhythm at 63 bpm.  Nonspecific ST changes.  QTc interval 427 ms.  August 2016 ECG (independently read by me): Normal sinus rhythm at 62 bpm.  Normal intervals.  Nonspecific ST changes.  May 2016 ECG (independently read by me): Normal sinus rhythm at 61 bpm.  Nonspecific ST changes.  March 2016 ECG (independently read by me): Normal sinus rhythm at 60 bpm.  Nonspecific ST changes.  Normal intervals.  09/05/2014 ECG (independently read by me) : Sinus bradycardia at 54 bpm.  Nonsignificant ST-T changes  May 2015 ECG  (independently read by me): Sinus bradycardia 57 beats per minute.  One isolated PVC.  Normal intervals.  Prior ECG: Sinus bradycardia 57 beats per minute. Normal intervals. No significant ST changes.  LABS:  BMP Latest Ref Rng & Units 01/30/2017 08/11/2016 07/19/2015  Glucose 65 - 99 mg/dL 117(H) 100(H) 97  BUN 7 - 25 mg/dL '11 13 23  ' Creatinine 0.60 - 0.93 mg/dL 0.91 0.78 1.01(H)  Sodium 135 - 146 mmol/L 140 138 132(L)  Potassium 3.5 - 5.3 mmol/L 4.4 4.9 4.9  Chloride 98 - 110 mmol/L 101 102 97(L)  CO2 20 - 31 mmol/L '24 22 25  ' Calcium 8.6 - 10.4 mg/dL 10.2 10.7(H) 10.7(H)    Hepatic Function Latest Ref Rng & Units 01/30/2017 08/11/2016 07/19/2015  Total Protein 6.1 - 8.1 g/dL 7.2 7.2 7.1  Albumin 3.6 - 5.1 g/dL 4.5 4.6 4.5  AST 10 - 35 U/L '28 19 20  ' ALT 6 - 29 U/L '18 11 13  ' Alk Phosphatase 33 - 130 U/L 75 60 74  Total Bilirubin 0.2 - 1.2 mg/dL 0.7 0.8 0.8    CBC Latest Ref Rng & Units 01/30/2017 08/11/2016 01/19/2015  WBC 3.8 - 10.8 K/uL 7.8 7.9 6.0  Hemoglobin 11.7 - 15.5 g/dL 14.9 14.7 15.0  Hematocrit 35.0 - 45.0 % 45.9(H) 43.2 44.1  Platelets 140 - 400 K/uL 337 320 271   Lab Results  Component Value Date   MCV 90.7 01/30/2017   MCV 90.9 08/11/2016   MCV 90.2 01/19/2015   Lab Results  Component Value Date   TSH 5.08 (H) 01/30/2017    BNP No results found for: PROBNP  Lipid Panel     Component Value Date/Time   CHOL 264 (H)  01/30/2017 1137   TRIG 127 01/30/2017 1137   HDL 56 01/30/2017 1137   CHOLHDL 4.7 01/30/2017 1137   VLDL 25 01/30/2017 1137   LDLCALC 183 (H) 01/30/2017 1137     RADIOLOGY: No results found.  IMPRESSION:  1. Essential hypertension   2. Medication management   3. Orthostatic hypotension   4. Abdominal aortic atherosclerosis ([place])   5. Abdominal aortic stenosis   6. Stenosis of carotid artery, unspecified laterality   7. Hyperlipidemia with target LDL less than 70   8. Mild dementia     ASSESSMENT AND PLAN: Ms. 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  A renal duplex scan in March 2013 revealed her PSV was 261 with EDV 55 on the right. He was also found to have narrowing in the distal segment of her aorta.  A follow-up renal duplex study from June 2017 demonstrated PSV on the right  209 and on the left 186, which were slightly improved from previously.  She again was noted to have >50% distal aorta stenosis.  Right renal artery stenosis was in the range of 1-59%.  She was felt to have normal patency to right renal artery. Her left kidney measured 1.1 cm smaller than her right.  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  There was a decrease in right renal artery velocities compared to prior exam and now these are in the normal range.  When I last saw her, she had stage II hypertension, with slight differential between right and left arms.  Her carotid study revealed normal subclavian arteries bilaterally.  She had patent vertebral arteries, and mild bilateral ICA stenosis  of less than 39%.  On exam today, she has mild orthostatic hypotension.  Her peripheral edema has resolved.  I'm discontinuing furosemide.  I am reducing her metoprolol down to 100 mg  twice a day from previously 125 twice a day.  She will be undergoing an MRI of her brain for further evaluation of her probable mild dementia.  She is taken Zetia 10 mg and Crestor now at 40 mg daily for her significant hyperlipidemia.  I will see her in 3 months for cardiology reevaluation. Time spent: 25 minutes  Troy Sine, MD, [facility]  03/14/2017 8:24 PM

## 2017-03-12 NOTE — Patient Instructions (Signed)
Your physician has recommended you make the following change in your medication:   1.) STOP the furosemide.  2.) the metoprolol ( lopressor) has been changed to 100 mg twice a day.  Your physician recommends that you schedule a follow-up appointment in: 3 months

## 2017-03-27 ENCOUNTER — Ambulatory Visit
Admission: RE | Admit: 2017-03-27 | Discharge: 2017-03-27 | Disposition: A | Discharge status: Home / Self Care | Payer: PPO | Source: Ambulatory Visit | Attending: Diagnostic Neuroimaging | Admitting: Diagnostic Neuroimaging

## 2017-03-27 DIAGNOSIS — R413 Other amnesia: Secondary | ICD-10-CM | POA: Diagnosis not present

## 2017-04-05 ENCOUNTER — Other Ambulatory Visit: Payer: Self-pay | Admitting: Cardiovascular Disease

## 2017-04-10 ENCOUNTER — Telehealth: Payer: Self-pay | Admitting: *Deleted

## 2017-04-10 NOTE — Telephone Encounter (Signed)
LMVM for pt tor pt to return call for MRI results at her convenience.  Close today at 1200.

## 2017-04-10 NOTE — Telephone Encounter (Signed)
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 Otherwise unremarkable study. No other major findings. Please call patient. Continue current plan. -VRP

## 2017-04-13 ENCOUNTER — Telehealth: Payer: Self-pay | Admitting: *Deleted

## 2017-04-13 NOTE — Telephone Encounter (Signed)
Spoke to pt and relayed that MRI showed mild atrophy (chronic SVD (causes diabetes, hypertension, high cholesterol, normal aging). Other wise unremarkable study.  No other major findings.  Continue current plan,  See in 07-01-17 as scheduled.  Will redo MMSE and see if change.  She verbalized understanding.

## 2017-04-13 NOTE — Telephone Encounter (Signed)
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 Otherwise unremarkable study. No other major findings. Please call patient. Continue current plan. -VRP

## 2017-04-13 NOTE — Telephone Encounter (Signed)
Spoke to pt and relayed the results.  See result note.

## 2017-04-13 NOTE — Telephone Encounter (Signed)
Patient called office returning RN's call.  Please call °

## 2017-06-22 ENCOUNTER — Other Ambulatory Visit: Payer: Self-pay | Admitting: *Deleted

## 2017-06-22 MED ORDER — OLMESARTAN MEDOXOMIL 40 MG PO TABS: ORAL_TABLET | ORAL | 1 refills | Status: DC

## 2017-06-22 NOTE — Telephone Encounter (Signed)
Rx has been sent to the pharmacy electronically. ° °

## 2017-06-26 ENCOUNTER — Ambulatory Visit: Payer: PPO | Admitting: Cardiovascular Disease

## 2017-07-01 ENCOUNTER — Encounter: Payer: Self-pay | Admitting: Diagnostic Neuroimaging

## 2017-07-01 ENCOUNTER — Ambulatory Visit (INDEPENDENT_AMBULATORY_CARE_PROVIDER_SITE_OTHER): Payer: PPO | Admitting: Diagnostic Neuroimaging

## 2017-07-01 VITALS — BP 118/68 | HR 58 | Ht 65.0 in | Wt 117.0 lb

## 2017-07-01 DIAGNOSIS — R413 Other amnesia: Secondary | ICD-10-CM | POA: Diagnosis not present

## 2017-07-01 DIAGNOSIS — F0390 Unspecified dementia without behavioral disturbance: Secondary | ICD-10-CM | POA: Diagnosis not present

## 2017-07-01 DIAGNOSIS — F03A0 Unspecified dementia, mild, without behavioral disturbance, psychotic disturbance, mood disturbance, and anxiety: Secondary | ICD-10-CM

## 2017-07-01 MED ORDER — MEMANTINE HCL 10 MG PO TABS: 10.0000 mg | ORAL_TABLET | Freq: Two times a day (BID) | ORAL | 12 refills | Status: DC

## 2017-07-01 NOTE — Progress Notes (Signed)
[facility]  PATIENT: Lindsey Goodman DOB: 03-01-1941  REFERRING CLINICIAN: Corky Downs HISTORY FROM: patient and husband and daughter REASON FOR VISIT: follow up    HISTORICAL  CHIEF COMPLAINT:  Chief Complaint  Patient presents with  . Follow-up  . Memory Loss    daughter and husband with pt.  Memory worse. MMSE 19    HISTORY OF PRESENT ILLNESS:   UPDATE (07/01/17, VRP): Since last visit, doing worse. More short term memory loss. Cannot use remote control. Diff with her pills. Cannot put away dishes properly. No longer driving. Diff with conversations. More weight loss. Poor appetite.   PRIOR HPI (03/06/17): 76 year old female here for evaluation of memory loss. Patient has history of hypertension and hypercholesteremia. According to patient she has noted some mild decreased energy and mild memory problems for past few weeks. She is somewhat dismissive of these issues does not think it is that much of a problem. She tells me that she is able to shop, drive, take care of her own personal needs without any problem. According to patient's husband and also patient's daughter (via notes and report through her dad) patient has had 6-12 months of gradual onset progressive short-term memory loss, low energy, poor eating habits, repeating herself, confusion, disinterest in activities. Patient's husband saw similar problems in his own mother who was diagnosed with advanced dementia late in life. Patient is concerned about similar diagnosis for his wife. No specific triggering or aggravating factors. Patient's daughter has taken over management of her medications. Patient still drives short distances on her own. Longer distance travel is managed by patient's husband.    REVIEW OF SYSTEMS: Full 14 system review of systems performed and negative with exception of: memory loss dizziness heat intolerance unexpected wt change.   ALLERGIES: Allergies  Allergen Reactions  . Atorvastatin  Other (See Comments)    Pt states she felt confused.  . Amlodipine Besylate Swelling  . Prednisone     No appetite    HOME MEDICATIONS: Outpatient Medications Prior to Visit  Medication Sig Dispense Refill  . ALPRAZolam (XANAX) 0.5 MG tablet Take 1 tablet (0.5 mg total) by mouth as needed for anxiety (for sedation before MRI scan; take 1 hour before scan; may repeat 15 min before scan). 3 tablet 0  . ezetimibe (ZETIA) 10 MG tablet Take 1 tablet (10 mg total) by mouth daily. 90 tablet 3  . levothyroxine (SYNTHROID, LEVOTHROID) 75 MCG tablet TAKE 1 TABLET (75 MCG TOTAL) BY MOUTH DAILY BEFORE BREAKFAST. 30 tablet 6  . metoprolol (LOPRESSOR) 100 MG tablet TAKE 1 TABLET (100 MG TOTAL) BY MOUTH 2 (TWO) TIMES DAILY. 180 tablet 3  . olmesartan (BENICAR) 40 MG tablet TAKE 1 TABLET (40 MG TOTAL) BY MOUTH DAILY. 90 tablet 1  . spironolactone (ALDACTONE) 25 MG tablet TAKE 1 TABLET (25 MG TOTAL) BY MOUTH 2 (TWO) TIMES DAILY. 180 tablet 3  . amLODipine (NORVASC) 5 MG tablet Take 1 tablet (5 mg total) by mouth daily. 30 tablet 6  . rosuvastatin (CRESTOR) 40 MG tablet Take 1 tablet (40 mg total) by mouth daily. 30 tablet 6   No facility-administered medications prior to visit.     PAST MEDICAL HISTORY: Past Medical History:  Diagnosis Date  . GERD (gastroesophageal reflux disease)   . Hx of Doppler ultrasound 12/09/2010   Renal duplexsuggested distal abdominal aorta segment in the origin of the right and left commom iliac arteries with elevated velocities consistent with at least 50% diameter reduction,  60% narrowing in the right renal artery with stable PSV at 210.  Marland Kitchen Hx of echocardiogram 12/09/2010   EF 55% showed mild concrentric LVH with normal systolic function and grade 1 diastolic dysfunction. she had mild to moderate LA dilation, mild to moderate mitral annular calcification with moderate MR, and she had trival tricupid regurgitation.  . Hyperlipidemia   . Hypertension   . Hypothyroidism      PAST SURGICAL HISTORY: Past Surgical History:  Procedure Laterality Date  . no prior surgery    . WISDOM TOOTH EXTRACTION  1980    FAMILY HISTORY: Family History  Problem Relation Age of Onset  . Hypertension Mother   . Stroke Mother   . Hypertension Father   . Stroke Father   . Colon cancer Neg Hx   . Stomach cancer Neg Hx     SOCIAL HISTORY:  Social History   Social History  . Marital status: Married    Spouse name: Alvester Chou  . Number of children: 2  . Years of education: 13   Occupational History  .      retired   Social History Main Topics  . Smoking status: Never Smoker  . Smokeless tobacco: Never Used  . Alcohol use No  . Drug use: No  . Sexual activity: Yes    Birth control/ protection: None   Other Topics Concern  . Not on file   Social History Narrative   Lives with husband   Caffeine- none     PHYSICAL EXAM  GENERAL EXAM/CONSTITUTIONAL: Vitals:  Vitals:   07/01/17 0934  BP: 118/68  Pulse: (!) 58  Weight: 117 lb (53.1 kg)  Height: 5\' 5"  (1.651 m)   Body mass index is 19.47 kg/m. No exam data present  Patient is in no distress; well developed, nourished and groomed; neck is supple  CARDIOVASCULAR:  Examination of carotid arteries is normal; no carotid bruits  Regular rate and rhythm, no murmurs  Examination of peripheral vascular system by observation and palpation is normal  EYES:  Ophthalmoscopic exam of optic discs and posterior segments is normal; no papilledema or hemorrhages  MUSCULOSKELETAL:  Gait, strength, tone, movements noted in Neurologic exam below  NEUROLOGIC: MENTAL STATUS:  MMSE - Mini Mental State Exam 07/01/2017 03/06/2017  Orientation to time 3 4  Orientation to Place 4 3  Registration 3 3  Attention/ Calculation 0 3  Recall 1 1  Language- name 2 objects 2 2  Language- repeat 1 1  Language- follow 3 step command 3 3  Language- read & follow direction 1 1  Write a sentence 1 1  Copy design 0 1   Total score 19 23    awake, alert, oriented to person, place and time  [facility] memory  DECR attention and concentration  DECR FLUENCY, comprehension intact, naming intact  fund of knowledge appropriate  CRANIAL NERVE:   2nd - no papilledema on fundoscopic exam  2nd, 3rd, 4th, 6th - pupils equal and reactive to light, visual fields full to confrontation, extraocular muscles intact, no nystagmus  5th - facial sensation symmetric  7th - facial strength symmetric  8th - hearing intact  9th - palate elevates symmetrically, uvula midline  11th - shoulder shrug symmetric  12th - tongue protrusion midline  MOTOR:   normal bulk and tone, full strength in the BUE, BLE  SENSORY:   normal and symmetric to light touch, temperature, vibration  COORDINATION:   finger-nose-finger, fine finger movements normal  REFLEXES:  deep tendon reflexes present and symmetric  NEG SNOUT  NEG MYERSONS  GAIT/STATION:   narrow based gait; ROMBERG NEG    DIAGNOSTIC DATA (LABS, IMAGING, TESTING) - I reviewed patient records, labs, notes, testing and imaging myself where available.  Lab Results  Component Value Date   WBC 7.8 01/30/2017   HGB 14.9 01/30/2017   HCT 45.9 (H) 01/30/2017   MCV 90.7 01/30/2017   PLT 337 01/30/2017      Component Value Date/Time   NA 140 01/30/2017 1137   K 4.4 01/30/2017 1137   CL 101 01/30/2017 1137   CO2 24 01/30/2017 1137   GLUCOSE 117 (H) 01/30/2017 1137   BUN 11 01/30/2017 1137   CREATININE 0.91 01/30/2017 1137   CALCIUM 10.2 01/30/2017 1137   PROT 7.2 01/30/2017 1137   ALBUMIN 4.5 01/30/2017 1137   AST 28 01/30/2017 1137   ALT 18 01/30/2017 1137   ALKPHOS 75 01/30/2017 1137   BILITOT 0.7 01/30/2017 1137   Lab Results  Component Value Date   CHOL 264 (H) 01/30/2017   HDL 56 01/30/2017   LDLCALC 183 (H) 01/30/2017   TRIG 127 01/30/2017   CHOLHDL 4.7 01/30/2017   Lab Results  Component Value Date   HGBA1C 6.1 (H) 07/27/2013    Lab Results  Component Value Date   VITAMINB12 1,329 (H) 03/06/2017   Lab Results  Component Value Date   TSH 5.08 (H) 01/30/2017    03/27/17 MRI brain [I reviewed images myself and agree with interpretation. -VRP]  1. Mild generalized cortical atrophy 2. Multiple T2/FLAIR hyperintense foci in the hemispheres pattern most consistent with moderate chronic microvascular ischemic change. None of the foci appeared to be acute. 3. The sella turcica is mildly enlarged, this is likely to be an incidental finding but can also be seen with elevated intracranial pressure          ASSESSMENT AND PLAN  76 y.o. year old female here with gradual onset progressive short-term memory problems, confusion, repeating herself, decreased energy, with MMSE of 23/30 --> 19/30, poor insight. 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   Dx: mild dementia  1. Mild dementia   2. Memory loss      PLAN:  I spent 25 minutes of face to face time with patient. Greater than 50% of time was spent in counseling and coordination of care with patient. In summary we discussed:   - start memantine 10mg  twice a day  - may consider SSRI in future if depression or anxiety worsen - safety supervision issues reviewed - information and resources re: dementia and memory loss provided to patient and family  Meds ordered this encounter  Medications  . memantine (NAMENDA) 10 MG tablet    Sig: Take 1 tablet (10 mg total) by mouth 2 (two) times daily.    Dispense:  60 tablet    Refill:  12   Return in about 6 months (around 12/29/2017) for with NP or Penumalli.     Penni Bombard, MD 04/11/2375, 28:31 AM Certified in Neurology, Neurophysiology and Neuroimaging  [facility] [address], [place], [place] [address] [phone]

## 2017-07-01 NOTE — Patient Instructions (Signed)
Thank you for coming to see [place] at [facility]. I hope we have been able to provide you high quality care today.  You may receive a patient satisfaction survey over the next few weeks. We would appreciate your feedback and comments so that we may continue to improve ourselves and the health of our patients.  - start memantine 20m at bedtime; after 1-2 weeks increase to twice a day   - visit [email or web address] more information   ~~~~~~~~~~~~~~~~~~~~~~~~~~~~~~~~~~~~~~~~~~~~~~~~~~~~~~~~~~~~~~~~~  DR. Rana Adorno'S GUIDE TO HAPPY AND HEALTHY LIVING These are some of my general health and wellness recommendations. Some of them may apply to you better than others. Please use common sense as you try these suggestions and feel free to ask me any questions.   ACTIVITY/FITNESS Mental, social, emotional and physical stimulation are very important for brain and body health. Try learning a new activity (arts, music, language, sports, games).  Keep moving your body to the best of your abilities. You can do this at home, inside or outside, the park, community center, gym or anywhere you like. Consider a physical therapist or personal trainer to get started. Consider the app Sworkit. Fitness trackers such as smart-watches, smart-phones or Fitbits can help as well.   NUTRITION Eat more plants: colorful vegetables, nuts, seeds and berries.  Eat less sugar, salt, preservatives and processed foods.  Avoid toxins such as cigarettes and alcohol.  Drink water when you are thirsty. Warm water with a slice of lemon is an excellent morning drink to start the day.  Consider these websites for more information [employer] (hhttps://www.henry-hernandez.biz/ [employer] (wWindowBlog.ch   RELAXATION Consider practicing mindfulness meditation or other relaxation techniques such as deep breathing, prayer, yoga, tai chi, massage. See website  mindful.org or the apps Headspace or Calm to help get started.   SLEEP Try to get at least 7-8+ hours sleep per day. Regular exercise and reduced caffeine will help you sleep better. Practice good sleep hygeine techniques. See website sleep.org for more information.   PLANNING Prepare estate planning, living will, healthcare POA documents. Sometimes this is best planned with the help of an attorney. Theconversationproject.org and agingwithdignity.org are excellent resources.

## 2017-07-16 ENCOUNTER — Other Ambulatory Visit: Payer: Self-pay

## 2017-07-16 MED ORDER — ROSUVASTATIN CALCIUM 40 MG PO TABS: 40.0000 mg | ORAL_TABLET | Freq: Every day | ORAL | 1 refills | Status: DC

## 2017-07-20 ENCOUNTER — Other Ambulatory Visit: Payer: Self-pay

## 2017-07-20 MED ORDER — METOPROLOL TARTRATE 100 MG PO TABS: ORAL_TABLET | ORAL | 3 refills | Status: DC

## 2017-07-21 ENCOUNTER — Other Ambulatory Visit: Payer: Self-pay | Admitting: *Deleted

## 2017-07-21 ENCOUNTER — Other Ambulatory Visit: Payer: Self-pay

## 2017-07-21 MED ORDER — EZETIMIBE 10 MG PO TABS: 10.0000 mg | ORAL_TABLET | Freq: Every day | ORAL | 3 refills | Status: DC

## 2017-07-21 MED ORDER — SPIRONOLACTONE 25 MG PO TABS
ORAL_TABLET | ORAL | 2 refills | Status: DC
Start: 2017-07-21 — End: 2017-09-10

## 2017-08-19 ENCOUNTER — Other Ambulatory Visit: Payer: Self-pay | Admitting: Cardiovascular Disease

## 2017-09-10 ENCOUNTER — Encounter: Payer: Self-pay | Admitting: Cardiovascular Disease

## 2017-09-10 ENCOUNTER — Ambulatory Visit (INDEPENDENT_AMBULATORY_CARE_PROVIDER_SITE_OTHER): Payer: PPO | Admitting: Cardiovascular Disease

## 2017-09-10 VITALS — BP 130/70 | HR 56 | Ht 65.0 in | Wt 114.0 lb

## 2017-09-10 DIAGNOSIS — E039 Hypothyroidism, unspecified: Secondary | ICD-10-CM

## 2017-09-10 DIAGNOSIS — I6529 Occlusion and stenosis of unspecified carotid artery: Secondary | ICD-10-CM | POA: Diagnosis not present

## 2017-09-10 DIAGNOSIS — E785 Hyperlipidemia, unspecified: Secondary | ICD-10-CM | POA: Diagnosis not present

## 2017-09-10 DIAGNOSIS — I10 Essential (primary) hypertension: Secondary | ICD-10-CM

## 2017-09-10 DIAGNOSIS — Z79899 Other long term (current) drug therapy: Secondary | ICD-10-CM | POA: Diagnosis not present

## 2017-09-10 DIAGNOSIS — I701 Atherosclerosis of renal artery: Secondary | ICD-10-CM | POA: Diagnosis not present

## 2017-09-10 DIAGNOSIS — F0390 Unspecified dementia without behavioral disturbance: Secondary | ICD-10-CM | POA: Diagnosis not present

## 2017-09-10 DIAGNOSIS — I700 Atherosclerosis of aorta: Secondary | ICD-10-CM | POA: Diagnosis not present

## 2017-09-10 DIAGNOSIS — F03A0 Unspecified dementia, mild, without behavioral disturbance, psychotic disturbance, mood disturbance, and anxiety: Secondary | ICD-10-CM

## 2017-09-10 NOTE — Patient Instructions (Signed)
Medication Instructions:  Your physician recommends that you continue on your current medications as directed. Please refer to the Current Medication list given to you today.  Labwork: TODAY (CMET, CBC, Lipid, TSH)  Follow-Up: Your physician wants you to follow-up in: 6 months with Dr. Claiborne Billings. You will receive a reminder letter in the mail two months in advance. If you don't receive a letter, please call our office to schedule the follow-up appointment.    If you need a refill on your cardiac medications before your next appointment, please call your pharmacy.

## 2017-09-10 NOTE — Progress Notes (Signed)
Patient ID: Lindsey Goodman, female   DOB: [birth year], 76 y.o.   MRN: [id number]    HPI: Lindsey Goodman is a 76 y.o. female presents for a 6 month followup cardiology evaluation.  Lindsey Goodman has a history of hypertension, hypothyroidism, hyperlipidemia, as well as GERD. Remotely, Lindsey Goodman developed a cough secondary to ACE inhibition and also developed hyponatremia secondary to hydrochlorothiazide. In March 2013, a renal duplex study demonstrated a greater than 60% diameter reduction in the distal aorta at the origin of the right and left common iliac arteries, and had equal to or greater than 60% diameter reduction in the right renal artery. Lindsey Goodman left renal artery have normal patency. Lindsey Goodman kidney sizes were normal.  When I last saw last year in 2015 , Lindsey Goodman was inadvertently taking both Benicar and irbesartan after Lindsey Goodman insurance company had switched Lindsey Goodman to irbesartan and was also taking Lopressor 125 mg twice a day and Aldactone 25 mg daily.   Lindsey Goodman has been on combination therapy with Zetia 10 mg and atorvastatin 40 mg for Lindsey Goodman hyperlipidemia. Lindsey Goodman has taken Prilosec 20 mg for Lindsey Goodman GERD.  At that time, I recommended that Lindsey Goodman discontinue the irbesartan and resume the Benicar which Lindsey Goodman had tolerated well and had gotten insurance approval. In December 2015 cholesterol was 141, LDL 81, HDL 46, TG 71.  CBC was stable.  Potassium was normal at 4.6.  Thyroid function studies were normal on Lindsey Goodman current dose of levothyroxine 100 g daily.  Lindsey Goodman underwent a follow-up renal duplex study on 01/08/2015.  This showed moderate amount of atherosclerotic plaque at the distal segment of Lindsey Goodman abdominal aorta consistent with greater than 50% diameter reduction by velocity.  The right renal artery demonstrated elevated velocities consistent with less than 60% diameter reduction.  Lindsey Goodman had normal left renal artery.    In 2016 follow-up laboratory revealed a total cholesterol 262, LDL 185, HDL 49 VLDL 28, triglycerides 138.  Lindsey Goodman had had some  issues with medication confusion but states Lindsey Goodman was taking the atorvastatin and Zetia.  Repeat blood work showed significant improvement in October 2016 with a total cholesterol 142, HDL 43, and LDL 76.  Apparently, since that time Lindsey Goodman was mentally confused and Lindsey Goodman read in the package insert that Lipitor can cause this, and as result, Lindsey Goodman has discontinued this therapy.   In June 2017 Lindsey Goodman underwent renal Doppler study which showed a normal caliber abdominal aorta, there was greater than 50% distal aorta stenosis.  Bilateral kidneys were decreased in size in the left kidney was 1.1 cm smaller than the right.  There was mild right renal artery narrowing of less than 59% with normal left renal artery patency.  When I last saw he ,  family members, had noticed issues with memory. Lindsey Goodman denied any episodes of chest pain or palpitations.  Lindsey Goodman had noticed some occasional swelling in Lindsey Goodman ankles.   Lindsey Goodman has not been taking statin therapy and has only been on Zetia 10 mg, levothyroxine 75 mg Lindsey Goodman hypothyroidism and for hypertension  metoprolol tartrate 125 mg twice a day, Benicar 40 mg daily, and spironolactone 25 mg twice a day.  When  I saw Lindsey Goodman, Lindsey Goodman had stage II hypertension and I added amlodipine 5 mg to medical regimen and also added furosemide for both blood pressure control and leg swelling.  I scheduled Lindsey Goodman for follow-up renal duplex scan and evaluation of abdominal aorta.  I recommended.  Due to concerns for possible dementia that Lindsey Goodman undergo a neurologic evaluation.  A renal duplex study showed normal caliber abdominal aorta, but there was a stable greater than 50% distal abdominal aortic stenosis.  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  Lindsey Goodman kidney size is stable compared to prior exam in the right kidney measured 0.6 cm larger than the left kidney.  There was a decrease in right renal artery velocities compared to prior exam and these were now the normal range.  Carotid duplex imaging showed heterogeneous plaque  bilaterally with 1 at 39% bilateral ICA stenoses.  Vertebrals showed normal antegrade flow.  Lindsey Goodman saw Dr. Leta Baptist for neurologic assessment who was concerned for probable mild dementia.  Lindsey Goodman is now on Namenda.  Over the past 6 months, chronic cardiac standpoint, Lindsey Goodman has been fairly stable.  There was some question concerning spironolactone.  Apparently, Lindsey Goodman has not been taking this but when Lindsey Goodman recently switched pharmacies this was renewed.  Lindsey Goodman has been taking amlodipine 5 mg, olmesartan 40 mg and metoprolol 100 mg twice a day for hypertension.  At times Lindsey Goodman has had some trouble with dizziness and balance.  Lindsey Goodman also admits to fatigability.  Lindsey Goodman presents for reevaluation.  Past Medical History:  Diagnosis Date  . GERD (gastroesophageal reflux disease)   . Hx of Doppler ultrasound 12/09/2010   Renal duplexsuggested distal abdominal aorta segment in the origin of the right and left commom iliac arteries with elevated velocities consistent with at least 50% diameter reduction, 60% narrowing in the right renal artery with stable PSV at 210.  Marland Kitchen Hx of echocardiogram 12/09/2010   EF 55% showed mild concrentric LVH with normal systolic function and grade 1 diastolic dysfunction. Lindsey Goodman had mild to moderate LA dilation, mild to moderate mitral annular calcification with moderate MR, and Lindsey Goodman had trival tricupid regurgitation.  . Hyperlipidemia   . Hypertension   . Hypothyroidism     Past Surgical History:  Procedure Laterality Date  . no prior surgery    . WISDOM TOOTH EXTRACTION  1980    Allergies  Allergen Reactions  . Atorvastatin Other (See Comments)    Pt states Lindsey Goodman felt confused.  . Amlodipine Besylate Swelling  . Prednisone     No appetite    Current Outpatient Medications  Medication Sig Dispense Refill  . ALPRAZolam (XANAX) 0.5 MG tablet Take 1 tablet (0.5 mg total) by mouth as needed for anxiety (for sedation before MRI scan; take 1 hour before scan; may repeat 15 min before scan). 3  tablet 0  . amLODipine (NORVASC) 5 MG tablet TAKE 1 TABLET BY MOUTH EVERY DAY 30 tablet 5  . ezetimibe (ZETIA) 10 MG tablet Take 1 tablet (10 mg total) by mouth daily. 90 tablet 3  . levothyroxine (SYNTHROID, LEVOTHROID) 75 MCG tablet TAKE 1 TABLET (75 MCG TOTAL) BY MOUTH DAILY BEFORE BREAKFAST. 30 tablet 6  . memantine (NAMENDA) 10 MG tablet Take 1 tablet (10 mg total) by mouth 2 (two) times daily. 60 tablet 12  . metoprolol tartrate (LOPRESSOR) 100 MG tablet TAKE 1 TABLET (100 MG TOTAL) BY MOUTH 2 (TWO) TIMES DAILY. 180 tablet 3  . olmesartan (BENICAR) 40 MG tablet TAKE 1 TABLET (40 MG TOTAL) BY MOUTH DAILY. 90 tablet 1  . rosuvastatin (CRESTOR) 40 MG tablet Take 1 tablet (40 mg total) by mouth daily. 90 tablet 1   No current facility-administered medications for this visit.     Social History   Socioeconomic History  . Marital status: Married    Spouse name: Alvester Chou  . Number of children:  2  . Years of education: 27  . Highest education level: Not on file  Social Needs  . Financial resource strain: Not on file  . Food insecurity - worry: Not on file  . Food insecurity - inability: Not on file  . Transportation needs - medical: Not on file  . Transportation needs - non-medical: Not on file  Occupational History    Comment: retired  Tobacco Use  . Smoking status: Never Smoker  . Smokeless tobacco: Never Used  Substance and Sexual Activity  . Alcohol use: No  . Drug use: No  . Sexual activity: Yes    Birth control/protection: None  Other Topics Concern  . Not on file  Social History Narrative   Lives with husband   Caffeine- none    Family History  Problem Relation Age of Onset  . Hypertension Mother   . Stroke Mother   . Hypertension Father   . Stroke Father   . Colon cancer Neg Hx   . Stomach cancer Neg Hx    Social history is notable that Lindsey Goodman is married has 2 children. He does travel with Lindsey Goodman husband's work as well as vacation. Lindsey Goodman does exercise. There is no  tobacco or alcohol use.   ROS General: Negative; No fevers, chills, or night sweats;  HEENT: Negative; No changes in vision or hearing, sinus congestion, difficulty swallowing Pulmonary: Negative; No cough, wheezing, shortness of breath, hemoptysis Cardiovascular: Negative; No chest pain, presyncope, syncope, palpatations GI: Negative; No nausea, vomiting, diarrhea, or abdominal pain GU: Negative; No dysuria, hematuria, or difficulty voiding Musculoskeletal: Negative; no myalgias, joint pain, or weakness Hematologic/Oncology: Mild bruisability; no, bleeding Endocrine: Positive for hypothyroidism; no heat/cold intolerance; no diabetes Neuro: Possible decline in short-term memory. Skin: Occasional welts on Lindsey Goodman arms. Psychiatric: Negative; No behavioral problems, depression Sleep: Positive for previous history of inadequate sleep duration; No snoring, daytime sleepiness, hypersomnolence, bruxism, restless legs, hypnogognic hallucinations, no cataplexy Other comprehensive 14 point system review is negative.  PE BP 130/70   Pulse (!) 56   Ht 5' 5" (1.651 m)   Wt 114 lb (51.7 kg)   BMI 18.97 kg/m    Repeat blood pressure by me was 122/70 without orthostatic drop.  Wt Readings from Last 3 Encounters:  09/10/17 114 lb (51.7 kg)  07/01/17 117 lb (53.1 kg)  03/12/17 126 lb (57.2 kg)   General: Alert, oriented, no distress.  Skin: normal turgor, no rashes, warm and dry HEENT: Normocephalic, atraumatic. Pupils equal round and reactive to light; sclera anicteric; extraocular muscles intact;  Nose without nasal septal hypertrophy Mouth/Parynx benign; Mallinpatti scale 2 Neck: No JVD, no carotid bruits; normal carotid upstroke Lungs: clear to ausculatation and percussion; no wheezing or rales Chest wall: without tenderness to palpitation Heart: PMI not displaced, RRR, s1 s2 normal, 1/6 systolic murmur, no diastolic murmur, no rubs, gallops, thrills, or heaves Abdomen: soft, nontender; no  hepatosplenomehaly, BS+; abdominal aorta nontender and not dilated by palpation. Back: no CVA tenderness Pulses 2+ Musculoskeletal: full range of motion, normal strength, no joint deformities Extremities: no clubbing cyanosis or edema, Homan's sign negative  Neurologic: grossly nonfocal; Cranial nerves grossly wnl Psychologic: Normal mood and affect   ECG (independently read by me): Wandering baseline, but probably sinus rhythm at 56 bpm.  LVH by voltage criteria.  June 2018 ECG (independently read by me): Sinus bradycardia 51 bpm.  Nonspecific ST-T changes inferolaterally.  QTc interval 435 ms.  April 2018 ECG (independently read by me): Normal sinus rhythm  at 62 bpm.  Left axis deviation.  Nonspecific ST changes in leads 1, aVL, and V6.  November 2017 ECG (independently read by me): Normal sinus rhythm at 63 bpm.  Nonspecific ST changes.  QTc interval 427 ms.  August 2016 ECG (independently read by me): Normal sinus rhythm at 62 bpm.  Normal intervals.  Nonspecific ST changes.  May 2016 ECG (independently read by me): Normal sinus rhythm at 61 bpm.  Nonspecific ST changes.  March 2016 ECG (independently read by me): Normal sinus rhythm at 60 bpm.  Nonspecific ST changes.  Normal intervals.  09/05/2014 ECG (independently read by me) : Sinus bradycardia at 54 bpm.  Nonsignificant ST-T changes  May 2015 ECG  (independently read by me): Sinus bradycardia 57 beats per minute.  One isolated PVC.  Normal intervals.  Prior ECG: Sinus bradycardia 57 beats per minute. Normal intervals. No significant ST changes.  LABS:  BMP Latest Ref Rng & Units 01/30/2017 08/11/2016 07/19/2015  Glucose 65 - 99 mg/dL 117(H) 100(H) 97  BUN 7 - 25 mg/dL _0 Creatinine 0.60 - 0.93 mg/dL 0.91 0.78 1.01(H)  Sodium 135 - 146 mmol/L 140 138 132(L)  Potassium 3.5 - 5.3 mmol/L 4.4 4.9 4.9  Chloride 98 - 110 mmol/L 101 102 97(L)  CO2 20 - 31 mmol/L _1 Calcium 8.6 - 10.4 mg/dL 10.2 10.7(H) 10.7(H)     Hepatic Function Latest Ref Rng & Units 01/30/2017 08/11/2016 07/19/2015  Total Protein 6.1 - 8.1 g/dL 7.2 7.2 7.1  Albumin 3.6 - 5.1 g/dL 4.5 4.6 4.5  AST 10 - 35 U/L _2 ALT 6 - 29 U/L _3 Alk Phosphatase 33 - 130 U/L 75 60 74  Total Bilirubin 0.2 - 1.2 mg/dL 0.7 0.8 0.8    CBC Latest Ref Rng & Units 01/30/2017 08/11/2016 01/19/2015  WBC 3.8 - 10.8 K/uL 7.8 7.9 6.0  Hemoglobin 11.7 - 15.5 g/dL 14.9 14.7 15.0  Hematocrit 35.0 - 45.0 % 45.9(H) 43.2 44.1  Platelets 140 - 400 K/uL 337 320 271   Lab Results  Component Value Date   MCV 90.7 01/30/2017   MCV 90.9 08/11/2016   MCV 90.2 01/19/2015   Lab Results  Component Value Date   TSH 5.08 (H) 01/30/2017    BNP No results found for: PROBNP  Lipid Panel     Component Value Date/Time   CHOL 264 (H) 01/30/2017 1137   TRIG 127 01/30/2017 1137   HDL 56 01/30/2017 1137   CHOLHDL 4.7 01/30/2017 1137   VLDL 25 01/30/2017 1137   LDLCALC 183 (H) 01/30/2017 1137     RADIOLOGY: No results found.  IMPRESSION:  1. Abdominal aortic atherosclerosis ([place])   2. Essential hypertension   3. Hyperlipidemia with target LDL less than 70   4. Hypothyroidism, unspecified type   5. Medication management   6. Renal artery stenosis ([facility])   7. Stenosis of carotid artery, unspecified laterality   8. Mild dementia     ASSESSMENT AND PLAN: Lindsey Goodman is a 76 years old female with a history of hypertension, hyperlipidemia, hypothyroidism, and documented mild-to-moderate left renal artery stenosis.  A renal duplex scan in March 2013 revealed Lindsey Goodman PSV was 261 with EDV 55 on the right. He was also found to have narrowing in the distal segment of Lindsey Goodman aorta.  A follow-up renal duplex study from June 2017 demonstrated PSV on the right  209 and on the left 186, which were slightly  improved from previously.  Lindsey Goodman again was noted to have >50% distal aorta stenosis.  Right renal artery stenosis was in the range of 1-59%.  Lindsey Goodman was felt to have  normal patency to right renal artery. Lindsey Goodman left kidney measured 1.1 cm smaller than Lindsey Goodman right.  Lindsey Goodman most recent renal duplex study was without significant change and again showed greater than 50% distal abdominal aortic stenosis with greater than 70% SMA stenosis.  There was a decrease in right renal artery velocities compared to prior exam and now these are in the normal range.  When I last saw Lindsey Goodman, Lindsey Goodman had stage II hypertension, with slight differential between right and left arms.  Lindsey Goodman carotid study revealed normal subclavian arteries bilaterally.  Lindsey Goodman had patent vertebral arteries, and mild bilateral ICA stenosis of less than 39%.  When I last saw Lindsey Goodman, Lindsey Goodman was mildly orthostatic and I discontinued furosemide.  Apparently, since January.  Lindsey Goodman has not been taking spironolactone.  Lindsey Goodman has only been taking olmesartan 40 mg, amlodipine 5 mg, and metoprolol 100 mg twice a day for blood pressure.  Lindsey Goodman blood pressure today is well controlled.  I have recommended that Lindsey Goodman stay off the spironolactone.  Lindsey Goodman is felt to have mild dementia and currently is on Namenda 10 mg twice a day.  Lindsey Goodman has had significant hyperlipidemia with prior LDL cholesterols as high as 183 seven months ago.  At present Lindsey Goodman is on rosuvastatin 40 mg and Zetia 10 mg.  I will repeat fasting laboratory to see if there has been significant improvement.  If not, Lindsey Goodman will be a candidate for Repatha.  I answered many questions from Lindsey Goodman husband.  There is also future potential discussion relative to advanced directives with instructions for mental health treatment as suggested by Lindsey Goodman daughter.  I will see Lindsey Goodman in 6 months for cardiology reevaluation or sooner if problems arise.   Time spent: 25 minutes  Troy Sine, MD, [facility]  09/11/2017 12:22 PM

## 2017-10-20 DIAGNOSIS — E785 Hyperlipidemia, unspecified: Secondary | ICD-10-CM | POA: Diagnosis not present

## 2017-10-20 DIAGNOSIS — I10 Essential (primary) hypertension: Secondary | ICD-10-CM | POA: Diagnosis not present

## 2017-10-20 DIAGNOSIS — Z79899 Other long term (current) drug therapy: Secondary | ICD-10-CM | POA: Diagnosis not present

## 2017-10-20 DIAGNOSIS — I700 Atherosclerosis of aorta: Secondary | ICD-10-CM | POA: Diagnosis not present

## 2017-10-20 DIAGNOSIS — E039 Hypothyroidism, unspecified: Secondary | ICD-10-CM | POA: Diagnosis not present

## 2017-10-20 LAB — SPECIMEN STATUS REPORT

## 2017-10-21 ENCOUNTER — Telehealth: Payer: Self-pay | Admitting: *Deleted

## 2017-10-21 NOTE — Telephone Encounter (Signed)
Spoke with Rickard Patience lab tech regarding patient. She had labs done yesterday however she was a very difficult stick and she was unable to get CBC. Will forward to Dr Claiborne Billings for review

## 2017-10-22 ENCOUNTER — Other Ambulatory Visit: Payer: Self-pay

## 2017-10-22 ENCOUNTER — Telehealth: Payer: Self-pay

## 2017-10-22 DIAGNOSIS — R899 Unspecified abnormal finding in specimens from other organs, systems and tissues: Secondary | ICD-10-CM

## 2017-10-22 DIAGNOSIS — I10 Essential (primary) hypertension: Secondary | ICD-10-CM

## 2017-10-22 NOTE — Telephone Encounter (Signed)
Left message for pt to call back about lab results. Currently they are in providers box, when reviewed this morning some were abnormal and we want to redraw to double check. Orders placed for STAT BMET and CBC that was unable to be drawn earlier this week.

## 2017-10-22 NOTE — Telephone Encounter (Signed)
Left message on pt cell to come in as soon as she can to get lab recheck. She can call us if she has questions.

## 2017-10-22 NOTE — Telephone Encounter (Signed)
See telephone note 1/17-message has been left for patient to have repeat BMET and CBC.

## 2017-10-22 NOTE — Progress Notes (Signed)
This encounter was created in error - please disregard.

## 2017-10-24 LAB — COMPREHENSIVE METABOLIC PANEL
ALT: 26 IU/L (ref 0–32)
AST: 38 IU/L (ref 0–40)
Albumin/Globulin Ratio: 1.6 (ref 1.2–2.2)
Albumin: 4.5 g/dL (ref 3.5–4.8)
Alkaline Phosphatase: 76 IU/L (ref 39–117)
BUN/Creatinine Ratio: 14 (ref 12–28)
BUN: 19 mg/dL (ref 8–27)
Bilirubin Total: 0.5 mg/dL (ref 0.0–1.2)
CO2: 24 mmol/L (ref 20–29)
Calcium: 10.2 mg/dL (ref 8.7–10.3)
Chloride: 99 mmol/L (ref 96–106)
Creatinine, Ser: 1.33 mg/dL — ABNORMAL HIGH (ref 0.57–1.00)
GFR calc Af Amer: 45 mL/min/{1.73_m2} — ABNORMAL LOW (ref >59)
GFR calc non Af Amer: 39 mL/min/{1.73_m2} — ABNORMAL LOW (ref >59)
Globulin, Total: 2.8 g/dL (ref 1.5–4.5)
Glucose: 94 mg/dL (ref 65–99)
Potassium: 2.5 mmol/L — ABNORMAL LOW (ref 3.5–5.2)
Sodium: 148 mmol/L — ABNORMAL HIGH (ref 134–144)
Total Protein: 7.3 g/dL (ref 6.0–8.5)

## 2017-10-24 LAB — CBC

## 2017-10-24 LAB — LIPID PANEL
Chol/HDL Ratio: 2.6 ratio (ref 0.0–4.4)
Cholesterol, Total: 165 mg/dL (ref 100–199)
HDL: 63 mg/dL (ref >39)
LDL Calculated: 80 mg/dL (ref 0–99)
Triglycerides: 108 mg/dL (ref 0–149)
VLDL Cholesterol Cal: 22 mg/dL (ref 5–40)

## 2017-10-24 LAB — TSH: TSH: 4.33 u[IU]/mL (ref 0.450–4.500)

## 2017-10-27 ENCOUNTER — Telehealth: Payer: Self-pay | Admitting: *Deleted

## 2017-10-27 NOTE — Telephone Encounter (Signed)
Left message on cell and home VM to call office ASAP in regards to labs.    Emergency contact # is home # for patient that was attempted.

## 2017-10-28 NOTE — Telephone Encounter (Signed)
Left message to call back  

## 2017-10-30 NOTE — Progress Notes (Signed)
[facility]  PATIENT: Lindsey Goodman DOB: 1941/01/21   REASON FOR VISIT: Follow-up for short-term memory loss  HISTORY FROM: Patient husband and daughter    HISTORY OF PRESENT ILLNESS:UPDATE 1/28/2019CM Lindsey Goodman, 77 year old female returns for follow-up with short-term memory loss.  She was started on Namenda in September by Dr. Leta Baptist tolerating the medication okay.  Memory continues to decline according to the daughter and husband.  She gets no regular exercise she does not read the paper.  She watches TV most of the day.  She is independent with ADLs.  She no longer drives.  She no longer cooks.  She returns for reevaluation  UPDATE (07/01/17, VRP): Since last visit, doing worse. More short term memory loss. Cannot use remote control. Diff with her pills. Cannot put away dishes properly. No longer driving. Diff with conversations. More weight loss. Poor appetite.   PRIOR HPI (03/06/17): 77 year old female here for evaluation of memory loss. Patient has history of hypertension and hypercholesteremia. According to patient she has noted some mild decreased energy and mild memory problems for past few weeks. She is somewhat dismissive of these issues does not think it is that much of a problem. She tells me that she is able to shop, drive, take care of her own personal needs without any problem. According to patient's husband and also patient's daughter (via notes and report through her dad) patient has had 6-12 months of gradual onset progressive short-term memory loss, low energy, poor eating habits, repeating herself, confusion, disinterest in activities. Patient's husband saw similar problems in his own mother who was diagnosed with advanced dementia late in life. Patient is concerned about similar diagnosis for his wife. No specific triggering or aggravating factors. Patient's daughter has taken over management of her medications. Patient still drives short distances on her  own. Longer distance travel is managed by patient's husband.    REVIEW OF SYSTEMS: Full 14 system review of systems performed and notable only for those listed, all others are neg:  Constitutional: neg  Cardiovascular: neg Ear/Nose/Throat: neg  Skin: neg Eyes: neg Respiratory: neg Gastroitestinal: neg  Hematology/Lymphatic: neg  Endocrine: neg Musculoskeletal:neg Allergy/Immunology: neg Neurological: Memory loss Psychiatric: Decreased concentration Sleep : neg   ALLERGIES: Allergies  Allergen Reactions  . Atorvastatin Other (See Comments)    Pt states she felt confused.  . Amlodipine Besylate Swelling  . Prednisone     No appetite    HOME MEDICATIONS: Outpatient Medications Prior to Visit  Medication Sig Dispense Refill  . amLODipine (NORVASC) 5 MG tablet TAKE 1 TABLET BY MOUTH EVERY DAY 30 tablet 5  . ezetimibe (ZETIA) 10 MG tablet Take 1 tablet (10 mg total) by mouth daily. 90 tablet 3  . levothyroxine (SYNTHROID, LEVOTHROID) 75 MCG tablet TAKE 1 TABLET (75 MCG TOTAL) BY MOUTH DAILY BEFORE BREAKFAST. 30 tablet 6  . memantine (NAMENDA) 10 MG tablet Take 1 tablet (10 mg total) by mouth 2 (two) times daily. 60 tablet 12  . metoprolol tartrate (LOPRESSOR) 100 MG tablet TAKE 1 TABLET (100 MG TOTAL) BY MOUTH 2 (TWO) TIMES DAILY. 180 tablet 3  . olmesartan (BENICAR) 40 MG tablet TAKE 1 TABLET (40 MG TOTAL) BY MOUTH DAILY. 90 tablet 1  . rosuvastatin (CRESTOR) 40 MG tablet Take 1 tablet (40 mg total) by mouth daily. 90 tablet 1  . ALPRAZolam (XANAX) 0.5 MG tablet Take 1 tablet (0.5 mg total) by mouth as needed for anxiety (for sedation before MRI scan; take 1 hour before  scan; may repeat 15 min before scan). 3 tablet 0   No facility-administered medications prior to visit.     PAST MEDICAL HISTORY: Past Medical History:  Diagnosis Date  . GERD (gastroesophageal reflux disease)   . Hx of Doppler ultrasound 12/09/2010   Renal duplexsuggested distal abdominal aorta  segment in the origin of the right and left commom iliac arteries with elevated velocities consistent with at least 50% diameter reduction, 60% narrowing in the right renal artery with stable PSV at 210.  Marland Kitchen Hx of echocardiogram 12/09/2010   EF 55% showed mild concrentric LVH with normal systolic function and grade 1 diastolic dysfunction. she had mild to moderate LA dilation, mild to moderate mitral annular calcification with moderate MR, and she had trival tricupid regurgitation.  . Hyperlipidemia   . Hypertension   . Hypothyroidism     PAST SURGICAL HISTORY: Past Surgical History:  Procedure Laterality Date  . no prior surgery    . WISDOM TOOTH EXTRACTION  1980    FAMILY HISTORY: Family History  Problem Relation Age of Onset  . Hypertension Mother   . Stroke Mother   . Hypertension Father   . Stroke Father   . Colon cancer Neg Hx   . Stomach cancer Neg Hx     SOCIAL HISTORY: Social History   Socioeconomic History  . Marital status: Married    Spouse name: Alvester Chou  . Number of children: 2  . Years of education: 32  . Highest education level: Not on file  Social Needs  . Financial resource strain: Not on file  . Food insecurity - worry: Not on file  . Food insecurity - inability: Not on file  . Transportation needs - medical: Not on file  . Transportation needs - non-medical: Not on file  Occupational History    Comment: retired  Tobacco Use  . Smoking status: Never Smoker  . Smokeless tobacco: Never Used  Substance and Sexual Activity  . Alcohol use: No  . Drug use: No  . Sexual activity: Yes    Birth control/protection: None  Other Topics Concern  . Not on file  Social History Narrative   Lives with husband   Caffeine- none   Education 14     PHYSICAL EXAM  Vitals:   11/02/17 1333  BP: (!) 158/75  Pulse: (!) 54  Weight: 115 lb 12.8 oz (52.5 kg)   Body mass index is 19.27 kg/m.  Generalized: Well developed, in no acute distress  Head:  normocephalic and atraumatic,. Oropharynx benign  Neck: Supple, Musculoskeletal: No deformity   Neurological examination   Mentation: Alert. AFT5 Clock drawing 2/4 MMSE - Mini Mental State Exam 11/02/2017 07/01/2017 03/06/2017  Orientation to time 1 3 4   Orientation to Place 3 4 3   Registration 3 3 3   Attention/ Calculation 1 0 3  Recall 1 1 1   Language- name 2 objects 2 2 2   Language- repeat 1 1 1   Language- follow 3 step command 3 3 3   Language- read & follow direction 1 1 1   Write a sentence 1 1 1   Copy design 0 0 1  Total score 17 19 23     Follows all commands speech and language fluent.   Cranial nerve II-XII: Pupils were equal round reactive to light extraocular movements were full, visual field were full on confrontational test. Facial sensation and strength were normal. hearing was intact to finger rubbing bilaterally. Uvula tongue midline. head turning and shoulder shrug were normal and symmetric.Tongue  protrusion into cheek strength was normal. Motor: normal bulk and tone, full strength in the BUE, BLE,  Sensory: normal and symmetric to light touch, Coordination: finger-nose-finger, heel-to-shin bilaterally, no dysmetria Reflexes: Symmetric upper and lower, plantar responses were flexor bilaterally. Gait and Station: Rising up from seated position without assistance, narrow-based stance able  to perform tiptoe, and heel walking without difficulty. Tandem gait is unsteady  DIAGNOSTIC DATA (LABS, IMAGING, TESTING) - I reviewed patient records, labs, notes, testing and imaging myself where available.  Lab Results  Component Value Date   WBC CANCELED 10/20/2017   HGB CANCELED 10/20/2017   HCT CANCELED 10/20/2017   MCV 90.7 01/30/2017   PLT CANCELED 10/20/2017      Component Value Date/Time   NA 148 (H) 10/20/2017 0911   K 2.5 (L) 10/20/2017 0911   CL 99 10/20/2017 0911   CO2 24 10/20/2017 0911   GLUCOSE 94 10/20/2017 0911   GLUCOSE 117 (H) 01/30/2017 1137   BUN 19  10/20/2017 0911   CREATININE 1.33 (H) 10/20/2017 0911   CREATININE 0.91 01/30/2017 1137   CALCIUM 10.2 10/20/2017 0911   PROT 7.3 10/20/2017 0911   ALBUMIN 4.5 10/20/2017 0911   AST 38 10/20/2017 0911   ALT 26 10/20/2017 0911   ALKPHOS 76 10/20/2017 0911   BILITOT 0.5 10/20/2017 0911   GFRNONAA 39 (L) 10/20/2017 0911   GFRAA 45 (L) 10/20/2017 0911   Lab Results  Component Value Date   CHOL 165 10/20/2017   HDL 63 10/20/2017   LDLCALC 80 10/20/2017   TRIG 108 10/20/2017   CHOLHDL 2.6 10/20/2017   Lab Results  Component Value Date   HGBA1C 6.1 (H) 07/27/2013   Lab Results  Component Value Date   VITAMINB12 1,329 (H) 03/06/2017   Lab Results  Component Value Date   TSH 4.330 10/20/2017      ASSESSMENT AND PLAN 77 y.o. year old female here with gradual onset progressive short-term memory problems, confusion, repeating herself, decreased energy, with MMSE of 23/30 --> 19/30,>17/30. poor insight. Findings are consistent with  dementia.       PLAN: Continue  memantine 10mg  twice a day  Add Aricept 5mg  every am with breakfast Continue medication oversight by daughter  Recommend walking or exercise for overall health and well-being Strategies games, crossword puzzles Reviewed and home safety measures Follow-up in 6 months May consider SSRI in future Dennie Bible, [facility], [facility], APRN  [facility] [address] [place], [place] [address] [phone]

## 2017-11-02 ENCOUNTER — Ambulatory Visit (INDEPENDENT_AMBULATORY_CARE_PROVIDER_SITE_OTHER): Payer: PPO | Admitting: Nurse Practitioner

## 2017-11-02 ENCOUNTER — Encounter (INDEPENDENT_AMBULATORY_CARE_PROVIDER_SITE_OTHER): Payer: Self-pay

## 2017-11-02 ENCOUNTER — Encounter: Payer: Self-pay | Admitting: Nurse Practitioner

## 2017-11-02 DIAGNOSIS — R413 Other amnesia: Secondary | ICD-10-CM | POA: Diagnosis not present

## 2017-11-02 MED ORDER — DONEPEZIL HCL 5 MG PO TABS: 5.0000 mg | ORAL_TABLET | Freq: Every day | ORAL | 6 refills | Status: DC

## 2017-11-02 NOTE — Patient Instructions (Signed)
Continue  memantine 10mg  twice a day  Add Aricept 5mg  every am with breakfast Continue medication oversight Recommend overall health and well-being Strategies games, crossword puzzles Follow-up in 6 months

## 2017-11-17 NOTE — Progress Notes (Signed)
I reviewed note and agree with plan.   Wendelyn Kiesling R. Shelina Luo, MD 11/17/2017, 9:14 AM Certified in Neurology, Neurophysiology and Neuroimaging  [facility] [address], [address] [place], [place] [address] [phone]  

## 2017-11-19 ENCOUNTER — Encounter: Payer: Self-pay | Admitting: *Deleted

## 2017-12-15 ENCOUNTER — Other Ambulatory Visit: Payer: Self-pay | Admitting: Cardiovascular Disease

## 2017-12-18 ENCOUNTER — Other Ambulatory Visit: Payer: Self-pay | Admitting: Cardiovascular Disease

## 2017-12-18 NOTE — Telephone Encounter (Signed)
REFILL 

## 2018-01-15 ENCOUNTER — Other Ambulatory Visit: Payer: Self-pay | Admitting: Cardiovascular Disease

## 2018-02-13 ENCOUNTER — Other Ambulatory Visit: Payer: Self-pay | Admitting: Diagnostic Neuroimaging

## 2018-02-14 ENCOUNTER — Other Ambulatory Visit: Payer: Self-pay | Admitting: Cardiovascular Disease

## 2018-03-20 ENCOUNTER — Other Ambulatory Visit: Payer: Self-pay | Admitting: Cardiovascular Disease

## 2018-03-26 ENCOUNTER — Other Ambulatory Visit: Payer: Self-pay | Admitting: Cardiovascular Disease

## 2018-04-13 ENCOUNTER — Other Ambulatory Visit: Payer: Self-pay | Admitting: Cardiovascular Disease

## 2018-04-13 NOTE — Telephone Encounter (Signed)
Rx sent to pharmacy   

## 2018-04-20 ENCOUNTER — Other Ambulatory Visit: Payer: Self-pay | Admitting: Nurse Practitioner

## 2018-04-22 ENCOUNTER — Other Ambulatory Visit: Payer: Self-pay | Admitting: Cardiovascular Disease

## 2018-04-22 NOTE — Telephone Encounter (Signed)
Rx sent to pharmacy   

## 2018-04-24 ENCOUNTER — Other Ambulatory Visit: Payer: Self-pay | Admitting: Cardiovascular Disease

## 2018-04-26 ENCOUNTER — Ambulatory Visit (INDEPENDENT_AMBULATORY_CARE_PROVIDER_SITE_OTHER): Payer: PPO | Admitting: Cardiovascular Disease

## 2018-04-26 ENCOUNTER — Encounter: Payer: Self-pay | Admitting: Cardiovascular Disease

## 2018-04-26 VITALS — BP 174/76 | HR 50 | Ht 65.0 in | Wt 98.6 lb

## 2018-04-26 DIAGNOSIS — R9431 Abnormal electrocardiogram [ECG] [EKG]: Secondary | ICD-10-CM | POA: Diagnosis not present

## 2018-04-26 DIAGNOSIS — R634 Abnormal weight loss: Secondary | ICD-10-CM

## 2018-04-26 DIAGNOSIS — R0602 Shortness of breath: Secondary | ICD-10-CM | POA: Diagnosis not present

## 2018-04-26 DIAGNOSIS — E785 Hyperlipidemia, unspecified: Secondary | ICD-10-CM

## 2018-04-26 DIAGNOSIS — E039 Hypothyroidism, unspecified: Secondary | ICD-10-CM | POA: Diagnosis not present

## 2018-04-26 DIAGNOSIS — R630 Anorexia: Secondary | ICD-10-CM

## 2018-04-26 DIAGNOSIS — Z79899 Other long term (current) drug therapy: Secondary | ICD-10-CM

## 2018-04-26 DIAGNOSIS — I10 Essential (primary) hypertension: Secondary | ICD-10-CM

## 2018-04-26 MED ORDER — METOPROLOL TARTRATE 50 MG PO TABS: 50.0000 mg | ORAL_TABLET | Freq: Two times a day (BID) | ORAL | 3 refills | Status: DC

## 2018-04-26 NOTE — Progress Notes (Signed)
Patient ID: Lindsey Goodman, female   DOB: September 04, 1941, 77 y.o.   MRN: [id number]    HPI: [name] is a 77 y.o. female presents for an 8 month followup cardiology evaluation.  Lindsey Goodman has a history of hypertension, hypothyroidism, hyperlipidemia, as well as GERD. Remotely, she developed a cough secondary to ACE inhibition and also developed hyponatremia secondary to hydrochlorothiazide. In March 2013, a renal duplex study demonstrated a greater than 60% diameter reduction in the distal aorta at the origin of the right and left common iliac arteries, and had equal to or greater than 60% diameter reduction in the right renal artery. Her left renal artery have normal patency. Her kidney sizes were normal.  When I last saw last year in 2015 , she was inadvertently taking both Benicar and irbesartan after her insurance company had switched her to irbesartan and was also taking Lopressor 125 mg twice a day and Aldactone 25 mg daily.   She has been on combination therapy with Zetia 10 mg and atorvastatin 40 mg for her hyperlipidemia. She has taken Prilosec 20 mg for her GERD.  At that time, I recommended that she discontinue the irbesartan and resume the Benicar which she had tolerated well and had gotten insurance approval. In December 2015 cholesterol was 141, LDL 81, HDL 46, TG 71.  CBC was stable.  Potassium was normal at 4.6.  Thyroid function studies were normal on her current dose of levothyroxine 100 g daily.  She underwent a follow-up renal duplex study on 01/08/2015.  This showed moderate amount of atherosclerotic plaque at the distal segment of her abdominal aorta consistent with greater than 50% diameter reduction by velocity.  The right renal artery demonstrated elevated velocities consistent with less than 60% diameter reduction.  She had normal left renal artery.    In 2016 follow-up laboratory revealed a total cholesterol 262, LDL 185, HDL 49 VLDL 28, triglycerides 138.  She had had some  issues with medication confusion but states she was taking the atorvastatin and Zetia.  Repeat blood work showed significant improvement in October 2016 with a total cholesterol 142, HDL 43, and LDL 76.  Apparently, since that time she was mentally confused and she read in the package insert that Lipitor can cause this, and as result, she has discontinued this therapy.   In June 2017 she underwent renal Doppler study which showed a normal caliber abdominal aorta, there was greater than 50% distal aorta stenosis.  Bilateral kidneys were decreased in size in the left kidney was 1.1 cm smaller than the right.  There was mild right renal artery narrowing of less than 59% with normal left renal artery patency.  Family members, had noticed issues with memory. She denied any episodes of chest pain or palpitations.  She had noticed some occasional swelling in her ankles.   She has not been taking statin therapy and has only been on Zetia 10 mg, levothyroxine 75 mg her hypothyroidism and for hypertension  metoprolol tartrate 125 mg twice a day, Benicar 40 mg daily, and spironolactone 25 mg twice a day.  When  I saw her, she had stage II hypertension and I added amlodipine 5 mg to medical regimen and also added furosemide for both blood pressure control and leg swelling.  I scheduled her for follow-up renal duplex scan and evaluation of abdominal aorta.  I recommended.  Due to concerns for possible dementia that she undergo a neurologic evaluation.  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  Her kidney size is stable compared to prior exam in the right kidney measured 0.6 cm larger than the left kidney.  There was a decrease in right renal artery velocities compared to prior exam and these were now the normal range.  Carotid duplex imaging showed heterogeneous plaque bilaterally with 1 at 39%  bilateral ICA stenoses.  Vertebrals showed normal antegrade flow.  She saw Dr. Leta Baptist for neurologic assessment who was concerned for probable mild dementia.  She is now on Namenda.  I last saw her in December 2018.  She has had issues with progressive weight loss.  She had undergone laboratory in January and was found to be hypokalemic with potassium of 2.5, multiple calls were made contact the patient all unsuccessful.  The patient has had continued difficulty with memory issues.  Since June 2018 her weight has reduced from 126 pounds to 98 pounds.  Her last colonoscopy has been over 5 years ago.  She denies any chest pain.  Her appetite is poor.  She admits to early satiety.  She denies chest pain or palpitations.  She presents for evaluation.  Past Medical History:  Diagnosis Date  . GERD (gastroesophageal reflux disease)   . Hx of Doppler ultrasound 12/09/2010   Renal duplexsuggested distal abdominal aorta segment in the origin of the right and left commom iliac arteries with elevated velocities consistent with at least 50% diameter reduction, 60% narrowing in the right renal artery with stable PSV at 210.  Marland Kitchen Hx of echocardiogram 12/09/2010   EF 55% showed mild concrentric LVH with normal systolic function and grade 1 diastolic dysfunction. she had mild to moderate LA dilation, mild to moderate mitral annular calcification with moderate MR, and she had trival tricupid regurgitation.  . Hyperlipidemia   . Hypertension   . Hypothyroidism     Past Surgical History:  Procedure Laterality Date  . no prior surgery    . WISDOM TOOTH EXTRACTION  1980    Allergies  Allergen Reactions  . Atorvastatin Other (See Comments)    Pt states she felt confused.  . Amlodipine Besylate Swelling  . Prednisone     No appetite    Current Outpatient Medications  Medication Sig Dispense Refill  . amLODipine (NORVASC) 5 MG tablet Take 1 tablet (5 mg total) by mouth daily. Keep OV. 30 tablet 0  .  donepezil (ARICEPT) 5 MG tablet TAKE 1 TABLET BY MOUTH EVERY DAY 90 tablet 2  . ezetimibe (ZETIA) 10 MG tablet Take 1 tablet (10 mg total) by mouth daily. 90 tablet 3  . levothyroxine (SYNTHROID, LEVOTHROID) 75 MCG tablet TAKE 1 TABLET (75 MCG TOTAL) BY MOUTH DAILY BEFORE BREAKFAST. 30 tablet 3  . memantine (NAMENDA) 10 MG tablet TAKE 1 TABLET BY MOUTH TWICE A DAY 180 tablet 3  . metoprolol tartrate (LOPRESSOR) 50 MG tablet Take 1 tablet (50 mg total) by mouth 2 (two) times daily. 180 tablet 3  . olmesartan (BENICAR) 40 MG tablet TAKE 1 TABLET BY MOUTH EVERY DAY 90 tablet 0  . rosuvastatin (CRESTOR) 40 MG tablet Take 1 tablet (40 mg total) by mouth daily. Keep OV 90 tablet 0   No current facility-administered medications for this visit.     Social History   Socioeconomic History  . Marital status: Married    Spouse name: Alvester Chou  . Number of children: 2  .  Years of education: 48  . Highest education level: Not on file  Occupational History    Comment: retired  Scientific laboratory technician  . Financial resource strain: Not on file  . Food insecurity:    Worry: Not on file    Inability: Not on file  . Transportation needs:    Medical: Not on file    Non-medical: Not on file  Tobacco Use  . Smoking status: Never Smoker  . Smokeless tobacco: Never Used  Substance and Sexual Activity  . Alcohol use: No  . Drug use: No  . Sexual activity: Yes    Birth control/protection: None  Lifestyle  . Physical activity:    Days per week: Not on file    Minutes per session: Not on file  . Stress: Not on file  Relationships  . Social connections:    Talks on phone: Not on file    Gets together: Not on file    Attends religious service: Not on file    Active member of club or organization: Not on file    Attends meetings of clubs or organizations: Not on file    Relationship status: Not on file  . Intimate partner violence:    Fear of current or ex partner: Not on file    Emotionally abused: Not on file     Physically abused: Not on file    Forced sexual activity: Not on file  Other Topics Concern  . Not on file  Social History Narrative   Lives with husband   Caffeine- none   Education 7    Family History  Problem Relation Age of Onset  . Hypertension Mother   . Stroke Mother   . Hypertension Father   . Stroke Father   . Colon cancer Neg Hx   . Stomach cancer Neg Hx    Social history is notable that she is married has 2 children. He does travel with her husband's work as well as vacation. She does exercise. There is no tobacco or alcohol use.   ROS General: Negative; No fevers, chills, or night sweats;  HEENT: Negative; No changes in vision or hearing, sinus congestion, difficulty swallowing Pulmonary: Negative; No cough, wheezing, shortness of breath, hemoptysis Cardiovascular: Negative; No chest pain, presyncope, syncope, palpatations GI: Negative; No nausea, vomiting, diarrhea, or abdominal pain GU: Negative; No dysuria, hematuria, or difficulty voiding Musculoskeletal: Negative; no myalgias, joint pain, or weakness Hematologic/Oncology: Mild bruisability; no, bleeding Endocrine: Positive for hypothyroidism; no heat/cold intolerance; no diabetes Neuro: Possible decline in short-term memory. Skin: Occasional welts on her arms. Psychiatric: Flat affect Sleep: Positive for previous history of inadequate sleep duration; No snoring, daytime sleepiness, hypersomnolence, bruxism, restless legs, hypnogognic hallucinations, no cataplexy Other comprehensive 14 point system review is negative.  PE BP (!) 174/76   Pulse (!) 50   Ht '5\' 5"'  (1.651 m)   Wt 98 lb 9.6 oz (44.7 kg)   BMI 16.41 kg/m    Repeat blood pressure by me was 140/70 supine and 124/70 standing.  Wt Readings from Last 3 Encounters:  04/26/18 98 lb 9.6 oz (44.7 kg)  11/02/17 115 lb 12.8 oz (52.5 kg)  09/10/17 114 lb (51.7 kg)   General: Alert, oriented, no distress.  Very thin Skin: normal turgor, no  rashes, warm and dry HEENT: Normocephalic, atraumatic. Pupils equal round and reactive to light; sclera anicteric; extraocular muscles intact;  Nose without nasal septal hypertrophy Mouth/Parynx benign; Mallinpatti scale 2 Neck: No JVD, no carotid bruits; normal  carotid upstroke Lungs: clear to ausculatation and percussion; no wheezing or rales Chest wall: without tenderness to palpitation Heart: PMI not displaced, RRR, s1 s2 normal, 1/6 systolic murmur, no diastolic murmur, no rubs, gallops, thrills, or heaves Abdomen: scaphoid; nontender; no hepatosplenomehaly, BS+; abdominal aorta easily palpable Back: no CVA tenderness Pulses 2+ Musculoskeletal: full range of motion, normal strength, no joint deformities Extremities: no clubbing cyanosis or edema, Homan's sign negative  Neurologic: grossly nonfocal; Cranial nerves grossly wnl Psychologic: Normal mood and affect   ECG (independently read by me): Sinus bradycardia at 50 bpm.  Inferolateral ST changes.  Prolonged QTc interval at 528 msec  December 2018 ECG (independently read by me): Wandering baseline, but probably sinus rhythm at 56 bpm.  LVH by voltage criteria.  June 2018 ECG (independently read by me): Sinus bradycardia 51 bpm.  Nonspecific ST-T changes inferolaterally.  QTc interval 435 ms.  April 2018 ECG (independently read by me): Normal sinus rhythm at 62 bpm.  Left axis deviation.  Nonspecific ST changes in leads 1, aVL, and V6.  November 2017 ECG (independently read by me): Normal sinus rhythm at 63 bpm.  Nonspecific ST changes.  QTc interval 427 ms.  August 2016 ECG (independently read by me): Normal sinus rhythm at 62 bpm.  Normal intervals.  Nonspecific ST changes.  May 2016 ECG (independently read by me): Normal sinus rhythm at 61 bpm.  Nonspecific ST changes.  March 2016 ECG (independently read by me): Normal sinus rhythm at 60 bpm.  Nonspecific ST changes.  Normal intervals.  09/05/2014 ECG (independently read by  me) : Sinus bradycardia at 54 bpm.  Nonsignificant ST-T changes  May 2015 ECG  (independently read by me): Sinus bradycardia 57 beats per minute.  One isolated PVC.  Normal intervals.  Prior ECG: Sinus bradycardia 57 beats per minute. Normal intervals. No significant ST changes.  LABS:  BMP Latest Ref Rng & Units 10/20/2017 01/30/2017 08/11/2016  Glucose 65 - 99 mg/dL 94 117(H) 100(H)  BUN 8 - 27 mg/dL '19 11 13  ' Creatinine 0.57 - 1.00 mg/dL 1.33(H) 0.91 0.78  BUN/Creat Ratio 12 - 28 14 - -  Sodium 134 - 144 mmol/L 148(H) 140 138  Potassium 3.5 - 5.2 mmol/L 2.5(L) 4.4 4.9  Chloride 96 - 106 mmol/L 99 101 102  CO2 20 - 29 mmol/L '24 24 22  ' Calcium 8.7 - 10.3 mg/dL 10.2 10.2 10.7(H)    Hepatic Function Latest Ref Rng & Units 10/20/2017 01/30/2017 08/11/2016  Total Protein 6.0 - 8.5 g/dL 7.3 7.2 7.2  Albumin 3.5 - 4.8 g/dL 4.5 4.5 4.6  AST 0 - 40 IU/L 38 28 19  ALT 0 - 32 IU/L '26 18 11  ' Alk Phosphatase 39 - 117 IU/L 76 75 60  Total Bilirubin 0.0 - 1.2 mg/dL 0.5 0.7 0.8    CBC Latest Ref Rng & Units 10/20/2017 01/30/2017 08/11/2016  WBC x10E3/uL CANCELED 7.8 7.9  Hemoglobin - CANCELED 14.9 14.7  Hematocrit - CANCELED 45.9(H) 43.2  Platelets - CANCELED 337 320   Lab Results  Component Value Date   MCV 90.7 01/30/2017   MCV 90.9 08/11/2016   MCV 90.2 01/19/2015   Lab Results  Component Value Date   TSH 4.330 10/20/2017    BNP No results found for: PROBNP  Lipid Panel     Component Value Date/Time   CHOL 165 10/20/2017 0911   TRIG 108 10/20/2017 0911   HDL 63 10/20/2017 0911   CHOLHDL 2.6 10/20/2017 0911   CHOLHDL  4.7 01/30/2017 1137   VLDL 25 01/30/2017 1137   LDLCALC 80 10/20/2017 0911     RADIOLOGY: No results found.  IMPRESSION:  1. Essential hypertension   2. Prolonged Q-T interval on ECG   3. Shortness of breath   4. Abnormal weight loss   5. Loss of appetite   6. Hyperlipidemia with target LDL less than 70   7. Hypothyroidism, unspecified type   8.  Medication management     ASSESSMENT AND PLAN: Ms. 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  A renal duplex scan in March 2013 revealed her PSV was 261 with EDV 55 on the right. He was also found to have narrowing in the distal segment of her aorta.  A follow-up renal duplex study from June 2017 demonstrated PSV on the right  209 and on the left 186, which were slightly improved from previously.  She again was noted to have >50% distal aorta stenosis.  Right renal artery stenosis was in the range of 1-59%.  She was felt to have normal patency to right renal artery. Her left kidney measured 1.1 cm smaller than her right.  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  There was a decrease in right renal artery velocities compared to prior exam and now these are in the normal range.  In the past she has issues with stage II hypertension.  Most recently she has been on a regimen consisting of amlodipine 5 mg, metoprolol 100 mg twice a day, olmesartan 40 mg daily.  She has a 80m orthostatic drop in blood pressure today.  She is very fatigued.  Her ECG shows sinus bradycardia with prolonged QT interval.  I am reducing metoprolol to 50 mg twice a day.  In the past she was found to be significantly hypokalemic and multiple attempts were made to notify her for supplemental potassium and follow-up laboratory.  In the office today I strongly recommended follow-up laboratory be obtained.  And will check a C met, calcium, magnesium, phosphorus, CBC with differential, erythrocyte sedimentation rate, TSH and lipid studies.  I am concerned with her 28 pound weight loss over the past 13 months.  I have recommended a CT of her chest and abdomen.  I also have recommended a GI evaluation  particularly since her colonoscopy was over 5 years ago.  Her poor memory has progressed.  Discussions are underway with advanced directives and her husband  brought some paper with them today concerning possibility of DNR in hospital and out of hospital.  I will see her in 3 months for follow-up evaluation.   TTroy Sine MD, [facility] 04/28/2018 6:40 PM

## 2018-04-26 NOTE — Patient Instructions (Addendum)
Medication Instructions:  Decrease metoprolol tartrate to 50 mg two times daily  Labwork: Today (CMET, CBC w/ diff/platelet, Lipid, TSH, Phosphorus, Mag)  Testing/Procedures: CT of the chest and CT of the abdomen: Non-Cardiac CT scanning, (CAT scanning), is a noninvasive, special x-ray that produces cross-sectional images of the body using x-rays and a computer. CT scans help physicians diagnose and treat medical conditions. For some CT exams, a contrast material is used to enhance visibility in the area of the body being studied. CT scans provide greater clarity and reveal more details than regular x-ray exams.  Follow-Up: 3 months with Dr. Dow Adolph have been referred to: Gastroenterology  Any Other Special Instructions Will Be Listed Below (If Applicable).     If you need a refill on your cardiac medications before your next appointment, please call your pharmacy.

## 2018-04-27 ENCOUNTER — Telehealth: Payer: Self-pay | Admitting: *Deleted

## 2018-04-27 NOTE — Telephone Encounter (Signed)
Patient had OV 7/22, blood work ordered.   Lab tech unable to get sample after 3 attempts.       Attempt to call patient to inform to get blood work drawn today (ASAP).   No VM set up on cell and home # is busy.

## 2018-04-27 NOTE — Telephone Encounter (Signed)
Attempt to call home-  No answer and unable to leave VM

## 2018-04-27 NOTE — Telephone Encounter (Signed)
atttempt to call home and cell, no answer and unable to leave VM

## 2018-04-28 ENCOUNTER — Encounter: Payer: Self-pay | Admitting: Cardiovascular Disease

## 2018-04-28 DIAGNOSIS — R634 Abnormal weight loss: Secondary | ICD-10-CM | POA: Diagnosis not present

## 2018-04-28 DIAGNOSIS — Z79899 Other long term (current) drug therapy: Secondary | ICD-10-CM | POA: Diagnosis not present

## 2018-04-28 DIAGNOSIS — R9431 Abnormal electrocardiogram [ECG] [EKG]: Secondary | ICD-10-CM | POA: Diagnosis not present

## 2018-04-28 DIAGNOSIS — R0602 Shortness of breath: Secondary | ICD-10-CM | POA: Diagnosis not present

## 2018-04-29 ENCOUNTER — Inpatient Hospital Stay (HOSPITAL_COMMUNITY)
Admission: EM | Admit: 2018-04-29 | Discharge: 2018-05-04 | DRG: 683 | Disposition: A | Discharge status: Home Health | Payer: PPO | Source: Ambulatory Visit | Attending: Internal Medicine | Admitting: Internal Medicine

## 2018-04-29 ENCOUNTER — Other Ambulatory Visit: Payer: Self-pay | Admitting: Cardiology

## 2018-04-29 ENCOUNTER — Telehealth: Payer: Self-pay | Admitting: Cardiology

## 2018-04-29 ENCOUNTER — Telehealth: Payer: Self-pay | Admitting: *Deleted

## 2018-04-29 ENCOUNTER — Emergency Department (HOSPITAL_COMMUNITY): Payer: PPO

## 2018-04-29 ENCOUNTER — Other Ambulatory Visit: Payer: Self-pay

## 2018-04-29 ENCOUNTER — Encounter (HOSPITAL_COMMUNITY): Payer: Self-pay

## 2018-04-29 DIAGNOSIS — Z79890 Hormone replacement therapy (postmenopausal): Secondary | ICD-10-CM | POA: Diagnosis not present

## 2018-04-29 DIAGNOSIS — N183 Chronic kidney disease, stage 3 (moderate): Secondary | ICD-10-CM | POA: Diagnosis present

## 2018-04-29 DIAGNOSIS — E876 Hypokalemia: Secondary | ICD-10-CM | POA: Diagnosis not present

## 2018-04-29 DIAGNOSIS — I10 Essential (primary) hypertension: Secondary | ICD-10-CM | POA: Diagnosis present

## 2018-04-29 DIAGNOSIS — D696 Thrombocytopenia, unspecified: Secondary | ICD-10-CM | POA: Diagnosis not present

## 2018-04-29 DIAGNOSIS — I129 Hypertensive chronic kidney disease with stage 1 through stage 4 chronic kidney disease, or unspecified chronic kidney disease: Secondary | ICD-10-CM | POA: Diagnosis not present

## 2018-04-29 DIAGNOSIS — F0390 Unspecified dementia without behavioral disturbance: Secondary | ICD-10-CM | POA: Diagnosis not present

## 2018-04-29 DIAGNOSIS — Z823 Family history of stroke: Secondary | ICD-10-CM

## 2018-04-29 DIAGNOSIS — R7989 Other specified abnormal findings of blood chemistry: Secondary | ICD-10-CM | POA: Diagnosis present

## 2018-04-29 DIAGNOSIS — E873 Alkalosis: Secondary | ICD-10-CM | POA: Diagnosis present

## 2018-04-29 DIAGNOSIS — R945 Abnormal results of liver function studies: Secondary | ICD-10-CM | POA: Diagnosis present

## 2018-04-29 DIAGNOSIS — I340 Nonrheumatic mitral (valve) insufficiency: Secondary | ICD-10-CM | POA: Diagnosis present

## 2018-04-29 DIAGNOSIS — Z8249 Family history of ischemic heart disease and other diseases of the circulatory system: Secondary | ICD-10-CM

## 2018-04-29 DIAGNOSIS — Z888 Allergy status to other drugs, medicaments and biological substances status: Secondary | ICD-10-CM | POA: Diagnosis not present

## 2018-04-29 DIAGNOSIS — N289 Disorder of kidney and ureter, unspecified: Secondary | ICD-10-CM

## 2018-04-29 DIAGNOSIS — N189 Chronic kidney disease, unspecified: Secondary | ICD-10-CM | POA: Diagnosis present

## 2018-04-29 DIAGNOSIS — E039 Hypothyroidism, unspecified: Secondary | ICD-10-CM | POA: Diagnosis not present

## 2018-04-29 DIAGNOSIS — W19XXXA Unspecified fall, initial encounter: Secondary | ICD-10-CM | POA: Diagnosis present

## 2018-04-29 DIAGNOSIS — N179 Acute kidney failure, unspecified: Secondary | ICD-10-CM | POA: Diagnosis not present

## 2018-04-29 DIAGNOSIS — S0003XA Contusion of scalp, initial encounter: Secondary | ICD-10-CM | POA: Diagnosis not present

## 2018-04-29 DIAGNOSIS — E785 Hyperlipidemia, unspecified: Secondary | ICD-10-CM | POA: Diagnosis not present

## 2018-04-29 DIAGNOSIS — N281 Cyst of kidney, acquired: Secondary | ICD-10-CM | POA: Diagnosis not present

## 2018-04-29 DIAGNOSIS — K219 Gastro-esophageal reflux disease without esophagitis: Secondary | ICD-10-CM | POA: Diagnosis present

## 2018-04-29 LAB — BASIC METABOLIC PANEL
Anion gap: 11 (ref 5–15)
BUN: 13 mg/dL (ref 8–23)
CO2: 36 mmol/L — ABNORMAL HIGH (ref 22–32)
Calcium: 9.4 mg/dL (ref 8.9–10.3)
Chloride: 99 mmol/L (ref 98–111)
Creatinine, Ser: 1.55 mg/dL — ABNORMAL HIGH (ref 0.44–1.00)
GFR calc Af Amer: 36 mL/min — ABNORMAL LOW (ref >60)
GFR calc non Af Amer: 31 mL/min — ABNORMAL LOW (ref >60)
Glucose, Bld: 121 mg/dL — ABNORMAL HIGH (ref 70–99)
Potassium: 2.3 mmol/L — CL (ref 3.5–5.1)
Sodium: 146 mmol/L — ABNORMAL HIGH (ref 135–145)

## 2018-04-29 LAB — COMPREHENSIVE METABOLIC PANEL
ALT: 64 IU/L — ABNORMAL HIGH (ref 0–32)
ALT: 57 U/L — ABNORMAL HIGH (ref 0–44)
AST: 92 IU/L — ABNORMAL HIGH (ref 0–40)
AST: 75 U/L — ABNORMAL HIGH (ref 15–41)
Albumin/Globulin Ratio: 1.8 (ref 1.2–2.2)
Albumin: 4.8 g/dL (ref 3.5–4.8)
Albumin: 4.1 g/dL (ref 3.5–5.0)
Alkaline Phosphatase: 82 IU/L (ref 39–117)
Alkaline Phosphatase: 66 U/L (ref 38–126)
Anion gap: 17 — ABNORMAL HIGH (ref 5–15)
BUN/Creatinine Ratio: 7 — ABNORMAL LOW (ref 12–28)
BUN: 10 mg/dL (ref 8–27)
BUN: 15 mg/dL (ref 8–23)
Bilirubin Total: 0.7 mg/dL (ref 0.0–1.2)
CO2: 28 mmol/L (ref 20–29)
CO2: 33 mmol/L — ABNORMAL HIGH (ref 22–32)
Calcium: 10.5 mg/dL — ABNORMAL HIGH (ref 8.7–10.3)
Calcium: 9.7 mg/dL (ref 8.9–10.3)
Chloride: 91 mmol/L — ABNORMAL LOW (ref 96–106)
Chloride: 94 mmol/L — ABNORMAL LOW (ref 98–111)
Creatinine, Ser: 1.36 mg/dL — ABNORMAL HIGH (ref 0.57–1.00)
Creatinine, Ser: 1.69 mg/dL — ABNORMAL HIGH (ref 0.44–1.00)
GFR calc Af Amer: 44 mL/min/{1.73_m2} — ABNORMAL LOW (ref >59)
GFR calc Af Amer: 33 mL/min — ABNORMAL LOW (ref >60)
GFR calc non Af Amer: 38 mL/min/{1.73_m2} — ABNORMAL LOW (ref >59)
GFR calc non Af Amer: 28 mL/min — ABNORMAL LOW (ref >60)
Globulin, Total: 2.6 g/dL (ref 1.5–4.5)
Glucose, Bld: 111 mg/dL — ABNORMAL HIGH (ref 70–99)
Glucose: 134 mg/dL — ABNORMAL HIGH (ref 65–99)
Potassium: 1.9 mmol/L — CL (ref 3.5–5.2)
Potassium: 2.3 mmol/L — CL (ref 3.5–5.1)
Sodium: 146 mmol/L — ABNORMAL HIGH (ref 134–144)
Sodium: 144 mmol/L (ref 135–145)
Total Bilirubin: 1.2 mg/dL (ref 0.3–1.2)
Total Protein: 7.4 g/dL (ref 6.0–8.5)
Total Protein: 6.8 g/dL (ref 6.5–8.1)

## 2018-04-29 LAB — CBC WITH DIFFERENTIAL/PLATELET
Abs Immature Granulocytes: 0 10*3/uL (ref 0.0–0.1)
Basophils Absolute: 0.1 10*3/uL (ref 0.0–0.2)
Basophils Absolute: 0.1 10*3/uL (ref 0.0–0.1)
Basophils Relative: 1 %
Basos: 1 %
EOS (ABSOLUTE): 0.3 10*3/uL (ref 0.0–0.4)
Eos: 4 %
Eosinophils Absolute: 0.2 10*3/uL (ref 0.0–0.7)
Eosinophils Relative: 3 %
HCT: 41.4 % (ref 36.0–46.0)
Hematocrit: 44.5 % (ref 34.0–46.6)
Hemoglobin: 15 g/dL (ref 11.1–15.9)
Hemoglobin: 14.2 g/dL (ref 12.0–15.0)
Immature Grans (Abs): 0 10*3/uL (ref 0.0–0.1)
Immature Granulocytes: 0 %
Immature Granulocytes: 0 %
Lymphocytes Absolute: 1.5 10*3/uL (ref 0.7–3.1)
Lymphocytes Relative: 39 %
Lymphs Abs: 2.4 10*3/uL (ref 0.7–4.0)
Lymphs: 21 %
MCH: 29.8 pg (ref 26.6–33.0)
MCH: 30.5 pg (ref 26.0–34.0)
MCHC: 33.7 g/dL (ref 31.5–35.7)
MCHC: 34.3 g/dL (ref 30.0–36.0)
MCV: 89 fL (ref 79–97)
MCV: 88.8 fL (ref 78.0–100.0)
Monocytes Absolute: 0.4 10*3/uL (ref 0.1–0.9)
Monocytes Absolute: 0.6 10*3/uL (ref 0.1–1.0)
Monocytes Relative: 9 %
Monocytes: 6 %
Neutro Abs: 2.9 10*3/uL (ref 1.7–7.7)
Neutrophils Absolute: 4.8 10*3/uL (ref 1.4–7.0)
Neutrophils Relative %: 48 %
Neutrophils: 68 %
Platelets: 190 10*3/uL (ref 150–450)
Platelets: 116 10*3/uL — ABNORMAL LOW (ref 150–400)
RBC: 5.03 x10E6/uL (ref 3.77–5.28)
RBC: 4.66 MIL/uL (ref 3.87–5.11)
RDW: 13 % (ref 12.3–15.4)
RDW: 13.1 % (ref 11.5–15.5)
WBC: 7.1 10*3/uL (ref 3.4–10.8)
WBC: 6.1 10*3/uL (ref 4.0–10.5)

## 2018-04-29 LAB — LIPID PANEL
Chol/HDL Ratio: 2.6 ratio (ref 0.0–4.4)
Cholesterol, Total: 142 mg/dL (ref 100–199)
HDL: 55 mg/dL (ref >39)
LDL Calculated: 63 mg/dL (ref 0–99)
Triglycerides: 119 mg/dL (ref 0–149)
VLDL Cholesterol Cal: 24 mg/dL (ref 5–40)

## 2018-04-29 LAB — ACETAMINOPHEN LEVEL: Acetaminophen (Tylenol), Serum: <10 ug/mL — ABNORMAL LOW (ref 10–30)

## 2018-04-29 LAB — BRAIN NATRIURETIC PEPTIDE: B Natriuretic Peptide: 322.6 pg/mL — ABNORMAL HIGH (ref 0.0–100.0)

## 2018-04-29 LAB — MAGNESIUM
Magnesium: 2.4 mg/dL — ABNORMAL HIGH (ref 1.6–2.3)
Magnesium: 2.4 mg/dL (ref 1.7–2.4)

## 2018-04-29 LAB — PHOSPHORUS: Phosphorus: 2.2 mg/dL — ABNORMAL LOW (ref 2.5–4.5)

## 2018-04-29 LAB — TSH: TSH: 9.99 u[IU]/mL — ABNORMAL HIGH (ref 0.450–4.500)

## 2018-04-29 MED ORDER — POTASSIUM CHLORIDE CRYS ER 20 MEQ PO TBCR
40.0000 meq | EXTENDED_RELEASE_TABLET | Freq: Once | ORAL | Status: AC
  Administered 2018-04-29: 40 meq via ORAL
  Filled 2018-04-29: qty 2

## 2018-04-29 MED ORDER — POTASSIUM CHLORIDE 10 MEQ/100ML IV SOLN
10.0000 meq | INTRAVENOUS | Status: AC
  Administered 2018-04-29 (×2): 10 meq via INTRAVENOUS
  Filled 2018-04-29 (×2): qty 100

## 2018-04-29 MED ORDER — SENNOSIDES-DOCUSATE SODIUM 8.6-50 MG PO TABS: 1.0000 | ORAL_TABLET | Freq: Every evening | ORAL | Status: DC | PRN

## 2018-04-29 MED ORDER — ZOLPIDEM TARTRATE 5 MG PO TABS: 5.0000 mg | ORAL_TABLET | Freq: Every evening | ORAL | Status: DC | PRN

## 2018-04-29 MED ORDER — LACTATED RINGERS IV BOLUS
1000.0000 mL | Freq: Once | INTRAVENOUS | Status: AC
  Administered 2018-04-29: 1000 mL via INTRAVENOUS

## 2018-04-29 MED ORDER — AMLODIPINE BESYLATE 5 MG PO TABS: 5.0000 mg | ORAL_TABLET | Freq: Every day | ORAL | Status: DC

## 2018-04-29 MED ORDER — POTASSIUM CHLORIDE 10 MEQ/100ML IV SOLN
10.0000 meq | Freq: Once | INTRAVENOUS | Status: AC
  Administered 2018-04-29: 10 meq via INTRAVENOUS
  Filled 2018-04-29: qty 100

## 2018-04-29 MED ORDER — METOPROLOL TARTRATE 50 MG PO TABS
50.0000 mg | ORAL_TABLET | Freq: Two times a day (BID) | ORAL | Status: DC
  Administered 2018-04-30 – 2018-05-04 (×9): 50 mg via ORAL
  Filled 2018-04-29 (×10): qty 1

## 2018-04-29 MED ORDER — MAGNESIUM SULFATE IN D5W 1-5 GM/100ML-% IV SOLN
1.0000 g | Freq: Once | INTRAVENOUS | Status: AC
  Administered 2018-04-30: 1 g via INTRAVENOUS
  Filled 2018-04-29 (×2): qty 100

## 2018-04-29 MED ORDER — EZETIMIBE 10 MG PO TABS
10.0000 mg | ORAL_TABLET | Freq: Every day | ORAL | Status: DC
  Administered 2018-04-30 – 2018-05-04 (×5): 10 mg via ORAL
  Filled 2018-04-29 (×5): qty 1

## 2018-04-29 MED ORDER — HYDRALAZINE HCL 20 MG/ML IJ SOLN: 5.0000 mg | INTRAMUSCULAR | Status: DC | PRN

## 2018-04-29 MED ORDER — SODIUM CHLORIDE 0.9 % IV SOLN
INTRAVENOUS | Status: DC
  Administered 2018-04-29: 23:00:00 via INTRAVENOUS

## 2018-04-29 MED ORDER — DONEPEZIL HCL 5 MG PO TABS
5.0000 mg | ORAL_TABLET | Freq: Every day | ORAL | Status: DC
  Administered 2018-04-30 – 2018-05-04 (×5): 5 mg via ORAL
  Filled 2018-04-29 (×5): qty 1

## 2018-04-29 MED ORDER — POTASSIUM CHLORIDE 10 MEQ/100ML IV SOLN
10.0000 meq | INTRAVENOUS | Status: AC
  Administered 2018-04-29: 10 meq via INTRAVENOUS
  Filled 2018-04-29: qty 100

## 2018-04-29 MED ORDER — MEMANTINE HCL 10 MG PO TABS
10.0000 mg | ORAL_TABLET | Freq: Two times a day (BID) | ORAL | Status: DC
  Administered 2018-04-30 – 2018-05-04 (×10): 10 mg via ORAL
  Filled 2018-04-29 (×10): qty 1

## 2018-04-29 MED ORDER — ROSUVASTATIN CALCIUM 40 MG PO TABS
40.0000 mg | ORAL_TABLET | Freq: Every day | ORAL | Status: DC
  Administered 2018-04-30 – 2018-05-04 (×5): 40 mg via ORAL
  Filled 2018-04-29 (×5): qty 1

## 2018-04-29 MED ORDER — HYDROXYZINE HCL 10 MG PO TABS
10.0000 mg | ORAL_TABLET | Freq: Three times a day (TID) | ORAL | Status: DC | PRN
Start: 2018-04-29 — End: 2018-05-04

## 2018-04-29 MED ORDER — ONDANSETRON HCL 4 MG/2ML IJ SOLN
INTRAMUSCULAR | Status: AC
  Administered 2018-04-29: 4 mg
  Filled 2018-04-29: qty 2

## 2018-04-29 MED ORDER — ONDANSETRON HCL 4 MG/2ML IJ SOLN: 4.0000 mg | Freq: Once | INTRAMUSCULAR | Status: DC

## 2018-04-29 MED ORDER — LEVOTHYROXINE SODIUM 100 MCG PO TABS
100.0000 ug | ORAL_TABLET | Freq: Every day | ORAL | Status: DC
  Administered 2018-04-30 – 2018-05-04 (×5): 100 ug via ORAL
  Filled 2018-04-29 (×5): qty 1

## 2018-04-29 NOTE — ED Notes (Signed)
Critical K+ of 2.3 resulted per lab at this time.  ED provider made aware

## 2018-04-29 NOTE — Telephone Encounter (Signed)
Received page from [facility].  The potassium was 1.9.  I attempted to contact the patient but was unable to reach her and left a voicemail message asking her to call the office first thing this morning.  I will pass on to the day team to ensure that this is followed up, and appropriate potassium supplementation and repeat labs are arranged.  Doylene Canning, MD

## 2018-04-29 NOTE — H&P (Signed)
History and Physical    Lindsey Goodman IRC:[id number] DOB: 10/28/1940 DOA: 04/29/2018  Referring MD/NP/PA:   PCP: Troy Sine, MD   Patient coming from:  The patient is coming from home.  At baseline, pt is partially dependent for most of ADL.   Chief Complaint: fall, abnormal lab with low potassium  HPI: Lindsey Goodman is a 77 y.o. female with medical history significant of dementia, hypertension, hyperlipidemia, GERD, hypothyroidism CKD-3, who presents with fall, abnormal lab with low potassium.  Per report, patient fell 3 times in the past 3 days, one of which she hit the left side of her head. Patient denies dizziness or lightheadedness.  No unilateral weakness, numbness or tingling his extremities.  No vision change or hearing loss. She is not sure if she had LOC or not. PCP did lab test and found her potassium was1.9, therefore sent to ED for further evaluation and treatment.  Patient denies any chest pain, shortness breath, cough.  No fever or chills.  No nausea vomiting, diarrhea, abdominal pain, symptoms of UTI.  ED Course: pt was found to have potassium 2.3, WBC 6.1, mild thrombocytopenia with platelets of 116, worsening renal function, abnormal liver function with AST 75, ALT 57, total bilirubin 1.2, ALP 66, temperature normal, bradycardia, oxygen saturation 100% on room air.  CT head is negative for acute intracranial abnormalities, but showed superficial soft tissue injury. Pt is placed on tele bed for obs.  Review of Systems:   General: no fevers, chills, no body weight gain, has fatigue HEENT: no blurry vision, hearing changes or sore throat Respiratory: no dyspnea, coughing, wheezing CV: no chest pain, no palpitations GI: no nausea, vomiting, abdominal pain, diarrhea, constipation GU: no dysuria, burning on urination, increased urinary frequency, hematuria  Ext: no leg edema Neuro: no unilateral weakness, numbness, or tingling, no vision change or hearing loss. Had  fall. Skin: no rash, has small scalp lacerations MSK: No muscle spasm, no deformity, no limitation of range of movement in spin Heme: No easy bruising.  Travel history: No recent long distant travel.  Allergy:  Allergies  Allergen Reactions  . Atorvastatin Other (See Comments)    Pt states she felt confused.  . Amlodipine Besylate Swelling    Takes at home  . Prednisone     No appetite    Past Medical History:  Diagnosis Date  . GERD (gastroesophageal reflux disease)   . Hx of Doppler ultrasound 12/09/2010   Renal duplexsuggested distal abdominal aorta segment in the origin of the right and left commom iliac arteries with elevated velocities consistent with at least 50% diameter reduction, 60% narrowing in the right renal artery with stable PSV at 210.  Marland Kitchen Hx of echocardiogram 12/09/2010   EF 55% showed mild concrentric LVH with normal systolic function and grade 1 diastolic dysfunction. she had mild to moderate LA dilation, mild to moderate mitral annular calcification with moderate MR, and she had trival tricupid regurgitation.  . Hyperlipidemia   . Hypertension   . Hypothyroidism     Past Surgical History:  Procedure Laterality Date  . no prior surgery    . [place] EXTRACTION  1980    Social History:  reports that she has never smoked. She has never used smokeless tobacco. She reports that she does not drink alcohol or use drugs.  Family History:  Family History  Problem Relation Age of Onset  . Hypertension Mother   . Stroke Mother   . Hypertension Father   .  Stroke Father   . Colon cancer Neg Hx   . Stomach cancer Neg Hx      Prior to Admission medications   Medication Sig Start Date End Date Taking? Authorizing Provider  amLODipine (NORVASC) 5 MG tablet Take 1 tablet (5 mg total) by mouth daily. Keep OV. 04/22/18  Yes Troy Sine, MD  donepezil (ARICEPT) 5 MG tablet TAKE 1 TABLET BY MOUTH EVERY DAY 04/20/18  Yes Dennie Bible, NP  ezetimibe  (ZETIA) 10 MG tablet Take 1 tablet (10 mg total) by mouth daily. 07/21/17  Yes Troy Sine, MD  levothyroxine (SYNTHROID, LEVOTHROID) 75 MCG tablet TAKE 1 TABLET (75 MCG TOTAL) BY MOUTH DAILY BEFORE BREAKFAST. 12/18/17  Yes Troy Sine, MD  memantine (NAMENDA) 10 MG tablet TAKE 1 TABLET BY MOUTH TWICE A DAY 02/15/18  Yes Penumalli, Earlean Polka, MD  metoprolol tartrate (LOPRESSOR) 50 MG tablet Take 1 tablet (50 mg total) by mouth 2 (two) times daily. 04/26/18  Yes Troy Sine, MD  olmesartan (BENICAR) 40 MG tablet TAKE 1 TABLET BY MOUTH EVERY DAY 03/22/18  Yes Troy Sine, MD  rosuvastatin (CRESTOR) 40 MG tablet Take 1 tablet (40 mg total) by mouth daily. Keep OV 04/13/18  Yes Troy Sine, MD    Physical Exam: Vitals:   04/29/18 2301 04/29/18 2310 04/30/18 0325 04/30/18 0511  BP: (!) 146/114 (!) 176/66 (!) 127/53 (!) 146/60  Pulse: 61 67 (!) 54 (!) 54  Resp: 18  16 18   Temp: 98 F (36.7 C)  98.2 F (36.8 C) 98 F (36.7 C)  TempSrc: Oral  Oral Oral  SpO2: 100%  100% 98%  Weight:   46.1 kg (101 lb 11.2 oz)   Height:       General: Not in acute distress HEENT:       Eyes: PERRL, EOMI, no scleral icterus.       ENT: No discharge from the ears and nose, no pharynx injection, no tonsillar enlargement.        Neck: No JVD, no bruit, no mass felt. Heme: No neck lymph node enlargement. Cardiac: S1/S2, RRR, No murmurs, No gallops or rubs. Respiratory:  No rales, wheezing, rhonchi or rubs. GI: Soft, nondistended, nontender, no rebound pain, no organomegaly, BS present. GU: No hematuria Ext: No pitting leg edema bilaterally. 2+DP/PT pulse bilaterally. Musculoskeletal: No joint deformities, No joint redness or warmth, no limitation of ROM in spin. Skin: No rashes. has small scalp lacerations Neuro: Alert, oriented to place and person, but not to time, cranial nerves II-XII grossly intact, moves all extremities normally.  Psych: Patient is not psychotic, no suicidal or hemocidal  ideation.  Labs on Admission: I have personally reviewed following labs and imaging studies  CBC: Recent Labs  Lab 04/28/18 1434 04/29/18 1227  WBC 7.1 6.1  NEUTROABS 4.8 2.9  HGB 15.0 14.2  HCT 44.5 41.4  MCV 89 88.8  PLT 190 341*   Basic Metabolic Panel: Recent Labs  Lab 04/28/18 1434 04/29/18 1227 04/29/18 1938  NA 146* 144 146*  K 1.9* 2.3* 2.3*  CL 91* 94* 99  CO2 28 33* 36*  GLUCOSE 134* 111* 121*  BUN 10 15 13   CREATININE 1.36* 1.69* 1.55*  CALCIUM 10.5* 9.7 9.4  MG 2.4* 2.4  --   PHOS 2.2*  --   --    GFR: Estimated Creatinine Clearance: 22.5 mL/min (A) (by C-G formula based on SCr of 1.55 mg/dL (H)). Liver Function Tests: Recent Labs  Lab 04/28/18 1434 04/29/18 1227  AST 92* 75*  ALT 64* 57*  ALKPHOS 82 66  BILITOT 0.7 1.2  PROT 7.4 6.8  ALBUMIN 4.8 4.1   No results for input(s): LIPASE, AMYLASE in the last 168 hours. No results for input(s): AMMONIA in the last 168 hours. Coagulation Profile: No results for input(s): INR, PROTIME in the last 168 hours. Cardiac Enzymes: No results for input(s): CKTOTAL, CKMB, CKMBINDEX, TROPONINI in the last 168 hours. BNP (last 3 results) No results for input(s): PROBNP in the last 8760 hours. HbA1C: No results for input(s): HGBA1C in the last 72 hours. CBG: No results for input(s): GLUCAP in the last 168 hours. Lipid Profile: Recent Labs    04/28/18 1434  CHOL 142  HDL 55  LDLCALC 63  TRIG 119  CHOLHDL 2.6   Thyroid Function Tests: Recent Labs    04/28/18 1434  TSH 9.990*   Anemia Panel: No results for input(s): VITAMINB12, FOLATE, FERRITIN, TIBC, IRON, RETICCTPCT in the last 72 hours. Urine analysis: No results found for: COLORURINE, APPEARANCEUR, LABSPEC, PHURINE, GLUCOSEU, HGBUR, BILIRUBINUR, KETONESUR, PROTEINUR, UROBILINOGEN, NITRITE, LEUKOCYTESUR Sepsis Labs: @LABRCNTIP (procalcitonin:4,lacticidven:4) )No results found for this or any previous visit (from the past 240 hour(s)).    Radiological Exams on Admission: Ct Head Wo Contrast  Result Date: 04/29/2018 CLINICAL DATA:  77 year old female with multiple recent falls on Eliquis. Struck the side of her head. Hypokalemia. EXAM: CT HEAD WITHOUT CONTRAST TECHNIQUE: Contiguous axial images were obtained from the base of the skull through the vertex without intravenous contrast. COMPARISON:  Brain MRI 03/27/2017. FINDINGS: Brain: Patchy and confluent bilateral cerebral white matter hypodensity similar to the abnormal white matter FLAIR signal in 2018. Stable cerebral volume. No midline shift, ventriculomegaly, mass effect, evidence of mass lesion, intracranial hemorrhage or evidence of cortically based acute infarction. No cortical encephalomalacia identified. Vascular: Calcified atherosclerosis at the skull base. No suspicious intracranial vascular hyperdensity. Skull: No fracture or osseous abnormality identified. Sinuses/Orbits: Visualized paranasal sinuses and mastoids are stable and well pneumatized. Other: Posterior right convexity scalp soft tissue injury with swelling and subcutaneous gas (series 3, image 57). The underlying right calvarium appears intact. Contralateral left superior region soft tissue injury with trace gas (series 3, image 66). Underlying calvarium appears intact. Other scalp soft tissues appear normal. Visualized orbit soft tissues are within normal limits. Negative visible deep soft tissue spaces of the face. IMPRESSION: 1. Bilateral superficial soft tissue injuries with scalp lacerations and mild hematoma. No underlying skull fracture. 2. No acute intracranial abnormality. Chronic cerebral white matter disease appears stable since the MRI in 2018. Electronically Signed   By: Genevie Ann M.D.   On: 04/29/2018 13:20     EKG: Independently reviewed.  Sinus rhythm, QTC 515, U wave in V2, nonspecific T wave change   Assessment/Plan Principal Problem:   Acute renal failure superimposed on stage 3 chronic kidney  disease (HCC) Active Problems:   Essential hypertension   Hypothyroidism   Hyperlipidemia   Dementia   Hypokalemia   Fall   Abnormal LFTs   Acute renal failure superimposed on stage 3 chronic kidney disease ([place]): Baseline Cre is ~1.3 recently, pt's Cre is 1.69 and BUN 15  on admission. Likely due to dehydration and continuation of ARB  - please on tele bed for obs - IVF; 1L of ringer solution in ED, then 75 cc/h of NS - Follow up renal function by BMP - Hold Benicar  Essential hypertension: -Amlodipine, metoprolol -Hold Benicar due to worsening renal  function -IV hydralazine as needed  Hypothyroidism: Last TSH was 9.99 on 04/28/18, not controlled -increased dose of Synthroid from 75 to 100 mcg daily -Check TSH in 6-8 weeks  HLD: -Hold Crestor due to abnormal liver function - Continue Zetia  Dementia: no behavioral disturbance -Namenda, donepezil  Hypokalemia: K= 2.3   on admission. - Repleted K - Check Mg level - Give 1 g of magnesium sulfate  Fall: Likely multifactorial etiology, including Dementia, electrolytes disturbance, worsening renal function.  CT head is negative for acute intracranial abnormalities -pt/ot -wound care consult for scalp laceration  Abnormal LFTs: - Check Tylenol level -Check hepatitis panel  - hold Crestor  DVT ppx: SCD Code Status: Full code Family Communication: None at bed side.   Disposition Plan:  Anticipate discharge back to previous home environment Consults called:  none Admission status: Obs / tele    Date of Service 04/30/2018    Ivor Costa [facility] Pager [phone]  If 7PM-7AM, please contact night-coverage [email or web address] Password [facility] 04/30/2018, 6:09 AM

## 2018-04-29 NOTE — Progress Notes (Signed)
Pt is alert and oriented X2-3. Skin warm and dry. Call bell within reach.  Pt has been oriented to room. Denies any discomfort or pain. Denies any incontinent episodes and memory issues but she is on Aricept and namenda. No distress noted at this time. Will continue to monitor

## 2018-04-29 NOTE — ED Provider Notes (Signed)
[facility] EMERGENCY DEPARTMENT Provider Note  CSN: [id number] Arrival date & time: 04/29/18  1154  History   Chief Complaint Chief Complaint  Patient presents with  . Abnormal Lab   HPI Lindsey Goodman is a 77 y.o. female with a medical history of GERD, HTN, hypothyroidism who presented to the ED via her PCP for hypokalemia. Her potassium was 1.9 when checked yesterday. Patient denies symptoms of hypokalemia including paresthesias, palpitations, weakness, N/V/D, abdominal pain. Patient denies any physical complaints today, but she is a poor historian and has documented concerns of cognitive impairment. She is accompanied by her husband who states that the patient has no had any physical changes recently.  Of significance as well, patient had a fall 3 days ago. She is on Eliquis and patient had bleeding from her head at the time of the accident. She is unable to state if she had LOC at that time. Husband did not witness the fall and could not provide collateral. Patient unable to recall if there were any preceding physical symptoms prior to fall.  Additional history obtained by medical chart. Patient was hypokalemic in 10/2017 with a potassium of 2.5, but the cardiologist could not get in contact with the patient for follow-up at that time.  Past Medical History:  Diagnosis Date  . GERD (gastroesophageal reflux disease)   . Hx of Doppler ultrasound 12/09/2010   Renal duplexsuggested distal abdominal aorta segment in the origin of the right and left commom iliac arteries with elevated velocities consistent with at least 50% diameter reduction, 60% narrowing in the right renal artery with stable PSV at 210.  Marland Kitchen Hx of echocardiogram 12/09/2010   EF 55% showed mild concrentric LVH with normal systolic function and grade 1 diastolic dysfunction. she had mild to moderate LA dilation, mild to moderate mitral annular calcification with moderate MR, and she had trival tricupid  regurgitation.  . Hyperlipidemia   . Hypertension   . Hypothyroidism     Patient Active Problem List   Diagnosis Date Noted  . Dementia 04/29/2018  . 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 Hypokalemia 04/29/2018  . Fall 04/29/2018  . Abnormal LFTs 04/29/2018  . Memory loss 11/02/2017  . Seasonal allergies 05/10/2015  . Sinus bradycardia 09/05/2014  . Fatigue 02/10/2014  . Essential hypertension 08/04/2013  . Renal artery stenosis ([place]) 08/04/2013  . Hypothyroidism 08/04/2013  . Hyperlipidemia 08/04/2013  . GERD (gastroesophageal reflux disease) 08/04/2013  . Lipoma of back 01/16/2012  . IRRITABLE BOWEL SYNDROME 04/10/2008    Past Surgical History:  Procedure Laterality Date  . no prior surgery    . WISDOM TOOTH EXTRACTION  1980     OB History   None      Home Medications    Prior to Admission medications   Medication Sig Start Date End Date Taking? Authorizing Provider  amLODipine (NORVASC) 5 MG tablet Take 1 tablet (5 mg total) by mouth daily. Keep OV. 04/22/18  Yes Troy Sine, MD  donepezil (ARICEPT) 5 MG tablet TAKE 1 TABLET BY MOUTH EVERY DAY 04/20/18  Yes Dennie Bible, NP  ezetimibe (ZETIA) 10 MG tablet Take 1 tablet (10 mg total) by mouth daily. 07/21/17  Yes Troy Sine, MD  levothyroxine (SYNTHROID, LEVOTHROID) 75 MCG tablet TAKE 1 TABLET (75 MCG TOTAL) BY MOUTH DAILY BEFORE BREAKFAST. 12/18/17  Yes Troy Sine, MD  memantine (NAMENDA) 10 MG tablet TAKE 1 TABLET BY MOUTH TWICE  A DAY 02/15/18  Yes Penumalli, Earlean Polka, MD  metoprolol tartrate (LOPRESSOR) 50 MG tablet Take 1 tablet (50 mg total) by mouth 2 (two) times daily. 04/26/18  Yes Troy Sine, MD  olmesartan (BENICAR) 40 MG tablet TAKE 1 TABLET BY MOUTH EVERY DAY 03/22/18  Yes Troy Sine, MD  rosuvastatin (CRESTOR) 40 MG tablet Take 1 tablet (40 mg total) by mouth daily. Keep OV 04/13/18  Yes Troy Sine, MD    Family  History Family History  Problem Relation Age of Onset  . Hypertension Mother   . Stroke Mother   . Hypertension Father   . Stroke Father   . Colon cancer Neg Hx   . Stomach cancer Neg Hx     Social History Social History   Tobacco Use  . Smoking status: Never Smoker  . Smokeless tobacco: Never Used  Substance Use Topics  . Alcohol use: No  . Drug use: No     Allergies   Atorvastatin; Amlodipine besylate; and Prednisone   Review of Systems Review of Systems  Constitutional: Positive for appetite change. Negative for chills, fatigue and fever.  HENT: Negative.   Eyes: Negative.   Respiratory: Negative for cough, chest tightness and shortness of breath.   Cardiovascular: Negative for chest pain and palpitations.  Gastrointestinal: Negative for abdominal pain, constipation, diarrhea, nausea and vomiting.  Genitourinary: Negative.   Musculoskeletal: Negative.   Skin: Negative.   Neurological: Negative for dizziness, weakness, light-headedness, numbness and headaches.  Hematological: Negative.      Physical Exam Updated Vital Signs BP (!) 141/53   Pulse 67   Temp 98 F (36.7 C) (Oral)   Resp (!) 24   Ht 5\' 5"  (1.651 m)   Wt 44.5 kg (98 lb)   SpO2 100%   BMI 16.31 kg/m   Physical Exam  Constitutional: Vital signs are normal.  Underweight. Chronic ill appearance.  HENT:  Head: Normocephalic.    Right Ear: Tympanic membrane, external ear and ear canal normal.  Left Ear: Tympanic membrane, external ear and ear canal normal.  Mouth/Throat: Uvula is midline, oropharynx is clear and moist and mucous membranes are normal.  Eyes: Pupils are equal, round, and reactive to light. Conjunctivae and EOM are normal.  Cardiovascular: Normal rate, regular rhythm, normal heart sounds and intact distal pulses.  Pulmonary/Chest: Effort normal and breath sounds normal.  Abdominal: Soft. Bowel sounds are normal. There is no tenderness.  Neurological: She is alert. She has  normal strength. No cranial nerve deficit or sensory deficit. She exhibits normal muscle tone.  Skin: Skin is warm. She is not diaphoretic. No pallor.  Psychiatric: She has a normal mood and affect. Her behavior is normal. She is not actively hallucinating. Cognition and memory are impaired. She exhibits abnormal recent memory. She is attentive.     ED Treatments / Results  Labs (all labs ordered are listed, but only abnormal results are displayed) Labs Reviewed  COMPREHENSIVE METABOLIC PANEL - Abnormal; Notable for the following components:      Result Value   Potassium 2.3 (*)    Chloride 94 (*)    CO2 33 (*)    Glucose, Bld 111 (*)    Creatinine, Ser 1.69 (*)    AST 75 (*)    ALT 57 (*)    GFR calc non Af Amer 28 (*)    GFR calc Af Amer 33 (*)    Anion gap 17 (*)    All other components within  normal limits  CBC WITH DIFFERENTIAL/PLATELET - Abnormal; Notable for the following components:   Platelets 116 (*)    All other components within normal limits  BASIC METABOLIC PANEL - Abnormal; Notable for the following components:   Sodium 146 (*)    Potassium 2.3 (*)    CO2 36 (*)    Glucose, Bld 121 (*)    Creatinine, Ser 1.55 (*)    GFR calc non Af Amer 31 (*)    GFR calc Af Amer 36 (*)    All other components within normal limits  MAGNESIUM    EKG EKG Interpretation  Date/Time:  Thursday April 29 2018 12:02:48 EDT Ventricular Rate:  62 PR Interval:  140 QRS Duration: 98 QT Interval:  508 QTC Calculation: 515 R Axis:   23 Text Interpretation:  Normal sinus rhythm Nonspecific ST and T wave abnormality Prolonged QT Abnormal ECG No old tracing to compare Findings concerning for hypokalemia with prolonged QT and U waves Confirmed by Duffy Bruce [phone]) on 04/29/2018 12:51:47 PM   Radiology Ct Head Wo Contrast  Result Date: 04/29/2018 CLINICAL DATA:  77 year old female with multiple recent falls on Eliquis. Struck the side of her head. Hypokalemia. EXAM: CT HEAD  WITHOUT CONTRAST TECHNIQUE: Contiguous axial images were obtained from the base of the skull through the vertex without intravenous contrast. COMPARISON:  Brain MRI 03/27/2017. FINDINGS: Brain: Patchy and confluent bilateral cerebral white matter hypodensity similar to the abnormal white matter FLAIR signal in 2018. Stable cerebral volume. No midline shift, ventriculomegaly, mass effect, evidence of mass lesion, intracranial hemorrhage or evidence of cortically based acute infarction. No cortical encephalomalacia identified. Vascular: Calcified atherosclerosis at the skull base. No suspicious intracranial vascular hyperdensity. Skull: No fracture or osseous abnormality identified. Sinuses/Orbits: Visualized paranasal sinuses and mastoids are stable and well pneumatized. Other: Posterior right convexity scalp soft tissue injury with swelling and subcutaneous gas (series 3, image 57). The underlying right calvarium appears intact. Contralateral left superior region soft tissue injury with trace gas (series 3, image 66). Underlying calvarium appears intact. Other scalp soft tissues appear normal. Visualized orbit soft tissues are within normal limits. Negative visible deep soft tissue spaces of the face. IMPRESSION: 1. Bilateral superficial soft tissue injuries with scalp lacerations and mild hematoma. No underlying skull fracture. 2. No acute intracranial abnormality. Chronic cerebral white matter disease appears stable since the MRI in 2018. Electronically Signed   By: Genevie Ann M.D.   On: 04/29/2018 13:20    Procedures .Critical Care Performed by: Romie Jumper, PA-C Authorized by: Romie Jumper, PA-C   Critical care provider statement:    Critical care time (minutes):  420   Critical care start time:  04/29/2018 1:45 PM   Critical care end time:  04/29/2018 8:50 PM   Critical care time was exclusive of:  Separately billable procedures and treating other patients   Critical care was necessary to  treat or prevent imminent or life-threatening deterioration of the following conditions:  Metabolic crisis   Critical care was time spent personally by me on the following activities:  Evaluation of patient's response to treatment, re-evaluation of patient's condition, ordering and review of laboratory studies, ordering and performing treatments and interventions, blood draw for specimens, development of treatment plan with patient or surrogate and discussions with consultants Comments:     Patient presented with potassium 2.3. She received 35mEq IV and 28mEq PO potassium in the ED, but her potassium did not change. There was prolonged QT interval and  U waves on EKG.   (including critical care time)  Medications Ordered in ED Medications  potassium chloride 10 mEq in 100 mL IVPB (10 mEq Intravenous Not Given 04/29/18 1852)  potassium chloride SA (K-DUR,KLOR-CON) CR tablet 40 mEq (40 mEq Oral Given 04/29/18 1456)  lactated ringers bolus 1,000 mL (0 mLs Intravenous Stopped 04/29/18 1726)  ondansetron (ZOFRAN) 4 MG/2ML injection (4 mg  Given 04/29/18 1458)  potassium chloride SA (K-DUR,KLOR-CON) CR tablet 40 mEq (40 mEq Oral Given 04/29/18 1842)  potassium chloride 10 mEq in 100 mL IVPB (0 mEq Intravenous Stopped 04/29/18 1952)     Initial Impression / Assessment and Plan / ED Course  Triage vital signs and the nursing notes have been reviewed.  Pertinent labs & imaging results that were available during care of the patient were reviewed and considered in medical decision making (see chart for details).  Patient presents for hypokalemia. Potassium was 1.9 at cardiologist on 04/27/18, but today it is 2.3. Patient and family are poor historians and are unable to give good information on s/s that patient has had that could point to an etiology for hypokalemia. EKG changes seen that are consistent with hypokalemia. Will replenish potassium in ED with PO and IV formulations and re-check labs and EKG to  determine if inpatient hospitalization is necessary.  Of equal importance, patient had a fall 3 days that resulted in head trauma with possible LOC. Patient is on anticoagulant and has a signifcant abrasion and bleeding at the time of the accident, but did not receive medical evaluation at that. Ordering head CT to properly evaluate for any intracranial hemorrhages or skull fractures as a result.  Clinical Course as of Apr 30 2051  Thu Apr 29, 2018  1257 Case discussed with Dr. Duffy Bruce. EKG changes seen that are consistent with hypokalemia. U waves and prolonged QT at 508. Will order magnesium prior to administering potassium for replenishment.   [GM]  1327 Potassium 2.3. Increase creatinine from last result in 10/2017 which was 1.33. Possible renal etiology contributing to hypokalemia.   [GM]  1343 Magnesium WNL. Will begin potassium replenishment with 10 mEq IV  x2 + PO 40 mEq. Willl recheck EKG and potassium in 3-4 hours. 1L of LR given.   [GM]  1 CT Head show no acute intracranial abnormalities, hemorrhages or new areas of infarct/ischemia. Chronic cerebral white matter disease that is unchanged from 2018 imaging and likely a contributor to pt's cognitive ability today.   [GM]  [facility] to please start second IV of potassium.   [GM]  1919 2nd rounds of IV and PO potassium initiated. Ordering recheck on BMP to evaluate potassium.   [GM]  2020 Repeat potassium was 2.3. She has received 25mEq PO and 20 mEq IV potassium over the last 5 hours. Inpatient hospitalization recommended as patient has had no change in potassium level in ED despite PO and IV formulations. Also patient has significant EKG changes that need to be monitored.  Consult placed to [facility] for admission.   [GM]  2040 Case discussed with [facility] who will admit the patient.   [GM]    Clinical Course User Index [GM] Hadyn Blanck, Jonelle Sports, PA-C    Final Clinical Impressions(s) / ED  Diagnoses  1. Hypokalemia. Received a total of 192mEq of potassium in the ED without any change in potassium level. Consult placed to [facility] who will admit patient.  Dispo: Admit.   Final diagnoses:  Hypokalemia  ED Discharge Orders    None        Junita Push 04/29/18 2052    Duffy Bruce, MD 04/29/18 2300

## 2018-04-29 NOTE — Telephone Encounter (Signed)
Unable to contact patient after multiple attempts.  Called 911 for wellness check to inform patient to proceed to ER asap.

## 2018-04-29 NOTE — Progress Notes (Deleted)
[facility]  PATIENT: Lindsey Goodman DOB: Jun 28, 1941   REASON FOR VISIT: Follow-up for short-term memory loss  HISTORY FROM: Patient husband and daughter    HISTORY OF PRESENT ILLNESS:UPDATE 1/28/2019CM Lindsey Goodman, 77 year old female returns for follow-up with short-term memory loss.  She was started on Namenda in September by Dr. Leta Baptist tolerating the medication okay.  Memory continues to decline according to the daughter and husband.  She gets no regular exercise she does not read the paper.  She watches TV most of the day.  She is independent with ADLs.  She no longer drives.  She no longer cooks.  She returns for reevaluation  UPDATE (07/01/17, VRP): Since last visit, doing worse. More short term memory loss. Cannot use remote control. Diff with her pills. Cannot put away dishes properly. No longer driving. Diff with conversations. More weight loss. Poor appetite.   PRIOR HPI (03/06/17): 77 year old female here for evaluation of memory loss. Patient has history of hypertension and hypercholesteremia. According to patient she has noted some mild decreased energy and mild memory problems for past few weeks. She is somewhat dismissive of these issues does not think it is that much of a problem. She tells me that she is able to shop, drive, take care of her own personal needs without any problem. According to patient's husband and also patient's daughter (via notes and report through her dad) patient has had 6-12 months of gradual onset progressive short-term memory loss, low energy, poor eating habits, repeating herself, confusion, disinterest in activities. Patient's husband saw similar problems in his own mother who was diagnosed with advanced dementia late in life. Patient is concerned about similar diagnosis for his wife. No specific triggering or aggravating factors. Patient's daughter has taken over management of her medications. Patient still drives short distances on her  own. Longer distance travel is managed by patient's husband.    REVIEW OF SYSTEMS: Full 14 system review of systems performed and notable only for those listed, all others are neg:  Constitutional: neg  Cardiovascular: neg Ear/Nose/Throat: neg  Skin: neg Eyes: neg Respiratory: neg Gastroitestinal: neg  Hematology/Lymphatic: neg  Endocrine: neg Musculoskeletal:neg Allergy/Immunology: neg Neurological: Memory loss Psychiatric: Decreased concentration Sleep : neg   ALLERGIES: Allergies  Allergen Reactions  . Atorvastatin Other (See Comments)    Pt states she felt confused.  . Amlodipine Besylate Swelling  . Prednisone     No appetite    HOME MEDICATIONS: Facility-Administered Medications Prior to Visit  Medication Dose Route Frequency Provider Last Rate Last Dose  . lactated ringers bolus 1,000 mL  1,000 mL Intravenous Once Mortis, Gabrielle I, PA-C 500 mL/hr at 04/29/18 1456 1,000 mL at 04/29/18 1456   Outpatient Medications Prior to Visit  Medication Sig Dispense Refill  . amLODipine (NORVASC) 5 MG tablet Take 1 tablet (5 mg total) by mouth daily. Keep OV. 30 tablet 0  . donepezil (ARICEPT) 5 MG tablet TAKE 1 TABLET BY MOUTH EVERY DAY 90 tablet 2  . ezetimibe (ZETIA) 10 MG tablet Take 1 tablet (10 mg total) by mouth daily. 90 tablet 3  . levothyroxine (SYNTHROID, LEVOTHROID) 75 MCG tablet TAKE 1 TABLET (75 MCG TOTAL) BY MOUTH DAILY BEFORE BREAKFAST. 30 tablet 3  . memantine (NAMENDA) 10 MG tablet TAKE 1 TABLET BY MOUTH TWICE A DAY 180 tablet 3  . metoprolol tartrate (LOPRESSOR) 50 MG tablet Take 1 tablet (50 mg total) by mouth 2 (two) times daily. 180 tablet 3  . olmesartan (BENICAR) 40 MG  tablet TAKE 1 TABLET BY MOUTH EVERY DAY 90 tablet 0  . rosuvastatin (CRESTOR) 40 MG tablet Take 1 tablet (40 mg total) by mouth daily. Keep OV 90 tablet 0    PAST MEDICAL HISTORY: Past Medical History:  Diagnosis Date  . GERD (gastroesophageal reflux disease)   . Hx of Doppler  ultrasound 12/09/2010   Renal duplexsuggested distal abdominal aorta segment in the origin of the right and left commom iliac arteries with elevated velocities consistent with at least 50% diameter reduction, 60% narrowing in the right renal artery with stable PSV at 210.  Marland Kitchen Hx of echocardiogram 12/09/2010   EF 55% showed mild concrentric LVH with normal systolic function and grade 1 diastolic dysfunction. she had mild to moderate LA dilation, mild to moderate mitral annular calcification with moderate MR, and she had trival tricupid regurgitation.  . Hyperlipidemia   . Hypertension   . Hypothyroidism     PAST SURGICAL HISTORY: Past Surgical History:  Procedure Laterality Date  . no prior surgery    . WISDOM TOOTH EXTRACTION  1980    FAMILY HISTORY: Family History  Problem Relation Age of Onset  . Hypertension Mother   . Stroke Mother   . Hypertension Father   . Stroke Father   . Colon cancer Neg Hx   . Stomach cancer Neg Hx     SOCIAL HISTORY: Social History   Socioeconomic History  . Marital status: Married    Spouse name: Alvester Chou  . Number of children: 2  . Years of education: 77  . Highest education level: Not on file  Occupational History    Comment: retired  Scientific laboratory technician  . Financial resource strain: Not on file  . Food insecurity:    Worry: Not on file    Inability: Not on file  . Transportation needs:    Medical: Not on file    Non-medical: Not on file  Tobacco Use  . Smoking status: Never Smoker  . Smokeless tobacco: Never Used  Substance and Sexual Activity  . Alcohol use: No  . Drug use: No  . Sexual activity: Yes    Birth control/protection: None  Lifestyle  . Physical activity:    Days per week: Not on file    Minutes per session: Not on file  . Stress: Not on file  Relationships  . Social connections:    Talks on phone: Not on file    Gets together: Not on file    Attends religious service: Not on file    Active member of club or  organization: Not on file    Attends meetings of clubs or organizations: Not on file    Relationship status: Not on file  . Intimate partner violence:    Fear of current or ex partner: Not on file    Emotionally abused: Not on file    Physically abused: Not on file    Forced sexual activity: Not on file  Other Topics Concern  . Not on file  Social History Narrative   Lives with husband   Caffeine- none   Education 14     PHYSICAL EXAM  There were no vitals filed for this visit. There is no height or weight on file to calculate BMI.  Generalized: Well developed, in no acute distress  Head: normocephalic and atraumatic,. Oropharynx benign  Neck: Supple, Musculoskeletal: No deformity   Neurological examination   Mentation: Alert. AFT5 Clock drawing 2/4 MMSE - Mini Mental State Exam 11/02/2017 07/01/2017 03/06/2017  Orientation to time 1 3 4   Orientation to Place 3 4 3   Registration 3 3 3   Attention/ Calculation 1 0 3  Recall 1 1 1   Language- name 2 objects 2 2 2   Language- repeat 1 1 1   Language- follow 3 step command 3 3 3   Language- read & follow direction 1 1 1   Write a sentence 1 1 1   Copy design 0 0 1  Total score 17 19 23     Follows all commands speech and language fluent.   Cranial nerve II-XII: Pupils were equal round reactive to light extraocular movements were full, visual field were full on confrontational test. Facial sensation and strength were normal. hearing was intact to finger rubbing bilaterally. Uvula tongue midline. head turning and shoulder shrug were normal and symmetric.Tongue protrusion into cheek strength was normal. Motor: normal bulk and tone, full strength in the BUE, BLE,  Sensory: normal and symmetric to light touch, Coordination: finger-nose-finger, heel-to-shin bilaterally, no dysmetria Reflexes: Symmetric upper and lower, plantar responses were flexor bilaterally. Gait and Station: Rising up from seated position without assistance,  narrow-based stance able  to perform tiptoe, and heel walking without difficulty. Tandem gait is unsteady  DIAGNOSTIC DATA (LABS, IMAGING, TESTING) - I reviewed patient records, labs, notes, testing and imaging myself where available.  Lab Results  Component Value Date   WBC 6.1 04/29/2018   HGB 14.2 04/29/2018   HCT 41.4 04/29/2018   MCV 88.8 04/29/2018   PLT 116 (L) 04/29/2018      Component Value Date/Time   NA 144 04/29/2018 1227   NA 146 (H) 04/28/2018 1434   K 2.3 (LL) 04/29/2018 1227   CL 94 (L) 04/29/2018 1227   CO2 33 (H) 04/29/2018 1227   GLUCOSE 111 (H) 04/29/2018 1227   BUN 15 04/29/2018 1227   BUN 10 04/28/2018 1434   CREATININE 1.69 (H) 04/29/2018 1227   CREATININE 0.91 01/30/2017 1137   CALCIUM 9.7 04/29/2018 1227   PROT 6.8 04/29/2018 1227   PROT 7.4 04/28/2018 1434   ALBUMIN 4.1 04/29/2018 1227   ALBUMIN 4.8 04/28/2018 1434   AST 75 (H) 04/29/2018 1227   ALT 57 (H) 04/29/2018 1227   ALKPHOS 66 04/29/2018 1227   BILITOT 1.2 04/29/2018 1227   BILITOT 0.7 04/28/2018 1434   GFRNONAA 28 (L) 04/29/2018 1227   GFRAA 33 (L) 04/29/2018 1227   Lab Results  Component Value Date   CHOL 142 04/28/2018   HDL 55 04/28/2018   LDLCALC 63 04/28/2018   TRIG 119 04/28/2018   CHOLHDL 2.6 04/28/2018   Lab Results  Component Value Date   HGBA1C 6.1 (H) 07/27/2013   Lab Results  Component Value Date   VITAMINB12 1,329 (H) 03/06/2017   Lab Results  Component Value Date   TSH 9.990 (H) 04/28/2018      ASSESSMENT AND PLAN 77 y.o. year old female here with gradual onset progressive short-term memory problems, confusion, repeating herself, decreased energy, with MMSE of 23/30 --> 19/30,>17/30. poor insight. Findings are consistent with  dementia.       PLAN: Continue  memantine 10mg  twice a day  Add Aricept 5mg  every am with breakfast Continue medication oversight by daughter  Recommend walking or exercise for overall health and well-being Strategies  games, crossword puzzles Reviewed and home safety measures Follow-up in 6 months May consider SSRI in future Dennie Bible, [facility], [facility], APRN  [facility] [address], [place], [place] [address] [phone]

## 2018-04-29 NOTE — ED Triage Notes (Signed)
Pt called by her dr for low potassium level and sent here. Pt also has had 3 falls in the last 3 days, one of which she hit the left side of her head. Pt is on eliquis. No loc. VSS.

## 2018-04-29 NOTE — Telephone Encounter (Signed)
Received call from police officer-family and patient aware and will take patient to ER "today".

## 2018-04-29 NOTE — Telephone Encounter (Signed)
Patient, husband, and son walked in office-advised to report to ER asap per DOD due to critically low potassium.   Request additional contact information in order to avoid this situation in the future if we need to contact patient-son request spouse to not give "office number" and spouse states he will think about it and call with another number.   Again advised to go to ER as this is critical and could be very dangerous to patient.  Advised they would go to ER now.  Called triage nurse to make aware of patient arrival.

## 2018-04-29 NOTE — Telephone Encounter (Signed)
Per DOD, patient needs to proceed to ER for potassium supplementation.   Attempt to call cell and home phone multiple times without success, no answer and unable to leave VM

## 2018-04-29 NOTE — ED Notes (Signed)
Sunquest is not working. White labels used.

## 2018-04-30 ENCOUNTER — Encounter (HOSPITAL_COMMUNITY): Payer: PPO

## 2018-04-30 DIAGNOSIS — Z888 Allergy status to other drugs, medicaments and biological substances status: Secondary | ICD-10-CM | POA: Diagnosis not present

## 2018-04-30 DIAGNOSIS — E039 Hypothyroidism, unspecified: Secondary | ICD-10-CM | POA: Diagnosis present

## 2018-04-30 DIAGNOSIS — Z8249 Family history of ischemic heart disease and other diseases of the circulatory system: Secondary | ICD-10-CM | POA: Diagnosis not present

## 2018-04-30 DIAGNOSIS — E785 Hyperlipidemia, unspecified: Secondary | ICD-10-CM | POA: Diagnosis present

## 2018-04-30 DIAGNOSIS — I129 Hypertensive chronic kidney disease with stage 1 through stage 4 chronic kidney disease, or unspecified chronic kidney disease: Secondary | ICD-10-CM | POA: Diagnosis present

## 2018-04-30 DIAGNOSIS — I340 Nonrheumatic mitral (valve) insufficiency: Secondary | ICD-10-CM | POA: Diagnosis present

## 2018-04-30 DIAGNOSIS — E876 Hypokalemia: Secondary | ICD-10-CM | POA: Diagnosis present

## 2018-04-30 DIAGNOSIS — Z823 Family history of stroke: Secondary | ICD-10-CM | POA: Diagnosis not present

## 2018-04-30 DIAGNOSIS — K219 Gastro-esophageal reflux disease without esophagitis: Secondary | ICD-10-CM | POA: Diagnosis present

## 2018-04-30 DIAGNOSIS — N183 Chronic kidney disease, stage 3 (moderate): Secondary | ICD-10-CM | POA: Diagnosis present

## 2018-04-30 DIAGNOSIS — E873 Alkalosis: Secondary | ICD-10-CM | POA: Diagnosis present

## 2018-04-30 DIAGNOSIS — F0390 Unspecified dementia without behavioral disturbance: Secondary | ICD-10-CM | POA: Diagnosis present

## 2018-04-30 DIAGNOSIS — Z79890 Hormone replacement therapy (postmenopausal): Secondary | ICD-10-CM | POA: Diagnosis not present

## 2018-04-30 DIAGNOSIS — N179 Acute kidney failure, unspecified: Secondary | ICD-10-CM | POA: Diagnosis present

## 2018-04-30 DIAGNOSIS — D696 Thrombocytopenia, unspecified: Secondary | ICD-10-CM | POA: Diagnosis present

## 2018-04-30 LAB — CBC
HCT: 35.5 % — ABNORMAL LOW (ref 36.0–46.0)
Hemoglobin: 11.9 g/dL — ABNORMAL LOW (ref 12.0–15.0)
MCH: 30.8 pg (ref 26.0–34.0)
MCHC: 33.5 g/dL (ref 30.0–36.0)
MCV: 92 fL (ref 78.0–100.0)
Platelets: 148 10*3/uL — ABNORMAL LOW (ref 150–400)
RBC: 3.86 MIL/uL — ABNORMAL LOW (ref 3.87–5.11)
RDW: 13.2 % (ref 11.5–15.5)
WBC: 6.2 10*3/uL (ref 4.0–10.5)

## 2018-04-30 LAB — URINALYSIS, ROUTINE W REFLEX MICROSCOPIC
Bilirubin Urine: NEGATIVE
Glucose, UA: NEGATIVE mg/dL
Ketones, ur: NEGATIVE mg/dL
Leukocytes, UA: NEGATIVE
Nitrite: NEGATIVE
Protein, ur: 30 mg/dL — AB
Specific Gravity, Urine: 1.004 — ABNORMAL LOW (ref 1.005–1.030)
pH: 9 — ABNORMAL HIGH (ref 5.0–8.0)

## 2018-04-30 LAB — BASIC METABOLIC PANEL
Anion gap: 15 (ref 5–15)
BUN: 9 mg/dL (ref 8–23)
CO2: 33 mmol/L — ABNORMAL HIGH (ref 22–32)
Calcium: 8.9 mg/dL (ref 8.9–10.3)
Chloride: 100 mmol/L (ref 98–111)
Creatinine, Ser: 1.4 mg/dL — ABNORMAL HIGH (ref 0.44–1.00)
GFR calc Af Amer: 41 mL/min — ABNORMAL LOW (ref >60)
GFR calc non Af Amer: 35 mL/min — ABNORMAL LOW (ref >60)
Glucose, Bld: 102 mg/dL — ABNORMAL HIGH (ref 70–99)
Potassium: 2.2 mmol/L — CL (ref 3.5–5.1)
Sodium: 148 mmol/L — ABNORMAL HIGH (ref 135–145)

## 2018-04-30 LAB — OSMOLALITY, URINE: Osmolality, Ur: 208 mOsm/kg — ABNORMAL LOW (ref 300–900)

## 2018-04-30 LAB — OSMOLALITY: Osmolality: 303 mOsm/kg — ABNORMAL HIGH (ref 275–295)

## 2018-04-30 LAB — NA AND K (SODIUM & POTASSIUM), RAND UR
Potassium Urine: 18 mmol/L
Sodium, Ur: 71 mmol/L

## 2018-04-30 LAB — POTASSIUM: Potassium: 3 mmol/L — ABNORMAL LOW (ref 3.5–5.1)

## 2018-04-30 LAB — MAGNESIUM: Magnesium: 2.4 mg/dL (ref 1.7–2.4)

## 2018-04-30 LAB — CHLORIDE, URINE, RANDOM: Chloride Urine: 44 mmol/L

## 2018-04-30 MED ORDER — POTASSIUM CHLORIDE 10 MEQ/100ML IV SOLN
10.0000 meq | INTRAVENOUS | Status: AC
  Administered 2018-04-30 (×6): 10 meq via INTRAVENOUS
  Filled 2018-04-30 (×6): qty 100

## 2018-04-30 MED ORDER — POTASSIUM CHLORIDE CRYS ER 20 MEQ PO TBCR
40.0000 meq | EXTENDED_RELEASE_TABLET | Freq: Once | ORAL | Status: AC
  Administered 2018-04-30: 40 meq via ORAL
  Filled 2018-04-30: qty 2

## 2018-04-30 MED ORDER — AMLODIPINE BESYLATE 10 MG PO TABS
10.0000 mg | ORAL_TABLET | Freq: Every day | ORAL | Status: DC
  Administered 2018-04-30 – 2018-05-04 (×5): 10 mg via ORAL
  Filled 2018-04-30 (×5): qty 1

## 2018-04-30 NOTE — Progress Notes (Signed)
Received consult for [address] needs; awaiting on Physical Therapy eval to access functional capacity - home vs SNF; B Pennie Rushing [phone]

## 2018-04-30 NOTE — Progress Notes (Addendum)
PROGRESS NOTE    Lindsey Goodman  PQZ:[id number] DOB: 1940/12/30 DOA: 04/29/2018 PCP: Troy Sine, MD    Brief Narrative:  77 year old with past medical history relevant for hypertension, stage III CKD, hyperlipidemia, hypothyroidism, dementia who came in for recurrent falls and was found to have profound hypokalemia and mildly elevated LFTs.   Assessment & Plan:   Principal Problem:   Acute renal failure superimposed on stage 3 chronic kidney disease (HCC) Active Problems:   Essential hypertension   Hypothyroidism   Hyperlipidemia   Dementia   Hypokalemia   Fall   Abnormal LFTs   #) Profound hypokalemia: On review of the patient's medications she does not appear to be on any medications that would induce such profound hypokalemia.  Interestingly enough she is on actually an ARB.  Unfortunately she does have some underlying dementia and it is unclear how reliable of a narrator she is however she denies any nausea, vomiting, diarrhea.  With no evidence of intestinal losses and no evidence of intracellular shift query whether renal losses could be the cause of her hypokalemia.  She has a metabolic alkalosis suggesting strongly diuretic use despite it not being on her medication list or possibly Bartter's or Gettleman syndrome.  She is hypertensive but not profoundly so though additionally she is on 3 antihypertensives at home.  Additionally with the chronic kidney disease renovascular cause, primary mineralocorticoid excess or possibilities.  She is not [place] rasion decreasing the possibility of thyrotoxic periodic paralysis.  Additionally her TSH is if anything mildly elevated at 9.99 suggesting that she is very mildly hypothyroid. -Aggressive repletion with 40 mEq p.o. and 60 mEq IV.  Recheck after repletion -Urine potassium, chloride, pH, and sodium -Renal vascular ultrasound -We will consider urine diuretic screen if there is concern for surreptitious diuretic intake -We will  evaluate for possible diarrhea by monitoring here - We will send off plasma aldosterone concentration and plasma renin activity -Telemetry  #) Fall: Likely secondary to muscle weakness from hypokalemia.   -PT consult  #) Hypertension: -Continue amlodipine 5 mg daily -Hold on losartan 40 mg daily - Continue metoprolol tartrate 50 mg twice daily  #) Abnormal LFTs: Very mild hepatocellular pattern of liver injury.  This was not present approximately 6 months ago. - Monitor  #) Hypothyroidism: -Continue levothyroxine 100 mg daily  #) Stage III CKD: Stable  #) Dementia: -Continue memantine 10 mg twice daily -Continue donepezil 5 mg daily  #) Hyperlipidemia: - Continue Zetia 10 mg daily -Continue rosuvastatin 40 mg daily, no reason to hold in the setting of mild LFT elevation  Fluids: Gentle IV fluids Elect lites: Per above Nutrition: Regular diet  Prophylaxis: Enoxaparin  Disposition: Pending normalization of potassium  Full code    Consultants:   None  Procedures:   None  Antimicrobials:   None   Subjective: Patient reports she is doing fairly well.  She denies any symptoms.  She denies any nausea, vomiting, diarrhea.  It is unclear at this time how reliable of narrator she is.  Objective: Vitals:   04/29/18 2310 04/30/18 0325 04/30/18 0511 04/30/18 1144  BP: (!) 176/66 (!) 127/53 (!) 146/60 119/67  Pulse: 67 (!) 54 (!) 54 (!) 55  Resp:  16 18 20   Temp:  98.2 F (36.8 C) 98 F (36.7 C) 97.8 F (36.6 C)  TempSrc:  Oral Oral Oral  SpO2:  100% 98% 95%  Weight:  46.1 kg (101 lb 11.2 oz)    Height:  Intake/Output Summary (Last 24 hours) at 04/30/2018 1237 Last data filed at 04/30/2018 0950 Gross per 24 hour  Intake 2143.19 ml  Output 700 ml  Net 1443.19 ml   Filed Weights   04/29/18 1202 04/30/18 0325  Weight: 44.5 kg (98 lb) 46.1 kg (101 lb 11.2 oz)    Examination:  General exam: Appears calm and comfortable  Respiratory system: Clear  to auscultation. Respiratory effort normal. Cardiovascular system: Regular rate and rhythm, no murmurs Gastrointestinal system: Soft, nondistended, no rebound or guarding, plus bowel sounds Central nervous system: Alert and oriented. No focal neurological deficits. Extremities: Trace lower extremity edema Skin: Senile purpura, contusions on left upper extremity Psychiatry: Unable to assess due to underlying dementia    Data Reviewed: I have personally reviewed following labs and imaging studies  CBC: Recent Labs  Lab 04/28/18 1434 04/29/18 1227 04/30/18 0545  WBC 7.1 6.1 6.2  NEUTROABS 4.8 2.9  --   HGB 15.0 14.2 11.9*  HCT 44.5 41.4 35.5*  MCV 89 88.8 92.0  PLT 190 116* 833*   Basic Metabolic Panel: Recent Labs  Lab 04/28/18 1434 04/29/18 1227 04/29/18 1938 04/30/18 0545  NA 146* 144 146* 148*  K 1.9* 2.3* 2.3* 2.2*  CL 91* 94* 99 100  CO2 28 33* 36* 33*  GLUCOSE 134* 111* 121* 102*  BUN 10 15 13 9   CREATININE 1.36* 1.69* 1.55* 1.40*  CALCIUM 10.5* 9.7 9.4 8.9  MG 2.4* 2.4  --  2.4  PHOS 2.2*  --   --   --    GFR: Estimated Creatinine Clearance: 24.9 mL/min (A) (by C-G formula based on SCr of 1.4 mg/dL (H)). Liver Function Tests: Recent Labs  Lab 04/28/18 1434 04/29/18 1227  AST 92* 75*  ALT 64* 57*  ALKPHOS 82 66  BILITOT 0.7 1.2  PROT 7.4 6.8  ALBUMIN 4.8 4.1   No results for input(s): LIPASE, AMYLASE in the last 168 hours. No results for input(s): AMMONIA in the last 168 hours. Coagulation Profile: No results for input(s): INR, PROTIME in the last 168 hours. Cardiac Enzymes: No results for input(s): CKTOTAL, CKMB, CKMBINDEX, TROPONINI in the last 168 hours. BNP (last 3 results) No results for input(s): PROBNP in the last 8760 hours. HbA1C: No results for input(s): HGBA1C in the last 72 hours. CBG: No results for input(s): GLUCAP in the last 168 hours. Lipid Profile: Recent Labs    04/28/18 1434  CHOL 142  HDL 55  LDLCALC 63  TRIG 119    CHOLHDL 2.6   Thyroid Function Tests: Recent Labs    04/28/18 1434  TSH 9.990*   Anemia Panel: No results for input(s): VITAMINB12, FOLATE, FERRITIN, TIBC, IRON, RETICCTPCT in the last 72 hours. Sepsis Labs: No results for input(s): PROCALCITON, LATICACIDVEN in the last 168 hours.  No results found for this or any previous visit (from the past 240 hour(s)).       Radiology Studies: Ct Head Wo Contrast  Result Date: 04/29/2018 CLINICAL DATA:  77 year old female with multiple recent falls on Eliquis. Struck the side of her head. Hypokalemia. EXAM: CT HEAD WITHOUT CONTRAST TECHNIQUE: Contiguous axial images were obtained from the base of the skull through the vertex without intravenous contrast. COMPARISON:  Brain MRI 03/27/2017. FINDINGS: Brain: Patchy and confluent bilateral cerebral white matter hypodensity similar to the abnormal white matter FLAIR signal in 2018. Stable cerebral volume. No midline shift, ventriculomegaly, mass effect, evidence of mass lesion, intracranial hemorrhage or evidence of cortically based acute infarction. No  cortical encephalomalacia identified. Vascular: Calcified atherosclerosis at the skull base. No suspicious intracranial vascular hyperdensity. Skull: No fracture or osseous abnormality identified. Sinuses/Orbits: Visualized paranasal sinuses and mastoids are stable and well pneumatized. Other: Posterior right convexity scalp soft tissue injury with swelling and subcutaneous gas (series 3, image 57). The underlying right calvarium appears intact. Contralateral left superior region soft tissue injury with trace gas (series 3, image 66). Underlying calvarium appears intact. Other scalp soft tissues appear normal. Visualized orbit soft tissues are within normal limits. Negative visible deep soft tissue spaces of the face. IMPRESSION: 1. Bilateral superficial soft tissue injuries with scalp lacerations and mild hematoma. No underlying skull fracture. 2. No acute  intracranial abnormality. Chronic cerebral white matter disease appears stable since the MRI in 2018. Electronically Signed   By: Genevie Ann M.D.   On: 04/29/2018 13:20        Scheduled Meds: . amLODipine  10 mg Oral Daily  . donepezil  5 mg Oral Daily  . ezetimibe  10 mg Oral Daily  . levothyroxine  100 mcg Oral QAC breakfast  . memantine  10 mg Oral BID  . metoprolol tartrate  50 mg Oral BID  . rosuvastatin  40 mg Oral Daily   Continuous Infusions: . potassium chloride 10 mEq (04/30/18 1151)     LOS: 0 days    Time spent: [place], MD [facility]  If 7PM-7AM, please contact night-coverage [email or web address] Password [facility] 04/30/2018, 12:37 PM

## 2018-04-30 NOTE — Progress Notes (Signed)
PT Cancellation Note  Patient Details Name: Lindsey Goodman MRN: [id number] DOB: December 24, 1940   Cancelled Treatment:    Reason Eval/Treat Not Completed: Medical issues which prohibited therapy(current K of 2.2 after repletion yesterday with additional K+ ordered. current value below 2.5 and will hold until medically cleared)   [name] [name] 04/30/2018, 9:25 AM [name], [name]

## 2018-04-30 NOTE — Progress Notes (Signed)
CRITICAL VALUE ALERT  Critical Value: K level of 2.2  Date & Time Notied: 04/30/18 at [place]  Provider Notified: Dr. Herbert Moors via Amion  Orders Received/Actions taken: Waiting for call back

## 2018-04-30 NOTE — Progress Notes (Signed)
OT Cancellation Note  Patient Details Name: Lindsey Goodman MRN: [id number] DOB: [birth year]   Cancelled Treatment:    Reason Eval/Treat Not Completed: Medical issues which prohibited therapy.(current K of 2.2 after repletion yesterday with additional K+ ordered. current value below 2.5 and will hold until medically cleared)    Almon Register [phone] 04/30/2018, 9:28 AM

## 2018-04-30 NOTE — Consult Note (Signed)
[place] Nurse wound consult note Patient evaluated in [place] Reason for Consult: Scalp laceration and abrasions from a fall Wound type: No wounds found.  Many bruises along arms, no dried blood in the hair, no open areas. Val Riles, RN, MSN, CWOCN, CNS-BC, pager [phone]

## 2018-04-30 NOTE — Consult Note (Signed)
   [facility] CM Inpatient Consult   04/30/2018  Lindsey Goodman 1940/12/10 [id number]  Patient screened for potential [place] Management services. Patient is in the [place] of the [place] Management services under patient's [employer] plan.  Patient was discussed in the morning progression meeting.  Awaiting PT/OT evals.  Patient is a 77 year old with a HX of but not limited to dementia, hypothyroidism, and falls.  Unable to determine needs at this time due to medical issues preventing PT/OT evaluations.   Please place a [facility] Care Management consult or for questions contact:   Natividad Brood, RN BSN [facility] Liaison  [phone] business mobile phone Toll free office [phone]

## 2018-05-01 ENCOUNTER — Inpatient Hospital Stay (HOSPITAL_COMMUNITY): Payer: PPO

## 2018-05-01 DIAGNOSIS — N183 Chronic kidney disease, stage 3 (moderate): Secondary | ICD-10-CM

## 2018-05-01 DIAGNOSIS — E876 Hypokalemia: Secondary | ICD-10-CM

## 2018-05-01 LAB — BASIC METABOLIC PANEL
Anion gap: 11 (ref 5–15)
BUN: 9 mg/dL (ref 8–23)
CO2: 34 mmol/L — ABNORMAL HIGH (ref 22–32)
Calcium: 9.2 mg/dL (ref 8.9–10.3)
Chloride: 102 mmol/L (ref 98–111)
Creatinine, Ser: 1.59 mg/dL — ABNORMAL HIGH (ref 0.44–1.00)
GFR calc Af Amer: 35 mL/min — ABNORMAL LOW (ref >60)
GFR calc non Af Amer: 30 mL/min — ABNORMAL LOW (ref >60)
Glucose, Bld: 102 mg/dL — ABNORMAL HIGH (ref 70–99)
Potassium: 2.8 mmol/L — ABNORMAL LOW (ref 3.5–5.1)
Sodium: 147 mmol/L — ABNORMAL HIGH (ref 135–145)

## 2018-05-01 LAB — HEPATITIS PANEL, ACUTE
HCV Ab: <0.1 s/co ratio (ref 0.0–0.9)
Hep A IgM: NEGATIVE
Hep B C IgM: NEGATIVE
Hepatitis B Surface Ag: NEGATIVE

## 2018-05-01 LAB — POTASSIUM: Potassium: 3.1 mmol/L — ABNORMAL LOW (ref 3.5–5.1)

## 2018-05-01 LAB — CBC
HCT: 35.3 % — ABNORMAL LOW (ref 36.0–46.0)
Hemoglobin: 11.8 g/dL — ABNORMAL LOW (ref 12.0–15.0)
MCH: 30.8 pg (ref 26.0–34.0)
MCHC: 33.4 g/dL (ref 30.0–36.0)
MCV: 92.2 fL (ref 78.0–100.0)
Platelets: 170 10*3/uL (ref 150–400)
RBC: 3.83 MIL/uL — ABNORMAL LOW (ref 3.87–5.11)
RDW: 13.4 % (ref 11.5–15.5)
WBC: 6.7 10*3/uL (ref 4.0–10.5)

## 2018-05-01 MED ORDER — POTASSIUM CHLORIDE CRYS ER 20 MEQ PO TBCR
40.0000 meq | EXTENDED_RELEASE_TABLET | Freq: Once | ORAL | Status: AC
  Administered 2018-05-01: 40 meq via ORAL
  Filled 2018-05-01: qty 2

## 2018-05-01 MED ORDER — POTASSIUM CHLORIDE 10 MEQ/100ML IV SOLN
INTRAVENOUS | Status: AC
  Administered 2018-05-01: 10 meq via INTRAVENOUS
  Filled 2018-05-01: qty 100

## 2018-05-01 MED ORDER — POTASSIUM CHLORIDE 10 MEQ/100ML IV SOLN
10.0000 meq | INTRAVENOUS | Status: AC
  Administered 2018-05-01 (×6): 10 meq via INTRAVENOUS
  Filled 2018-05-01 (×5): qty 100

## 2018-05-01 MED ORDER — SODIUM CHLORIDE 0.9 % IV SOLN
INTRAVENOUS | Status: DC | PRN
  Administered 2018-05-01: 11:00:00 via INTRAVENOUS

## 2018-05-01 NOTE — Plan of Care (Signed)
  Problem: Coping: Goal: Level of anxiety will decrease Outcome: Completed/Met   Problem: Elimination: Goal: Will not experience complications related to bowel motility Outcome: Completed/Met   

## 2018-05-01 NOTE — Progress Notes (Signed)
Preliminary notes--Bilateral renal arterial duplex exam completed.   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  Right: Normal size right kidney. Cyst(s) noted. No evidence of right renal artery stenosis.   Left: 1-59% stenosis of the left renal artery. Normal size of left kidney. Cyst(s) noted.      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   Hongying Deaunna Olarte (RDMS RVT) 05/01/18 10:56 AM

## 2018-05-01 NOTE — Evaluation (Signed)
Physical Therapy Evaluation Patient Details Name: Lindsey Goodman MRN: [id number] DOB: 1941-04-11 Today's Date: 05/01/2018   History of Present Illness  77 year old with past medical history relevant for hypertension, stage III CKD, hyperlipidemia, hypothyroidism, dementia who came in for recurrent falls and was found to have profound hypokalemia and mildly elevated LFTs.    Clinical Impression  Pt admitted with above diagnosis. Pt currently with functional limitations due to the deficits listed below (see PT Problem List). PTA, pt reports independence with mobility, living with husband and 2 adult children in 4 story home. Today, pt presents with mild balance impairments during gait. Ambulates unit and stairs with min guard for safety. Pt adamantely denies falls despite history, apparent lack of safety awareness. Pt affirms she will have family with her when navigating stairs and "doesn't go running around town without anybody". Refuses to trial AD or receive HHPT to progress balance. VSS during session on RA.  Pt will benefit from skilled PT to increase their independence and safety with mobility to allow discharge to the venue listed below.       Follow Up Recommendations Home health PT;Supervision for mobility/OOB(Pt denying HHPT)    Equipment Recommendations  None recommended by PT    Recommendations for Other Services       Precautions / Restrictions Precautions Precautions: Fall Restrictions Weight Bearing Restrictions: No      Mobility  Bed Mobility Overal bed mobility: Independent                Transfers Overall transfer level: Independent Equipment used: None                Ambulation/Gait Ambulation/Gait assistance: Min Conservator, museum/gallery (Feet): 200 Feet Assistive device: None Gait Pattern/deviations: Staggering left;Staggering right Gait velocity: decrease   General Gait Details: Patient ambulates with slight posterior lean and unsteady gait.  refusing RW trial. pt reports this is close to baseline although she is tired   Stairs Stairs: Yes Stairs assistance: Min guard Stair Management: One rail Right Number of Stairs: 8 General stair comments: alternating pattern on stairs, min guard for safety. pt advised not to perform stairs without family help  Wheelchair Mobility    Modified Rankin (Stroke Patients Only)       Balance Overall balance assessment: Mild deficits observed, not formally tested                                           Pertinent Vitals/Pain Pain Assessment: No/denies pain    Home Living Family/patient expects to be discharged to:: Private residence Living Arrangements: Spouse/significant other;Children Available Help at Discharge: Family;Available 24 hours/day Type of Home: House Home Access: Level entry     Home Layout: Two level(4 level home) Home Equipment: None      Prior Function Level of Independence: Independent               Hand Dominance        Extremity/Trunk Assessment   Upper Extremity Assessment Upper Extremity Assessment: Overall WFL for tasks assessed    Lower Extremity Assessment Lower Extremity Assessment: Overall WFL for tasks assessed       Communication   Communication: No difficulties  Cognition Arousal/Alertness: Awake/alert Behavior During Therapy: Impulsive Overall Cognitive Status: Within Functional Limits for tasks assessed  General Comments      Exercises     Assessment/Plan    PT Assessment Patient needs continued PT services  PT Problem List Decreased strength;Decreased range of motion;Decreased safety awareness       PT Treatment Interventions DME instruction;Stair training;Functional mobility training    PT Goals (Current goals can be found in the Care Plan section)  Acute Rehab PT Goals Patient Stated Goal: go home when able PT Goal Formulation: With  patient Time For Goal Achievement: 05/08/18 Potential to Achieve Goals: Good    Frequency Min 3X/week   Barriers to discharge   4 level home    Co-evaluation               AM-PAC PT "6 Clicks" Daily Activity  Outcome Measure Difficulty turning over in bed (including adjusting bedclothes, sheets and blankets)?: None Difficulty moving from lying on back to sitting on the side of the bed? : None Difficulty sitting down on and standing up from a chair with arms (e.g., wheelchair, bedside commode, etc,.)?: None Help needed moving to and from a bed to chair (including a wheelchair)?: A Little Help needed walking in hospital room?: A Little Help needed climbing 3-5 steps with a railing? : A Little 6 Click Score: 21    End of Session Equipment Utilized During Treatment: Gait belt Activity Tolerance: Patient tolerated treatment well Patient left: in bed;with call bell/phone within reach;with nursing/sitter in room Nurse Communication: Mobility status PT Visit Diagnosis: Unsteadiness on feet (R26.81);Muscle weakness (generalized) (M62.81)    Time: 8101-7510 PT Time Calculation (min) (ACUTE ONLY): 25 min   Charges:   PT Evaluation $PT Eval Low Complexity: 1 Low PT Treatments $Gait Training: 8-22 mins        Reinaldo Berber, PT, DPT Acute Rehab Services Pager: [phone]    Reinaldo Berber 05/01/2018, 12:12 PM

## 2018-05-01 NOTE — Progress Notes (Signed)
PROGRESS NOTE    Lindsey Goodman  BTD:[id number] DOB: 22-Apr-1941 DOA: 04/29/2018 PCP: Troy Sine, MD    Brief Narrative:  77 year old with past medical history relevant for hypertension, stage III CKD, hyperlipidemia, hypothyroidism, dementia who came in for recurrent falls and was found to have profound hypokalemia and mildly elevated LFTs.   Assessment & Plan:   Principal Problem:   Acute renal failure superimposed on stage 3 chronic kidney disease (HCC) Active Problems:   Essential hypertension   Hypothyroidism   Hyperlipidemia   Dementia   Hypokalemia   Fall   Abnormal LFTs   #) Profound hypokalemia: Patient was not on any medications that would induce such hypokalemia.  She does not have any history of nausea, vomiting or diarrhea.  It is not clear why patient is a profoundly hypokalemic.  Patient is been hypokalemic for at least a little bit of time.  She is also quite alkalotic suggesting either primary hyperaldosteronism or renal artery stenosis.  She continues to be hypokalemic despite aggressive repletion. -Aggressive repletion with 40 mEq p.o. and 60 mEq IV.  Recheck after repletion -Urine potassium, chloride, pH, and sodium pending -Renal vascular ultrasound pending -We will consider urine diuretic screen if there is concern for surreptitious diuretic intake -  plasma aldosterone concentration and plasma renin activity pending -Telemetry  #) Fall: Likely secondary to muscle weakness from hypokalemia.   -PT consult, pending  #) Hypertension: -Continue amlodipine 5 mg daily -Hold on losartan 40 mg daily - Continue metoprolol tartrate 50 mg twice daily  #) Abnormal LFTs: Very mild hepatocellular pattern of liver injury.  This was not present approximately 6 months ago. - Monitor  #) Hypothyroidism: -Continue levothyroxine 100 mg daily  #) Stage III CKD: Stable  #) Dementia: -Continue memantine 10 mg twice daily -Continue donepezil 5 mg daily  #)  Hyperlipidemia: - Continue Zetia 10 mg daily -Continue rosuvastatin 40 mg daily, no reason to hold in the setting of mild LFT elevation  Fluids: Gentle IV fluids Elect lites: Per above Nutrition: Regular diet  Prophylaxis: Enoxaparin  Disposition: Pending normalization of potassium  Full code    Consultants:   None  Procedures:   None  Antimicrobials:   None   Subjective: Patient reports she is doing fairly well.  She denies any symptoms.  She denies any nausea, vomiting, diarrhea.  Objective: Vitals:   05/01/18 0037 05/01/18 0438 05/01/18 0805 05/01/18 1024  BP: 124/62 134/67 (!) 135/58 (!) 162/73  Pulse: 85 (!) 55 (!) 53 68  Resp: 16 16    Temp: 98.2 F (36.8 C) 98.3 F (36.8 C) (!) 97.4 F (36.3 C)   TempSrc: Oral Oral Oral   SpO2: 98% 96% 98%   Weight:  46.9 kg (103 lb 4.8 oz)    Height:        Intake/Output Summary (Last 24 hours) at 05/01/2018 1134 Last data filed at 05/01/2018 1042 Gross per 24 hour  Intake 1714.68 ml  Output 1025 ml  Net 689.68 ml   Filed Weights   04/29/18 1202 04/30/18 0325 05/01/18 0438  Weight: 44.5 kg (98 lb) 46.1 kg (101 lb 11.2 oz) 46.9 kg (103 lb 4.8 oz)    Examination:  General exam: Appears calm and comfortable  Respiratory system: Clear to auscultation. Respiratory effort normal. Cardiovascular system: Regular rate and rhythm, no murmurs Gastrointestinal system: Soft, nondistended, no rebound or guarding, plus bowel sounds Central nervous system: Alert and oriented. No focal neurological deficits. Extremities: Trace lower extremity  edema Skin: Senile purpura, contusions on left upper extremity Psychiatry: Unable to assess due to underlying dementia    Data Reviewed: I have personally reviewed following labs and imaging studies  CBC: Recent Labs  Lab 04/28/18 1434 04/29/18 1227 04/30/18 0545 05/01/18 0528  WBC 7.1 6.1 6.2 6.7  NEUTROABS 4.8 2.9  --   --   HGB 15.0 14.2 11.9* 11.8*  HCT 44.5 41.4  35.5* 35.3*  MCV 89 88.8 92.0 92.2  PLT 190 116* 148* 903   Basic Metabolic Panel: Recent Labs  Lab 04/28/18 1434 04/29/18 1227 04/29/18 1938 04/30/18 0545 04/30/18 1829 05/01/18 0528  NA 146* 144 146* 148*  --  147*  K 1.9* 2.3* 2.3* 2.2* 3.0* 2.8*  CL 91* 94* 99 100  --  102  CO2 28 33* 36* 33*  --  34*  GLUCOSE 134* 111* 121* 102*  --  102*  BUN 10 15 13 9   --  9  CREATININE 1.36* 1.69* 1.55* 1.40*  --  1.59*  CALCIUM 10.5* 9.7 9.4 8.9  --  9.2  MG 2.4* 2.4  --  2.4  --   --   PHOS 2.2*  --   --   --   --   --    GFR: Estimated Creatinine Clearance: 22.3 mL/min (A) (by C-G formula based on SCr of 1.59 mg/dL (H)). Liver Function Tests: Recent Labs  Lab 04/28/18 1434 04/29/18 1227  AST 92* 75*  ALT 64* 57*  ALKPHOS 82 66  BILITOT 0.7 1.2  PROT 7.4 6.8  ALBUMIN 4.8 4.1   No results for input(s): LIPASE, AMYLASE in the last 168 hours. No results for input(s): AMMONIA in the last 168 hours. Coagulation Profile: No results for input(s): INR, PROTIME in the last 168 hours. Cardiac Enzymes: No results for input(s): CKTOTAL, CKMB, CKMBINDEX, TROPONINI in the last 168 hours. BNP (last 3 results) No results for input(s): PROBNP in the last 8760 hours. HbA1C: No results for input(s): HGBA1C in the last 72 hours. CBG: No results for input(s): GLUCAP in the last 168 hours. Lipid Profile: Recent Labs    04/28/18 1434  CHOL 142  HDL 55  LDLCALC 63  TRIG 119  CHOLHDL 2.6   Thyroid Function Tests: Recent Labs    04/28/18 1434  TSH 9.990*   Anemia Panel: No results for input(s): VITAMINB12, FOLATE, FERRITIN, TIBC, IRON, RETICCTPCT in the last 72 hours. Sepsis Labs: No results for input(s): PROCALCITON, LATICACIDVEN in the last 168 hours.  No results found for this or any previous visit (from the past 240 hour(s)).       Radiology Studies: Ct Head Wo Contrast  Result Date: 04/29/2018 CLINICAL DATA:  77 year old female with multiple recent falls on  Eliquis. Struck the side of her head. Hypokalemia. EXAM: CT HEAD WITHOUT CONTRAST TECHNIQUE: Contiguous axial images were obtained from the base of the skull through the vertex without intravenous contrast. COMPARISON:  Brain MRI 03/27/2017. FINDINGS: Brain: Patchy and confluent bilateral cerebral white matter hypodensity similar to the abnormal white matter FLAIR signal in 2018. Stable cerebral volume. No midline shift, ventriculomegaly, mass effect, evidence of mass lesion, intracranial hemorrhage or evidence of cortically based acute infarction. No cortical encephalomalacia identified. Vascular: Calcified atherosclerosis at the skull base. No suspicious intracranial vascular hyperdensity. Skull: No fracture or osseous abnormality identified. Sinuses/Orbits: Visualized paranasal sinuses and mastoids are stable and well pneumatized. Other: Posterior right convexity scalp soft tissue injury with swelling and subcutaneous gas (series 3, image 57).  The underlying right calvarium appears intact. Contralateral left superior region soft tissue injury with trace gas (series 3, image 66). Underlying calvarium appears intact. Other scalp soft tissues appear normal. Visualized orbit soft tissues are within normal limits. Negative visible deep soft tissue spaces of the face. IMPRESSION: 1. Bilateral superficial soft tissue injuries with scalp lacerations and mild hematoma. No underlying skull fracture. 2. No acute intracranial abnormality. Chronic cerebral white matter disease appears stable since the MRI in 2018. Electronically Signed   By: Genevie Ann M.D.   On: 04/29/2018 13:20        Scheduled Meds: . amLODipine  10 mg Oral Daily  . donepezil  5 mg Oral Daily  . ezetimibe  10 mg Oral Daily  . levothyroxine  100 mcg Oral QAC breakfast  . memantine  10 mg Oral BID  . metoprolol tartrate  50 mg Oral BID  . rosuvastatin  40 mg Oral Daily   Continuous Infusions: . sodium chloride 50 mL/hr at 05/01/18 1030  .  potassium chloride 10 mEq (05/01/18 1034)     LOS: 1 day    Time spent: [place], MD [facility]  If 7PM-7AM, please contact night-coverage [email or web address] Password [facility] 05/01/2018, 11:34 AM

## 2018-05-01 NOTE — Progress Notes (Signed)
Patient resting comfortably during shift report. Denies complaints.  

## 2018-05-02 LAB — BASIC METABOLIC PANEL
Anion gap: 9 (ref 5–15)
BUN: 7 mg/dL — ABNORMAL LOW (ref 8–23)
CO2: 34 mmol/L — ABNORMAL HIGH (ref 22–32)
Calcium: 9.7 mg/dL (ref 8.9–10.3)
Chloride: 103 mmol/L (ref 98–111)
Creatinine, Ser: 1.42 mg/dL — ABNORMAL HIGH (ref 0.44–1.00)
GFR calc Af Amer: 40 mL/min — ABNORMAL LOW (ref >60)
GFR calc non Af Amer: 35 mL/min — ABNORMAL LOW (ref >60)
Glucose, Bld: 102 mg/dL — ABNORMAL HIGH (ref 70–99)
Potassium: 2.9 mmol/L — ABNORMAL LOW (ref 3.5–5.1)
Sodium: 146 mmol/L — ABNORMAL HIGH (ref 135–145)

## 2018-05-02 LAB — POTASSIUM: Potassium: 4.1 mmol/L (ref 3.5–5.1)

## 2018-05-02 MED ORDER — POTASSIUM CHLORIDE 10 MEQ/100ML IV SOLN
10.0000 meq | INTRAVENOUS | Status: AC
  Administered 2018-05-02 (×3): 10 meq via INTRAVENOUS
  Filled 2018-05-02 (×3): qty 100

## 2018-05-02 MED ORDER — POTASSIUM CHLORIDE CRYS ER 20 MEQ PO TBCR
40.0000 meq | EXTENDED_RELEASE_TABLET | Freq: Two times a day (BID) | ORAL | Status: DC
  Administered 2018-05-02 (×2): 40 meq via ORAL
  Filled 2018-05-02 (×2): qty 2

## 2018-05-02 MED ORDER — POTASSIUM CHLORIDE CRYS ER 20 MEQ PO TBCR
40.0000 meq | EXTENDED_RELEASE_TABLET | Freq: Two times a day (BID) | ORAL | Status: AC
  Administered 2018-05-02 (×2): 40 meq via ORAL
  Filled 2018-05-02 (×2): qty 2

## 2018-05-02 MED ORDER — SODIUM CHLORIDE 0.9 % IV SOLN
INTRAVENOUS | Status: DC | PRN
Start: 2018-05-02 — End: 2018-05-04
  Administered 2018-05-02: 12:00:00 via INTRAVENOUS

## 2018-05-02 MED ORDER — POTASSIUM CHLORIDE 10 MEQ/50ML IV SOLN: 10.0000 meq | INTRAVENOUS | Status: DC

## 2018-05-02 NOTE — Evaluation (Signed)
Occupational Therapy Evaluation Patient Details Name: Lindsey Goodman MRN: [id number] DOB: [birth year] Today's Date: 05/02/2018    History of Present Illness 77 year old with past medical history relevant for hypertension, stage III CKD, hyperlipidemia, hypothyroidism, dementia who came in for recurrent falls and was found to have profound hypokalemia and mildly elevated LFTs.   Clinical Impression   Pt admitted with above. She demonstrates the below listed deficits and will benefit from continued OT to maximize safety and independence with BADLs.  Pt presents to OT with generalized weakness, impaired cognition, and impaired balance.  She currently is mildly unsteady when standing and requires min guard assist for ADLs.  Per pt report, she lives with spouse, son, and daughter, and per chart review, this appears to be accurate.  No family present during eval, and RN reports no family has visited this weekend.   She will need 24 hour assist at discharge due to imbalance.  Will follow acutely.       Follow Up Recommendations  Home health OT;Supervision/Assistance - 24 hour    Equipment Recommendations  Tub/shower seat    Recommendations for Other Services       Precautions / Restrictions Precautions Precautions: Fall      Mobility Bed Mobility Overal bed mobility: Independent                Transfers Overall transfer level: Needs assistance   Transfers: Sit to/from Stand;Stand Pivot Transfers Sit to Stand: Supervision Stand pivot transfers: Supervision            Balance Overall balance assessment: Mild deficits observed, not formally tested                                         ADL either performed or assessed with clinical judgement   ADL Overall ADL's : Needs assistance/impaired Eating/Feeding: Independent   Grooming: Wash/dry hands;Wash/dry face;Oral care;Brushing hair;Min guard;Standing   Upper Body Bathing: Supervision/ safety;Sitting    Lower Body Bathing: Min guard;Sit to/from stand   Upper Body Dressing : Set up;Sitting   Lower Body Dressing: Min guard;Sit to/from stand   Toilet Transfer: Min guard;Ambulation;Comfort height toilet;Grab bars   Toileting- Clothing Manipulation and Hygiene: Min guard;Sit to/from stand       Functional mobility during ADLs: Min guard General ADL Comments: pt mildly unsteady      Vision         Perception     Praxis      Pertinent Vitals/Pain Pain Assessment: No/denies pain     Hand Dominance Right   Extremity/Trunk Assessment Upper Extremity Assessment Upper Extremity Assessment: Overall WFL for tasks assessed   Lower Extremity Assessment Lower Extremity Assessment: Defer to PT evaluation   Cervical / Trunk Assessment Cervical / Trunk Assessment: Normal   Communication Communication Communication: No difficulties   Cognition Arousal/Alertness: Awake/alert Behavior During Therapy: Impulsive;Flat affect Overall Cognitive Status: No family/caregiver present to determine baseline cognitive functioning                                 General Comments: Pt with h/o dementia.  No family present to confirm that she is at baseline.    General Comments  Spoke with RN, who reports that family has not visited or called all weekend.   Unsure of home situation     Exercises  Shoulder Instructions      Home Living Family/patient expects to be discharged to:: Private residence Living Arrangements: Spouse/significant other;Children Available Help at Discharge: Family;Available 24 hours/day Type of Home: House Home Access: Level entry     Home Layout: Two level(4 level home)   Alternate Level Stairs-Rails: Right;Left Bathroom Shower/Tub: Tub/shower unit;Walk-in shower   Bathroom Toilet: Standard Bathroom Accessibility: Yes   Home Equipment: Shower seat   Additional Comments: Info provided by pt as no family available to confirm info        Prior Functioning/Environment          Comments: Pt indicates she is independent.  Per neurology office visits from first of the year, pt has stopped driving, is no longer cooking, and is fairly sedentary         OT Problem List: Decreased activity tolerance;Impaired balance (sitting and/or standing);Decreased coordination;Decreased safety awareness;Decreased knowledge of use of DME or AE      OT Treatment/Interventions: Self-care/ADL training;DME and/or AE instruction;Therapeutic activities;Cognitive remediation/compensation;Patient/family education;Balance training    OT Goals(Current goals can be found in the care plan section) Acute Rehab OT Goals Patient Stated Goal: Pt does voice desire to return home.  But given that she has poor awareness of deficits, difficult to engage her in goal setting  OT Goal Formulation: Patient unable to participate in goal setting Time For Goal Achievement: 05/16/18 Potential to Achieve Goals: Good ADL Goals Pt Will Perform Grooming: with modified independence;standing Pt Will Perform Upper Body Bathing: with modified independence;sitting Pt Will Perform Lower Body Bathing: with modified independence;sit to/from stand Pt Will Perform Upper Body Dressing: with modified independence;sitting Pt Will Perform Lower Body Dressing: with modified independence;sit to/from stand Pt Will Transfer to Toilet: with modified independence;ambulating;regular height toilet;grab bars Pt Will Perform Toileting - Clothing Manipulation and hygiene: with modified independence;sit to/from stand Pt Will Perform Tub/Shower Transfer: Shower transfer;with supervision;grab bars  OT Frequency: Min 2X/week   Barriers to D/C: Decreased caregiver support  unsure that family is able to provide necessary care at discharge        Co-evaluation              AM-PAC PT "6 Clicks" Daily Activity     Outcome Measure Help from another person eating meals?: None Help from  another person taking care of personal grooming?: None Help from another person toileting, which includes using toliet, bedpan, or urinal?: None Help from another person bathing (including washing, rinsing, drying)?: A Little Help from another person to put on and taking off regular upper body clothing?: None Help from another person to put on and taking off regular lower body clothing?: A Little 6 Click Score: 22   End of Session Nurse Communication: Mobility status  Activity Tolerance: Patient tolerated treatment well Patient left: in chair;with call bell/phone within reach;with chair alarm set  OT Visit Diagnosis: Unsteadiness on feet (R26.81);Cognitive communication deficit (R41.841)                Time: 1740-8144 OT Time Calculation (min): 18 min Charges:  OT General Charges $OT Visit: 1 Visit OT Evaluation $OT Eval Moderate Complexity: 1 Mod  [place], OTR/L [phone]   Lucille Passy M 05/02/2018, 10:42 AM

## 2018-05-02 NOTE — Progress Notes (Signed)
Patient sleeping during shift report.      

## 2018-05-02 NOTE — Progress Notes (Signed)
PROGRESS NOTE    Lindsey Goodman  YKD:[id number] DOB: 1941-01-05 DOA: 04/29/2018 PCP: Troy Sine, MD    Brief Narrative:  77 year old with past medical history relevant for hypertension, stage III CKD, hyperlipidemia, hypothyroidism, dementia who came in for recurrent falls and was found to have profound hypokalemia and mildly elevated LFTs.   Assessment & Plan:   Principal Problem:   Acute renal failure superimposed on stage 3 chronic kidney disease (HCC) Active Problems:   Essential hypertension   Hypothyroidism   Hyperlipidemia   Dementia   Hypokalemia   Fall   Abnormal LFTs   #) Profound hypokalemia: Her serum osms are much greater than her urine osms.  She is not hyponatremic.  Her sodium continues to be quite low.  Her urine potassium is approximately 18 with a urine sodium of greater than 40.  Interestingly enough her specific gravity is quite low as well.  Her pH is 9.0.  The likelihood of a primary aldosterone disorder is increasing.  She additionally is not concentrating her urine appropriately which is likely secondary to the hypo-kalemia. -Aggressive repletion  -Urine creatinine pending -Renal vascular ultrasound on 05/01/2018 shows multiple renal cysts and mild stenosis of the left renal artery -MRI abdomen with contrast pending -We will consider urine diuretic screen if there is concern for surreptitious diuretic intake -  plasma aldosterone concentration and plasma renin activity pending -Telemetry  #) Fall: Likely secondary to muscle weakness from hypokalemia.   -PT consult recommends home health PT  #) Hypertension: -Continue amlodipine 5 mg daily -Hold on losartan 40 mg daily - Continue metoprolol tartrate 50 mg twice daily  #) Abnormal LFTs: Very mild hepatocellular pattern of liver injury.  This was not present approximately 6 months ago. - Monitor  #) Hypothyroidism: -Continue levothyroxine 100 mg daily  #) Stage III CKD: Stable  #)  Dementia: -Continue memantine 10 mg twice daily -Continue donepezil 5 mg daily  #) Hyperlipidemia: - Continue Zetia 10 mg daily -Continue rosuvastatin 40 mg daily, no reason to hold in the setting of mild LFT elevation  Fluids: Gentle IV fluids Elect lites: Per above Nutrition: Regular diet  Prophylaxis: Enoxaparin  Disposition: Pending normalization of potassium  Full code    Consultants:   None  Procedures:   None  Antimicrobials:   None   Subjective: Patient reports she is doing fairly well.  She denies any symptoms.  She denies any nausea, vomiting, diarrhea.  There is some concern that she keeps insisting that she talk to her family however there does not appear to be any evidence that she did call them.  Objective: Vitals:   05/01/18 1214 05/01/18 1936 05/01/18 2100 05/02/18 0500  BP: (!) 118/57 (!) 102/50 130/60 (!) 147/67  Pulse: (!) 53 (!) 59 67 (!) 50  Resp: 20 16  18   Temp: 97.6 F (36.4 C) 97.7 F (36.5 C)  97.7 F (36.5 C)  TempSrc: Oral Oral  Oral  SpO2: 99% 100%  100%  Weight:    46.9 kg (103 lb 4.8 oz)  Height:        Intake/Output Summary (Last 24 hours) at 05/02/2018 0934 Last data filed at 05/02/2018 0834 Gross per 24 hour  Intake 1784.72 ml  Output 1350 ml  Net 434.72 ml   Filed Weights   04/30/18 0325 05/01/18 0438 05/02/18 0500  Weight: 46.1 kg (101 lb 11.2 oz) 46.9 kg (103 lb 4.8 oz) 46.9 kg (103 lb 4.8 oz)    Examination:  General exam: Appears calm and comfortable  Respiratory system: Clear to auscultation. Respiratory effort normal. Cardiovascular system: Regular rate and rhythm, no murmurs Gastrointestinal system: Soft, nondistended, no rebound or guarding, plus bowel sounds Central nervous system: Alert and oriented. No focal neurological deficits. Extremities: No lower extremity edema Skin: Senile purpura, contusions on left upper extremity Psychiatry: Unable to assess due to underlying dementia    Data Reviewed:  I have personally reviewed following labs and imaging studies  CBC: Recent Labs  Lab 04/28/18 1434 04/29/18 1227 04/30/18 0545 05/01/18 0528  WBC 7.1 6.1 6.2 6.7  NEUTROABS 4.8 2.9  --   --   HGB 15.0 14.2 11.9* 11.8*  HCT 44.5 41.4 35.5* 35.3*  MCV 89 88.8 92.0 92.2  PLT 190 116* 148* 419   Basic Metabolic Panel: Recent Labs  Lab 04/28/18 1434 04/29/18 1227 04/29/18 1938 04/30/18 0545 04/30/18 1829 05/01/18 0528 05/01/18 1906  NA 146* 144 146* 148*  --  147*  --   K 1.9* 2.3* 2.3* 2.2* 3.0* 2.8* 3.1*  CL 91* 94* 99 100  --  102  --   CO2 28 33* 36* 33*  --  34*  --   GLUCOSE 134* 111* 121* 102*  --  102*  --   BUN 10 15 13 9   --  9  --   CREATININE 1.36* 1.69* 1.55* 1.40*  --  1.59*  --   CALCIUM 10.5* 9.7 9.4 8.9  --  9.2  --   MG 2.4* 2.4  --  2.4  --   --   --   PHOS 2.2*  --   --   --   --   --   --    GFR: Estimated Creatinine Clearance: 22.3 mL/min (A) (by C-G formula based on SCr of 1.59 mg/dL (H)). Liver Function Tests: Recent Labs  Lab 04/28/18 1434 04/29/18 1227  AST 92* 75*  ALT 64* 57*  ALKPHOS 82 66  BILITOT 0.7 1.2  PROT 7.4 6.8  ALBUMIN 4.8 4.1   No results for input(s): LIPASE, AMYLASE in the last 168 hours. No results for input(s): AMMONIA in the last 168 hours. Coagulation Profile: No results for input(s): INR, PROTIME in the last 168 hours. Cardiac Enzymes: No results for input(s): CKTOTAL, CKMB, CKMBINDEX, TROPONINI in the last 168 hours. BNP (last 3 results) No results for input(s): PROBNP in the last 8760 hours. HbA1C: No results for input(s): HGBA1C in the last 72 hours. CBG: No results for input(s): GLUCAP in the last 168 hours. Lipid Profile: No results for input(s): CHOL, HDL, LDLCALC, TRIG, CHOLHDL, LDLDIRECT in the last 72 hours. Thyroid Function Tests: No results for input(s): TSH, T4TOTAL, FREET4, T3FREE, THYROIDAB in the last 72 hours. Anemia Panel: No results for input(s): VITAMINB12, FOLATE, FERRITIN, TIBC, IRON,  RETICCTPCT in the last 72 hours. Sepsis Labs: No results for input(s): PROCALCITON, LATICACIDVEN in the last 168 hours.  No results found for this or any previous visit (from the past 240 hour(s)).       Radiology Studies: No results found.      Scheduled Meds: . amLODipine  10 mg Oral Daily  . donepezil  5 mg Oral Daily  . ezetimibe  10 mg Oral Daily  . levothyroxine  100 mcg Oral QAC breakfast  . memantine  10 mg Oral BID  . metoprolol tartrate  50 mg Oral BID  . potassium chloride  40 mEq Oral BID  . rosuvastatin  40 mg Oral Daily  Continuous Infusions: . sodium chloride Stopped (05/01/18 1840)     LOS: 2 days    Time spent: 72    Cristy Folks, MD [facility]  If 7PM-7AM, please contact night-coverage [email or web address] Password [facility] 05/02/2018, 9:34 AM

## 2018-05-02 NOTE — Progress Notes (Signed)
MD notified of family contact info. Informed that pt does not have 24 hour care at home; daughter Con Memos) states that she thinks it might be time for SNF.  Daughter Cell: [phone] Husband Cell: [phone]

## 2018-05-02 NOTE — Progress Notes (Signed)
Lab calls noting that the specimen drawn for BMP was unable to be processed due to low specimen volume. Requested this order be re-entered stat to not further delay patient's treatment, as she was admitted for hypokalemia.

## 2018-05-02 NOTE — Progress Notes (Signed)
Page to S.Purohit,MD notified of HR 55, held Metoprolol as well as low K at 2.9. Expecting potassium orders, per verbal mention on the call.

## 2018-05-03 ENCOUNTER — Inpatient Hospital Stay (HOSPITAL_COMMUNITY): Payer: PPO

## 2018-05-03 ENCOUNTER — Ambulatory Visit: Payer: PPO | Admitting: Nurse Practitioner

## 2018-05-03 LAB — COMPREHENSIVE METABOLIC PANEL
ALT: 96 U/L — ABNORMAL HIGH (ref 0–44)
AST: 127 U/L — ABNORMAL HIGH (ref 15–41)
Albumin: 3.1 g/dL — ABNORMAL LOW (ref 3.5–5.0)
Alkaline Phosphatase: 64 U/L (ref 38–126)
Anion gap: 8 (ref 5–15)
BUN: 12 mg/dL (ref 8–23)
CO2: 30 mmol/L (ref 22–32)
Calcium: 9.6 mg/dL (ref 8.9–10.3)
Chloride: 109 mmol/L (ref 98–111)
Creatinine, Ser: 1.75 mg/dL — ABNORMAL HIGH (ref 0.44–1.00)
GFR calc Af Amer: 31 mL/min — ABNORMAL LOW (ref >60)
GFR calc non Af Amer: 27 mL/min — ABNORMAL LOW (ref >60)
Glucose, Bld: 100 mg/dL — ABNORMAL HIGH (ref 70–99)
Potassium: 4.5 mmol/L (ref 3.5–5.1)
Sodium: 147 mmol/L — ABNORMAL HIGH (ref 135–145)
Total Bilirubin: 0.7 mg/dL (ref 0.3–1.2)
Total Protein: 5.5 g/dL — ABNORMAL LOW (ref 6.5–8.1)

## 2018-05-03 LAB — MAGNESIUM: Magnesium: 2.3 mg/dL (ref 1.7–2.4)

## 2018-05-03 LAB — CBC
HCT: 36.2 % (ref 36.0–46.0)
Hemoglobin: 11.9 g/dL — ABNORMAL LOW (ref 12.0–15.0)
MCH: 30.8 pg (ref 26.0–34.0)
MCHC: 32.9 g/dL (ref 30.0–36.0)
MCV: 93.8 fL (ref 78.0–100.0)
Platelets: 185 10*3/uL (ref 150–400)
RBC: 3.86 MIL/uL — ABNORMAL LOW (ref 3.87–5.11)
RDW: 13.5 % (ref 11.5–15.5)
WBC: 6.8 10*3/uL (ref 4.0–10.5)

## 2018-05-03 NOTE — Consult Note (Signed)
            [facility] CM Primary Care Navigator  05/03/2018  Lindsey Goodman 10/23/1940 [id number]     Went to seepatient at the bedside toidentify possible discharge needs.  Patient reports that she fell and hit her head which resultedto thisadmission. (recurrent falls; was found to have profound hypokalemia and mildly elevated LFTs - liver function test)  Patientendorses Dr. Shelva Majestic with [facility] as herprimary care provider.She declined offer of having her own primary care provider and stating that she is used to Dr. Shelva Majestic since she had him for long years now.    [facility] [address] to obtain medicationswithout difficulty.  Patient has been managinghermedicationsat home with use of "pill box" system filledonce aweek.  Patient's husband Alvester Chou) have beenprovidingtransportation to herdoctors' appointments.  Her husband, son Sherren Mocha) and daughter Clerance Lav with her at home and will be her primary caregivers per patient.  Anticipated plan for dischargeis home with her family. PT consult recommends home health PT but patient refused home health services.  Patient voiced understandingto callprimarycare provider'soffice whenshereturnshome,for a post discharge follow-upvisitwithin1- 2 weeksor sooner if needs arise.Patient letter (with PCP's contact number) was provided asherreminder.   Discussed with patientregarding [facility] CM services available for health managementandresourcesat homebutshe denies any needs or concernsat this point. Patient verbalizedunderstandingof needto seekreferral from primary care provider to [facility] care management ifdeemed necessary and appropriatefor anyservicesin thefuture.  [facility] care management information was provided for futureneedsthat she may have.  Patienthowever,verbally agreedand optedforEMMIcalls tofollow-up withherrecoveryat  home.   Referral made for [facility] General calls after discharge.   For additional questions please contact:  Edwena Felty A. Annalisa Colonna, BSN, RN-BC [facility] PRIMARY CARE Navigator Cell: [phone]

## 2018-05-03 NOTE — Progress Notes (Signed)
PROGRESS NOTE    Lindsey Goodman  IOX:[id number] DOB: 05-26-1941 DOA: 04/29/2018 PCP: Troy Sine, MD    Brief Narrative:  77 year old with past medical history relevant for hypertension, stage III CKD, hyperlipidemia, hypothyroidism, dementia who came in for recurrent falls and was found to have profound hypokalemia and mildly elevated LFTs.   Assessment & Plan:   Principal Problem:   Acute renal failure superimposed on stage 3 chronic kidney disease (HCC) Active Problems:   Essential hypertension   Hypothyroidism   Hyperlipidemia   Dementia   Hypokalemia   Fall   Abnormal LFTs   #) Profound hypokalemia: Resolved after significant repletion.  Patient metabolic alkalosis is resolved as well.  Unclear why patient has such profound potassium wasting.  Her urine potassium to creatinine ratio is pending.  She is not on any medications at home that would cause such profound hypokalemia.  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 -Urine creatinine pending, urine potassium 18, urine sodium greater than 40, urine pH is quite alkalotic, urine is quite dilute, urine awesome's are low and serum calcium is elevated. -Renal vascular ultrasound on 05/01/2018 shows multiple renal cysts and mild stenosis of the left renal artery -MRI abdomen with contrast pending -  plasma aldosterone concentration and plasma renin activity pending -Telemetry  #) Fall: Likely secondary to muscle weakness from hypokalemia.   -PT consult recommends home health PT  #) Hypertension: -Continue amlodipine 5 mg daily -Hold on losartan 40 mg daily - Continue metoprolol tartrate 50 mg twice daily  #) Abnormal LFTs: Very mild hepatocellular pattern of liver injury.  This was not present approximately 6 months ago. - Acute hepatitis panel negative -MRI abdomen pending  #) Hypothyroidism: -Continue levothyroxine 100 mg daily  #)  Stage III CKD: Stable  #) Dementia: -Continue memantine 10 mg twice daily -Continue donepezil 5 mg daily  #) Hyperlipidemia: - Continue Zetia 10 mg daily -Continue rosuvastatin 40 mg daily, no reason to hold in the setting of mild LFT elevation  Fluids: Gentle IV fluids Elect lites: Per above Nutrition: Regular diet  Prophylaxis: Enoxaparin  Disposition: Pending normalization of potassium  Full code    Consultants:   None  Procedures:   None  Antimicrobials:   None   Subjective: Patient reports she is doing fairly well.  She is eager to go home.  She denies any nausea, vomiting, diarrhea, cough, congestion, rhinorrhea.  Objective: Vitals:   05/02/18 2224 05/03/18 0400 05/03/18 0457 05/03/18 0755  BP:   121/69 (!) 146/80  Pulse: 80  (!) 55 61  Resp:   18   Temp:   98.7 F (37.1 C)   TempSrc:   Oral   SpO2:   98%   Weight:  47.2 kg (104 lb)    Height:        Intake/Output Summary (Last 24 hours) at 05/03/2018 0953 Last data filed at 05/03/2018 0837 Gross per 24 hour  Intake 1034.8 ml  Output 1150 ml  Net -115.2 ml   Filed Weights   05/01/18 0438 05/02/18 0500 05/03/18 0400  Weight: 46.9 kg (103 lb 4.8 oz) 46.9 kg (103 lb 4.8 oz) 47.2 kg (104 lb)    Examination:  General exam: Appears calm and comfortable  Respiratory system: Clear to auscultation. Respiratory effort normal. Cardiovascular system: Regular rate and rhythm, no murmurs Gastrointestinal system: Soft, nondistended, no rebound or guarding, plus bowel sounds Central nervous  system: Alert and oriented. No focal neurological deficits. Extremities: No lower extremity edema Skin: Senile purpura, contusions on left upper extremity Psychiatry: Unable to assess due to underlying dementia    Data Reviewed: I have personally reviewed following labs and imaging studies  CBC: Recent Labs  Lab 04/28/18 1434 04/29/18 1227 04/30/18 0545 05/01/18 0528 05/03/18 0619  WBC 7.1 6.1 6.2 6.7 6.8    NEUTROABS 4.8 2.9  --   --   --   HGB 15.0 14.2 11.9* 11.8* 11.9*  HCT 44.5 41.4 35.5* 35.3* 36.2  MCV 89 88.8 92.0 92.2 93.8  PLT 190 116* 148* 170 355   Basic Metabolic Panel: Recent Labs  Lab 04/28/18 1434  04/29/18 1227 04/29/18 1938 04/30/18 0545  05/01/18 0528 05/01/18 1906 05/02/18 0957 05/02/18 1640 05/03/18 0619  NA 146*   < > 144 146* 148*  --  147*  --  146*  --  147*  K 1.9*  --  2.3* 2.3* 2.2*   < > 2.8* 3.1* 2.9* 4.1 4.5  CL 91*  --  94* 99 100  --  102  --  103  --  109  CO2 28  --  33* 36* 33*  --  34*  --  34*  --  30  GLUCOSE 134*   < > 111* 121* 102*  --  102*  --  102*  --  100*  BUN 10   < > 15 13 9   --  9  --  7*  --  12  CREATININE 1.36*  --  1.69* 1.55* 1.40*  --  1.59*  --  1.42*  --  1.75*  CALCIUM 10.5*  --  9.7 9.4 8.9  --  9.2  --  9.7  --  9.6  MG 2.4*  --  2.4  --  2.4  --   --   --   --   --  2.3  PHOS 2.2*  --   --   --   --   --   --   --   --   --   --    < > = values in this interval not displayed.   GFR: Estimated Creatinine Clearance: 20.4 mL/min (A) (by C-G formula based on SCr of 1.75 mg/dL (H)). Liver Function Tests: Recent Labs  Lab 04/28/18 1434 04/29/18 1227 05/03/18 0619  AST 92* 75* 127*  ALT 64* 57* 96*  ALKPHOS 82 66 64  BILITOT 0.7 1.2 0.7  PROT 7.4 6.8 5.5*  ALBUMIN 4.8 4.1 3.1*   No results for input(s): LIPASE, AMYLASE in the last 168 hours. No results for input(s): AMMONIA in the last 168 hours. Coagulation Profile: No results for input(s): INR, PROTIME in the last 168 hours. Cardiac Enzymes: No results for input(s): CKTOTAL, CKMB, CKMBINDEX, TROPONINI in the last 168 hours. BNP (last 3 results) No results for input(s): PROBNP in the last 8760 hours. HbA1C: No results for input(s): HGBA1C in the last 72 hours. CBG: No results for input(s): GLUCAP in the last 168 hours. Lipid Profile: No results for input(s): CHOL, HDL, LDLCALC, TRIG, CHOLHDL, LDLDIRECT in the last 72 hours. Thyroid Function Tests: No  results for input(s): TSH, T4TOTAL, FREET4, T3FREE, THYROIDAB in the last 72 hours. Anemia Panel: No results for input(s): VITAMINB12, FOLATE, FERRITIN, TIBC, IRON, RETICCTPCT in the last 72 hours. Sepsis Labs: No results for input(s): PROCALCITON, LATICACIDVEN in the last 168 hours.  No results found for this or any previous  visit (from the past 240 hour(s)).       Radiology Studies: No results found.      Scheduled Meds: . amLODipine  10 mg Oral Daily  . donepezil  5 mg Oral Daily  . ezetimibe  10 mg Oral Daily  . levothyroxine  100 mcg Oral QAC breakfast  . memantine  10 mg Oral BID  . metoprolol tartrate  50 mg Oral BID  . rosuvastatin  40 mg Oral Daily   Continuous Infusions: . sodium chloride 20 mL/hr at 05/02/18 1700     LOS: 3 days    Time spent: Dobson, MD [facility]  If 7PM-7AM, please contact night-coverage [email or web address] Password [facility] 05/03/2018, 9:53 AM

## 2018-05-03 NOTE — Care Management Note (Signed)
Case Management Note  Patient Details  Name: Lindsey Goodman MRN: [id number] Date of Birth: Feb 21, 1941  Subjective/Objective:   Acute Renal Failure                Action/Plan: Patient lives at home alone;PCP: Troy Sine, MD; has private insurance with [employer] with prescription drug coverage; pharmacy of choice [facility]; patient reports no problem getting her medication; No problem with transportation, she stated that her family helps her with errands. She is refusing all [place] services at this time, CM informed her that if she changed her mind and wanted [place] after discharge, her PCP can make arrangements from the office.   Expected Discharge Date:    possibly 05/06/2018              Expected Discharge Plan:  [place]  Discharge planning Services  CM Consult  Choice offered to:  Patient  HH Arranged:  Patient Refused HH  Status of Service:  In process, will continue to follow  Sherrilyn Rist [phone] 05/03/2018, 12:29 PM

## 2018-05-03 NOTE — Progress Notes (Signed)
Physical Therapy Treatment Patient Details Name: Lindsey Goodman MRN: [id number] DOB: 05-30-1941 Today's Date: 05/03/2018    History of Present Illness 77 year old with past medical history relevant for hypertension, stage III CKD, hyperlipidemia, hypothyroidism, dementia who came in for recurrent falls and was found to have profound hypokalemia and mildly elevated LFTs.    PT Comments    Pt remains to be confused about situation. No family present to determine baseline. Pt con't to be impulsive with decreased insight to deficits. Pt with increased falls risk as indicated by DGI. Pt requires 24/7 assist for safe d/c home. Acute PT to cont to follow.    Follow Up Recommendations  Home health PT;Supervision/Assistance - 24 hour     Equipment Recommendations  None recommended by PT    Recommendations for Other Services       Precautions / Restrictions Precautions Precautions: Fall Restrictions Weight Bearing Restrictions: No    Mobility  Bed Mobility Overal bed mobility: Independent             General bed mobility comments: hob elevated, didn't use bed rail or require physical assist  Transfers Overall transfer level: Needs assistance Equipment used: None Transfers: Sit to/from Stand Sit to Stand: Min guard         General transfer comment: min guard for safety due to impulsivity, mildly unsteady upon standing, was staggering  Ambulation/Gait Ambulation/Gait assistance: Min assist Gait Distance (Feet): 350 Feet Assistive device: None Gait Pattern/deviations: Staggering left;Staggering right Gait velocity: decrease   General Gait Details: pt requiring minA occasionally to prevent fall due to staggering back and forth L/R and occasional near cross-over gait. pt declining AD   Stairs Stairs: Yes Stairs assistance: Min guard Stair Management: One rail Right Number of Stairs: 8 General stair comments: alternating pattern on stairs, min guard for safety. pt  advised not to perform stairs without family help   Wheelchair Mobility    Modified Rankin (Stroke Patients Only)       Balance                                 Standardized Balance Assessment Standardized Balance Assessment : Dynamic Gait Index   Dynamic Gait Index Level Surface: Mild Impairment Change in Gait Speed: Mild Impairment Gait with Horizontal Head Turns: Mild Impairment Gait with Vertical Head Turns: Mild Impairment Gait and Pivot Turn: Mild Impairment Step Over Obstacle: Mild Impairment Step Around Obstacles: Mild Impairment Steps: Mild Impairment Total Score: 16      Cognition Arousal/Alertness: Awake/alert Behavior During Therapy: Impulsive;Flat affect Overall Cognitive Status: No family/caregiver present to determine baseline cognitive functioning                                 General Comments: pt with h/o dementia      Exercises      General Comments General comments (skin integrity, edema, etc.): assisted pt to the bathroom, pt indep with hygiene using rail and washed hands without cues or assist      Pertinent Vitals/Pain Pain Assessment: No/denies pain    Home Living                      Prior Function            PT Goals (current goals can now be found in the care plan section) Progress towards  PT goals: Progressing toward goals    Frequency    Min 3X/week      PT Plan Current plan remains appropriate    Co-evaluation              AM-PAC PT "6 Clicks" Daily Activity  Outcome Measure  Difficulty turning over in bed (including adjusting bedclothes, sheets and blankets)?: None Difficulty moving from lying on back to sitting on the side of the bed? : None Difficulty sitting down on and standing up from a chair with arms (e.g., wheelchair, bedside commode, etc,.)?: None Help needed moving to and from a bed to chair (including a wheelchair)?: A Little Help needed walking in hospital  room?: A Little Help needed climbing 3-5 steps with a railing? : A Little 6 Click Score: 21    End of Session Equipment Utilized During Treatment: Gait belt Activity Tolerance: Patient tolerated treatment well Patient left: in bed;with call bell/phone within reach;with nursing/sitter in room Nurse Communication: Mobility status PT Visit Diagnosis: Unsteadiness on feet (R26.81);Muscle weakness (generalized) (M62.81)     Time: 3976-7341 PT Time Calculation (min) (ACUTE ONLY): 10 min  Charges:  $Gait Training: 8-22 mins                     Kittie Plater, PT, DPT Pager #: [phone] Office #: [phone]    [place] 05/03/2018, 2:01 PM

## 2018-05-04 ENCOUNTER — Other Ambulatory Visit: Payer: Self-pay | Admitting: Cardiovascular Disease

## 2018-05-04 LAB — BASIC METABOLIC PANEL
Anion gap: 7 (ref 5–15)
BUN: 12 mg/dL (ref 8–23)
CO2: 28 mmol/L (ref 22–32)
Calcium: 9.3 mg/dL (ref 8.9–10.3)
Chloride: 109 mmol/L (ref 98–111)
Creatinine, Ser: 1.52 mg/dL — ABNORMAL HIGH (ref 0.44–1.00)
GFR calc Af Amer: 37 mL/min — ABNORMAL LOW (ref >60)
GFR calc non Af Amer: 32 mL/min — ABNORMAL LOW (ref >60)
Glucose, Bld: 90 mg/dL (ref 70–99)
Potassium: 4.1 mmol/L (ref 3.5–5.1)
Sodium: 144 mmol/L (ref 135–145)

## 2018-05-04 LAB — ALDOSTERONE + RENIN ACTIVITY W/ RATIO
ALDO / PRA Ratio: UNDETERMINED
Aldosterone: <1 ng/dL (ref 0.0–30.0)
PRA LC/MS/MS: <0.167 ng/mL/hr — ABNORMAL LOW (ref 0.167–5.380)

## 2018-05-04 NOTE — Progress Notes (Signed)
Patient's family notified patient has been discharged via leaving a voicemail message twice without anybody calling back.  Nurse will keep trying to reach family. Marcille Blanco, RN

## 2018-05-04 NOTE — Progress Notes (Addendum)
12:12 Spoke w patient at bedside. She states that she does not live at home alone. She states that she lives at home with her spouse, daughter, and son who are able to provide 24 hour supervision. Patient does have spouse and daughter listed in contacts. With her permission I left message with daughter requesting callback to plan DC care. Patient continues to refuse [facility] services at this time.   14:15 LM w spouse at [phone]. [phone] does not call daughter- please do not use)

## 2018-05-04 NOTE — Discharge Instructions (Signed)

## 2018-05-04 NOTE — Discharge Summary (Signed)
Physician Discharge Summary  KENNETH LAX XAJ:[id number] DOB: [birth year] DOA: 04/29/2018  PCP: Troy Sine, MD  Admit date: 04/29/2018 Discharge date: 05/04/2018  Admitted From: Home Disposition: Home  Recommendations for Outpatient Follow-up:  1. Follow up with PCP in 1-2 weeks 2. Please obtain CMP/CBC in 2 to 3 days with primary care doctor 3. MRI of the abdomen in 6 months from today's date for splenic lesions   Home Health: No, patient refused Equipment/Devices: None  Discharge Condition: Stable CODE STATUS: Full Diet recommendation: Heart healthy  Brief/Interim Summary:  #) Profound hypokalemia: Patient was admitted with severe hypokalemia and metabolic alkalosis.  EKG did not show any U waves.  She was given aggressive repletion but continues to have a dropping potassium.  A urine potassium to creatinine ratio is pending.  Urine potassium was 18 which suggested was inappropriately low.  Urine sodium was greater than 40 suggesting that she was adequately hydrated.  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  Urine osmolality was low again secondary to diabetes insipidus.  Her serum osmolality was mildly elevated.  A renal ultrasound on 05/01/2018 showed multiple renal cysts and mild stenosis of the left renal artery.  MRI abdomen without contrast showed that the cyst were likely simple cysts however did show splenic lesions that recommended a repeat MRI in 6 months to document stability.  #) Fall: This was thought to be secondary to hypokalemia.  Physical therapy evaluated the patient recommended home health PT.  #) Hypertension: Patient was continued on amlodipine.  ARB was held.  This can be restarted on discharge.  Patient's metoprolol tartrate was continued.  #) Abnormalities: Patient very mild hepatocellular pattern of liver injury.  Imaging done with the MRI of the abdomen was unremarkable.  Acute  hepatitis panel was negative.  Should be rechecked as an outpatient.  #) Hypo thyroidism: Patient's was continued on home levothyroxine.  #) Stage III CKD: Stable  #) Dementia: Patient was continued on home amantadine and donepezil.  #) Hyperlipidemia: Patient was continued on home Zecher and rosuvastatin.  Discharge Diagnoses:  Principal Problem:   Acute renal failure superimposed on stage 3 chronic kidney disease (HCC) Active Problems:   Essential hypertension   Hypothyroidism   Hyperlipidemia   Dementia   Hypokalemia   Fall   Abnormal LFTs    Discharge Instructions  Discharge Instructions    Call MD for:  difficulty breathing, headache or visual disturbances   Complete by:  As directed    Call MD for:  hives   Complete by:  As directed    Call MD for:  persistant dizziness or light-headedness   Complete by:  As directed    Call MD for:  persistant nausea and vomiting   Complete by:  As directed    Call MD for:  redness, tenderness, or signs of infection (pain, swelling, redness, odor or green/yellow discharge around incision site)   Complete by:  As directed    Call MD for:  severe uncontrolled pain   Complete by:  As directed    Call MD for:  temperature >100.4   Complete by:  As directed    Diet - low sodium heart healthy   Complete by:  As directed    Discharge instructions   Complete by:  As directed    Please follow-up with your primary care doctor this week to have your potassium and liver tests rechecked.   Increase  activity slowly   Complete by:  As directed      Allergies as of 05/04/2018      Reactions   Atorvastatin Other (See Comments)   Pt states she felt confused.   Amlodipine Besylate Swelling   Takes at home   Prednisone    No appetite      Medication List    TAKE these medications   amLODipine 5 MG tablet Commonly known as:  NORVASC Take 1 tablet (5 mg total) by mouth daily. Keep OV.   donepezil 5 MG tablet Commonly known as:   ARICEPT TAKE 1 TABLET BY MOUTH EVERY DAY   ezetimibe 10 MG tablet Commonly known as:  ZETIA Take 1 tablet (10 mg total) by mouth daily.   levothyroxine 75 MCG tablet Commonly known as:  SYNTHROID, LEVOTHROID TAKE 1 TABLET (75 MCG TOTAL) BY MOUTH DAILY BEFORE BREAKFAST.   memantine 10 MG tablet Commonly known as:  NAMENDA TAKE 1 TABLET BY MOUTH TWICE A DAY   metoprolol tartrate 50 MG tablet Commonly known as:  LOPRESSOR Take 1 tablet (50 mg total) by mouth 2 (two) times daily.   olmesartan 40 MG tablet Commonly known as:  BENICAR TAKE 1 TABLET BY MOUTH EVERY DAY   rosuvastatin 40 MG tablet Commonly known as:  CRESTOR Take 1 tablet (40 mg total) by mouth daily. Keep OV       Allergies  Allergen Reactions  . Atorvastatin Other (See Comments)    Pt states she felt confused.  . Amlodipine Besylate Swelling    Takes at home  . Prednisone     No appetite    Consultations:  None   Procedures/Studies: Ct Head Wo Contrast  Result Date: 04/29/2018 CLINICAL DATA:  77 year old female with multiple recent falls on Eliquis. Struck the side of her head. Hypokalemia. EXAM: CT HEAD WITHOUT CONTRAST TECHNIQUE: Contiguous axial images were obtained from the base of the skull through the vertex without intravenous contrast. COMPARISON:  Brain MRI 03/27/2017. FINDINGS: Brain: Patchy and confluent bilateral cerebral white matter hypodensity similar to the abnormal white matter FLAIR signal in 2018. Stable cerebral volume. No midline shift, ventriculomegaly, mass effect, evidence of mass lesion, intracranial hemorrhage or evidence of cortically based acute infarction. No cortical encephalomalacia identified. Vascular: Calcified atherosclerosis at the skull base. No suspicious intracranial vascular hyperdensity. Skull: No fracture or osseous abnormality identified. Sinuses/Orbits: Visualized paranasal sinuses and mastoids are stable and well pneumatized. Other: Posterior right convexity scalp  soft tissue injury with swelling and subcutaneous gas (series 3, image 57). The underlying right calvarium appears intact. Contralateral left superior region soft tissue injury with trace gas (series 3, image 66). Underlying calvarium appears intact. Other scalp soft tissues appear normal. Visualized orbit soft tissues are within normal limits. Negative visible deep soft tissue spaces of the face. IMPRESSION: 1. Bilateral superficial soft tissue injuries with scalp lacerations and mild hematoma. No underlying skull fracture. 2. No acute intracranial abnormality. Chronic cerebral white matter disease appears stable since the MRI in 2018. Electronically Signed   By: Genevie Ann M.D.   On: 04/29/2018 13:20   Mr Abdomen Wo Contrast  Result Date: 05/03/2018 CLINICAL DATA:  Renal cyst on vascular ultrasound EXAM: MRI ABDOMEN WITHOUT CONTRAST TECHNIQUE: Multiplanar multisequence MR imaging was performed without the administration of intravenous contrast. COMPARISON:  None. FINDINGS: Lower chest: Lung bases are clear. Hepatobiliary: Scattered hepatic cysts, measuring up to 12 mm in the central right liver (series 6/image 7). Gallbladder is mildly thick-walled although underdistended (  series 7/image 20). No intrahepatic or extrahepatic ductal dilatation. No choledocholithiasis is seen. Pancreas:  Within normal limits. Spleen: Multiple splenic lesions measuring up to 16 mm (series 6/image 13), indeterminate and incompletely evaluated in the absence of intravenous contrast administration. Spleen is not enlarged. While lymphoma is technically possible, statistically this appearance is most likely related to granulomatous disease or benign lesions such as splenic hemangiomas. Splenic metastases are unlikely given the lack of known primary malignancy (or evident metastases elsewhere). Adrenals/Urinary Tract:  Adrenal glands are within normal limits. 14 mm simple cyst along the posterior left lower kidney (series 6/image 25),  benign. 8 mm simple cyst along the anterior interpolar right kidney (series 6/image 23), benign. Although poorly evaluated given the lack of intravenous contrast, there is no finding suspicious for solid mass. No hydronephrosis. Stomach/Bowel: Stomach and visualized bowel is grossly unremarkable. Vascular/Lymphatic:  No evidence of abdominal aortic aneurysm. No suspicious abdominal lymphadenopathy. Other:  No abdominal ascites. Musculoskeletal: Upper lumbar levoscoliosis. IMPRESSION: 14 mm left renal cyst and 8 mm right renal cyst, simple on unenhanced MRI, benign. Multiple splenic lesions measuring up to 16 mm, incompletely evaluated/characterized, likely benign although technically indeterminate. Consider 32-month follow-up MRI to document stability. Electronically Signed   By: Julian Hy M.D.   On: 05/03/2018 19:24       Subjective:   Discharge Exam: Vitals:   05/04/18 0839 05/04/18 1143  BP: (!) 126/59 (!) 108/53  Pulse:  (!) 55  Resp:  16  Temp:  98.7 F (37.1 C)  SpO2:  97%   Vitals:   05/03/18 2201 05/04/18 0326 05/04/18 0839 05/04/18 1143  BP: 119/60 126/62 (!) 126/59 (!) 108/53  Pulse: 62 61  (!) 55  Resp:  16  16  Temp:  99.1 F (37.3 C)  98.7 F (37.1 C)  TempSrc:  Oral  Oral  SpO2:  98%  97%  Weight:  46.9 kg (103 lb 6.4 oz)    Height:         General exam: Appears calm and comfortable  Respiratory system: Clear to auscultation. Respiratory effort normal. Cardiovascular system: Regular rate and rhythm, no murmurs Gastrointestinal system: Soft, nondistended, no rebound or guarding, plus bowel sounds Central nervous system: Alert and oriented. No focal neurological deficits. Extremities: No lower extremity edema Skin: Senile purpura, contusions on left upper extremity Psychiatry: Unable to assess due to underlying dementia      The results of significant diagnostics from this hospitalization (including imaging, microbiology, ancillary and laboratory) are  listed below for reference.     Microbiology: No results found for this or any previous visit (from the past 240 hour(s)).   Labs: BNP (last 3 results) Recent Labs    04/29/18 2144  BNP 161.0*   Basic Metabolic Panel: Recent Labs  Lab 04/28/18 1434 04/29/18 1227  04/30/18 0545  05/01/18 0528 05/01/18 1906 05/02/18 0957 05/02/18 1640 05/03/18 0619 05/04/18 0725  NA 146* 144   < > 148*  --  147*  --  146*  --  147* 144  K 1.9* 2.3*   < > 2.2*   < > 2.8* 3.1* 2.9* 4.1 4.5 4.1  CL 91* 94*   < > 100  --  102  --  103  --  109 109  CO2 28 33*   < > 33*  --  34*  --  34*  --  30 28  GLUCOSE 134* 111*   < > 102*  --  102*  --  102*  --  100* 90  BUN 10 15   < > 9  --  9  --  7*  --  12 12  CREATININE 1.36* 1.69*   < > 1.40*  --  1.59*  --  1.42*  --  1.75* 1.52*  CALCIUM 10.5* 9.7   < > 8.9  --  9.2  --  9.7  --  9.6 9.3  MG 2.4* 2.4  --  2.4  --   --   --   --   --  2.3  --   PHOS 2.2*  --   --   --   --   --   --   --   --   --   --    < > = values in this interval not displayed.   Liver Function Tests: Recent Labs  Lab 04/28/18 1434 04/29/18 1227 05/03/18 0619  AST 92* 75* 127*  ALT 64* 57* 96*  ALKPHOS 82 66 64  BILITOT 0.7 1.2 0.7  PROT 7.4 6.8 5.5*  ALBUMIN 4.8 4.1 3.1*   No results for input(s): LIPASE, AMYLASE in the last 168 hours. No results for input(s): AMMONIA in the last 168 hours. CBC: Recent Labs  Lab 04/28/18 1434 04/29/18 1227 04/30/18 0545 05/01/18 0528 05/03/18 0619  WBC 7.1 6.1 6.2 6.7 6.8  NEUTROABS 4.8 2.9  --   --   --   HGB 15.0 14.2 11.9* 11.8* 11.9*  HCT 44.5 41.4 35.5* 35.3* 36.2  MCV 89 88.8 92.0 92.2 93.8  PLT 190 116* 148* 170 185   Cardiac Enzymes: No results for input(s): CKTOTAL, CKMB, CKMBINDEX, TROPONINI in the last 168 hours. BNP: Invalid input(s): POCBNP CBG: No results for input(s): GLUCAP in the last 168 hours. D-Dimer No results for input(s): DDIMER in the last 72 hours. Hgb A1c No results for input(s):  HGBA1C in the last 72 hours. Lipid Profile No results for input(s): CHOL, HDL, LDLCALC, TRIG, CHOLHDL, LDLDIRECT in the last 72 hours. Thyroid function studies No results for input(s): TSH, T4TOTAL, T3FREE, THYROIDAB in the last 72 hours.  Invalid input(s): FREET3 Anemia work up No results for input(s): VITAMINB12, FOLATE, FERRITIN, TIBC, IRON, RETICCTPCT in the last 72 hours. Urinalysis    Component Value Date/Time   COLORURINE YELLOW 04/30/2018 1146   APPEARANCEUR CLOUDY (A) 04/30/2018 1146   LABSPEC 1.004 (L) 04/30/2018 1146   PHURINE 9.0 (H) 04/30/2018 1146   GLUCOSEU NEGATIVE 04/30/2018 1146   HGBUR MODERATE (A) 04/30/2018 1146   BILIRUBINUR NEGATIVE 04/30/2018 1146   KETONESUR NEGATIVE 04/30/2018 1146   PROTEINUR 30 (A) 04/30/2018 1146   NITRITE NEGATIVE 04/30/2018 1146   LEUKOCYTESUR NEGATIVE 04/30/2018 1146   Sepsis Labs Invalid input(s): PROCALCITONIN,  WBC,  LACTICIDVEN Microbiology No results found for this or any previous visit (from the past 240 hour(s)).   Time coordinating discharge: Over 30 minutes  SIGNED:   Cristy Folks, MD  [facility] 05/04/2018, 2:30 PM  If 7PM-7AM, please contact night-coverage [email or web address] Password [email or web address]

## 2018-05-04 NOTE — Progress Notes (Signed)
Occupational Therapy Treatment Patient Details Name: Lindsey Goodman MRN: [id number] DOB: May 02, 1941 Today's Date: 05/04/2018    History of present illness 77 year old with past medical history relevant for hypertension, stage III CKD, hyperlipidemia, hypothyroidism, dementia who came in for recurrent falls and was found to have profound hypokalemia and mildly elevated LFTs.   OT comments  Pt progressing towards established OT goals. Pt continues to present with decreased balance and safety. Pt performing toileting, grooming at sink, and dressing with Min Guard A for safety. Continue to recommend dc to home with HHOT with 24/7 support and will continue to follow acutely as admitted to facilitate safe dc.    Follow Up Recommendations  Home health OT;Supervision/Assistance - 24 hour    Equipment Recommendations  Tub/shower seat    Recommendations for Other Services      Precautions / Restrictions Precautions Precautions: Fall Restrictions Weight Bearing Restrictions: No       Mobility Bed Mobility Overal bed mobility: Independent                Transfers Overall transfer level: Needs assistance Equipment used: None Transfers: Sit to/from Stand Sit to Stand: Min guard         General transfer comment: min guard for safety due to impulsivity, mildly unsteady upon standing, was staggering    Balance Overall balance assessment: Mild deficits observed, not formally tested                                         ADL either performed or assessed with clinical judgement   ADL Overall ADL's : Needs assistance/impaired     Grooming: Wash/dry hands;Min guard;Standing;Oral care Grooming Details (indicate cue type and reason): Min Guard for safety.          Upper Body Dressing : Set up;Supervision/safety;Sitting   Lower Body Dressing: Min guard;Sit to/from stand   Toilet Transfer: Min guard;Ambulation;Comfort height toilet   Toileting- Clothing  Manipulation and Hygiene: Min guard;Sit to/from stand       Functional mobility during ADLs: Min guard General ADL Comments: Min Guard A for safety. Cues for ST memory and safety awarness.     Vision       Perception     Praxis      Cognition Arousal/Alertness: Awake/alert Behavior During Therapy: Impulsive;Flat affect Overall Cognitive Status: No family/caregiver present to determine baseline cognitive functioning                                 General Comments: pt with h/o dementia. Decreased safety awareness        Exercises     Shoulder Instructions       General Comments      Pertinent Vitals/ Pain       Pain Assessment: No/denies pain  Home Living                                          Prior Functioning/Environment              Frequency  Min 2X/week        Progress Toward Goals  OT Goals(current goals can now be found in the care plan section)  Progress towards OT goals: Progressing toward  goals  Acute Rehab OT Goals Patient Stated Goal: Pt does voice desire to return home.  But given that she has poor awareness of deficits, difficult to engage her in goal setting  OT Goal Formulation: Patient unable to participate in goal setting Time For Goal Achievement: 05/16/18 Potential to Achieve Goals: Good ADL Goals Pt Will Perform Grooming: with modified independence;standing Pt Will Perform Upper Body Bathing: with modified independence;sitting Pt Will Perform Lower Body Bathing: with modified independence;sit to/from stand Pt Will Perform Upper Body Dressing: with modified independence;sitting Pt Will Perform Lower Body Dressing: with modified independence;sit to/from stand Pt Will Transfer to Toilet: with modified independence;ambulating;regular height toilet;grab bars Pt Will Perform Toileting - Clothing Manipulation and hygiene: with modified independence;sit to/from stand Pt Will Perform Tub/Shower  Transfer: Shower transfer;with supervision;grab bars  Plan Discharge plan remains appropriate    Co-evaluation                 AM-PAC PT "6 Clicks" Daily Activity     Outcome Measure   Help from another person eating meals?: None Help from another person taking care of personal grooming?: None Help from another person toileting, which includes using toliet, bedpan, or urinal?: None Help from another person bathing (including washing, rinsing, drying)?: A Little Help from another person to put on and taking off regular upper body clothing?: None Help from another person to put on and taking off regular lower body clothing?: A Little 6 Click Score: 22    End of Session Equipment Utilized During Treatment: Gait belt  OT Visit Diagnosis: Unsteadiness on feet (R26.81);Cognitive communication deficit (R41.841)   Activity Tolerance Patient tolerated treatment well   Patient Left in chair;with call bell/phone within reach;with chair alarm set   Nurse Communication Mobility status        Time: 8546-2703 OT Time Calculation (min): 10 min  Charges: OT General Charges $OT Visit: 1 Visit OT Treatments $Self Care/Home Management : 8-22 mins  [place], OTR/L Acute Rehab Pager: [phone] Office: [place] 05/04/2018, 3:17 PM

## 2018-05-04 NOTE — Progress Notes (Signed)
Pt has orders to be discharged. Discharge instructions given and pt has no additional questions at this time. Medication regimen reviewed and pt educated. Pt verbalized understanding and has no additional questions. Telemetry box removed. IV removed and site in good condition. Pt stable and waiting for transportation. 

## 2018-05-04 NOTE — Progress Notes (Signed)
PROGRESS NOTE    Lindsey Goodman  DTO:[id number] DOB: Sep 17, 1941 DOA: 04/29/2018 PCP: Troy Sine, MD    Brief Narrative:  77 year old with past medical history relevant for hypertension, stage III CKD, hyperlipidemia, hypothyroidism, dementia who came in for recurrent falls and was found to have profound hypokalemia and mildly elevated LFTs.   Assessment & Plan:   Principal Problem:   Acute renal failure superimposed on stage 3 chronic kidney disease (HCC) Active Problems:   Essential hypertension   Hypothyroidism   Hyperlipidemia   Dementia   Hypokalemia   Fall   Abnormal LFTs   #) Profound hypokalemia: Resolved after significant repletion.  Patient metabolic alkalosis is resolved as well.  Unclear why patient had such severe potassium wasting. -Urine creatinine pending, urine potassium 18, urine sodium greater than 40, urine pH is quite alkalotic, urine is quite dilute, urine awesome's are low and serum calcium is elevated. -Renal vascular ultrasound on 05/01/2018 shows multiple renal cysts and mild stenosis of the left renal artery -MRI abdomen was done without contrast.  Renal cysts were appeared to be benign.  Multiple splenic lesions were noted that were thought to be secondary to granulomatous disease and a repeat MRI is recommended in approximately 6 months. -  plasma aldosterone concentration and plasma renin activity pending -Telemetry discontinue  #) Fall: Likely secondary to muscle weakness from hypokalemia.   -PT consult recommends home health PT and OT recommends home health PT if patient is 24-hour care however on review of the chart patient's daughter reports that the patient does not have 24-hour care and that  #) Hypertension: -Continue amlodipine 5 mg daily -Hold on losartan 40 mg daily - Continue metoprolol tartrate 50 mg twice daily  #) Abnormal LFTs: Very mild hepatocellular pattern of liver injury.  This was not present approximately 6 months ago. -  Acute hepatitis panel negative -MRI abdomen Shows no evidence of liver disease, outpatient follow-up  #) Hypothyroidism: -Continue levothyroxine 100 mg daily  #) Stage III CKD: Stable  #) Dementia: -Continue memantine 10 mg twice daily -Continue donepezil 5 mg daily  #) Hyperlipidemia: - Continue Zetia 10 mg daily -Continue rosuvastatin 40 mg daily, no reason to hold in the setting of mild LFT elevation  Fluids: Tolerating p.o. Elect lites: Per above Nutrition: Regular diet  Prophylaxis: Enoxaparin  Disposition: Pending discussion with family about skilled nursing versus discharge home  Full code    Consultants:   None  Procedures:   None  Antimicrobials:   None   Subjective: Patient reports she is doing fairly well.  She is eager to go home.  She denies any nausea, vomiting, diarrhea, cough, congestion, rhinorrhea.  Objective: Vitals:   05/03/18 2004 05/03/18 2201 05/04/18 0326 05/04/18 0839  BP: (!) 109/50 119/60 126/62 (!) 126/59  Pulse: 65 62 61   Resp: 20  16   Temp: 98 F (36.7 C)  99.1 F (37.3 C)   TempSrc: Oral  Oral   SpO2: 100%  98%   Weight:   46.9 kg (103 lb 6.4 oz)   Height:        Intake/Output Summary (Last 24 hours) at 05/04/2018 1027 Last data filed at 05/04/2018 0900 Gross per 24 hour  Intake 120 ml  Output 300 ml  Net -180 ml   Filed Weights   05/02/18 0500 05/03/18 0400 05/04/18 0326  Weight: 46.9 kg (103 lb 4.8 oz) 47.2 kg (104 lb) 46.9 kg (103 lb 6.4 oz)    Examination:  General exam: Appears calm and comfortable  Respiratory system: Clear to auscultation. Respiratory effort normal. Cardiovascular system: Regular rate and rhythm, no murmurs Gastrointestinal system: Soft, nondistended, no rebound or guarding, plus bowel sounds Central nervous system: Alert and oriented. No focal neurological deficits. Extremities: No lower extremity edema Skin: Senile purpura, contusions on left upper extremity Psychiatry: Unable to  assess due to underlying dementia    Data Reviewed: I have personally reviewed following labs and imaging studies  CBC: Recent Labs  Lab 04/28/18 1434 04/29/18 1227 04/30/18 0545 05/01/18 0528 05/03/18 0619  WBC 7.1 6.1 6.2 6.7 6.8  NEUTROABS 4.8 2.9  --   --   --   HGB 15.0 14.2 11.9* 11.8* 11.9*  HCT 44.5 41.4 35.5* 35.3* 36.2  MCV 89 88.8 92.0 92.2 93.8  PLT 190 116* 148* 170 431   Basic Metabolic Panel: Recent Labs  Lab 04/28/18 1434 04/29/18 1227  04/30/18 0545  05/01/18 0528 05/01/18 1906 05/02/18 0957 05/02/18 1640 05/03/18 0619 05/04/18 0725  NA 146* 144   < > 148*  --  147*  --  146*  --  147* 144  K 1.9* 2.3*   < > 2.2*   < > 2.8* 3.1* 2.9* 4.1 4.5 4.1  CL 91* 94*   < > 100  --  102  --  103  --  109 109  CO2 28 33*   < > 33*  --  34*  --  34*  --  30 28  GLUCOSE 134* 111*   < > 102*  --  102*  --  102*  --  100* 90  BUN 10 15   < > 9  --  9  --  7*  --  12 12  CREATININE 1.36* 1.69*   < > 1.40*  --  1.59*  --  1.42*  --  1.75* 1.52*  CALCIUM 10.5* 9.7   < > 8.9  --  9.2  --  9.7  --  9.6 9.3  MG 2.4* 2.4  --  2.4  --   --   --   --   --  2.3  --   PHOS 2.2*  --   --   --   --   --   --   --   --   --   --    < > = values in this interval not displayed.   GFR: Estimated Creatinine Clearance: 23.3 mL/min (A) (by C-G formula based on SCr of 1.52 mg/dL (H)). Liver Function Tests: Recent Labs  Lab 04/28/18 1434 04/29/18 1227 05/03/18 0619  AST 92* 75* 127*  ALT 64* 57* 96*  ALKPHOS 82 66 64  BILITOT 0.7 1.2 0.7  PROT 7.4 6.8 5.5*  ALBUMIN 4.8 4.1 3.1*   No results for input(s): LIPASE, AMYLASE in the last 168 hours. No results for input(s): AMMONIA in the last 168 hours. Coagulation Profile: No results for input(s): INR, PROTIME in the last 168 hours. Cardiac Enzymes: No results for input(s): CKTOTAL, CKMB, CKMBINDEX, TROPONINI in the last 168 hours. BNP (last 3 results) No results for input(s): PROBNP in the last 8760 hours. HbA1C: No  results for input(s): HGBA1C in the last 72 hours. CBG: No results for input(s): GLUCAP in the last 168 hours. Lipid Profile: No results for input(s): CHOL, HDL, LDLCALC, TRIG, CHOLHDL, LDLDIRECT in the last 72 hours. Thyroid Function Tests: No results for input(s): TSH, T4TOTAL, FREET4, T3FREE, THYROIDAB in the last 72  hours. Anemia Panel: No results for input(s): VITAMINB12, FOLATE, FERRITIN, TIBC, IRON, RETICCTPCT in the last 72 hours. Sepsis Labs: No results for input(s): PROCALCITON, LATICACIDVEN in the last 168 hours.  No results found for this or any previous visit (from the past 240 hour(s)).       Radiology Studies: Mr Abdomen Wo Contrast  Result Date: 05/03/2018 CLINICAL DATA:  Renal cyst on vascular ultrasound EXAM: MRI ABDOMEN WITHOUT CONTRAST TECHNIQUE: Multiplanar multisequence MR imaging was performed without the administration of intravenous contrast. COMPARISON:  None. FINDINGS: Lower chest: Lung bases are clear. Hepatobiliary: Scattered hepatic cysts, measuring up to 12 mm in the central right liver (series 6/image 7). Gallbladder is mildly thick-walled although underdistended (series 7/image 20). No intrahepatic or extrahepatic ductal dilatation. No choledocholithiasis is seen. Pancreas:  Within normal limits. Spleen: Multiple splenic lesions measuring up to 16 mm (series 6/image 13), indeterminate and incompletely evaluated in the absence of intravenous contrast administration. Spleen is not enlarged. While lymphoma is technically possible, statistically this appearance is most likely related to granulomatous disease or benign lesions such as splenic hemangiomas. Splenic metastases are unlikely given the lack of known primary malignancy (or evident metastases elsewhere). Adrenals/Urinary Tract:  Adrenal glands are within normal limits. 14 mm simple cyst along the posterior left lower kidney (series 6/image 25), benign. 8 mm simple cyst along the anterior interpolar right  kidney (series 6/image 23), benign. Although poorly evaluated given the lack of intravenous contrast, there is no finding suspicious for solid mass. No hydronephrosis. Stomach/Bowel: Stomach and visualized bowel is grossly unremarkable. Vascular/Lymphatic:  No evidence of abdominal aortic aneurysm. No suspicious abdominal lymphadenopathy. Other:  No abdominal ascites. Musculoskeletal: Upper lumbar levoscoliosis. IMPRESSION: 14 mm left renal cyst and 8 mm right renal cyst, simple on unenhanced MRI, benign. Multiple splenic lesions measuring up to 16 mm, incompletely evaluated/characterized, likely benign although technically indeterminate. Consider 82-month follow-up MRI to document stability. Electronically Signed   By: Julian Hy M.D.   On: 05/03/2018 19:24        Scheduled Meds: . amLODipine  10 mg Oral Daily  . donepezil  5 mg Oral Daily  . ezetimibe  10 mg Oral Daily  . levothyroxine  100 mcg Oral QAC breakfast  . memantine  10 mg Oral BID  . metoprolol tartrate  50 mg Oral BID  . rosuvastatin  40 mg Oral Daily   Continuous Infusions: . sodium chloride 20 mL/hr at 05/02/18 1700     LOS: 4 days    Time spent: [place], MD [facility]  If 7PM-7AM, please contact night-coverage [email or web address] Password [facility] 05/04/2018, 10:27 AM

## 2018-05-05 ENCOUNTER — Telehealth: Payer: Self-pay | Admitting: Cardiovascular Disease

## 2018-05-05 NOTE — Telephone Encounter (Signed)
Left message to call back  

## 2018-05-05 NOTE — Telephone Encounter (Signed)
Spoke with pt's daughter who is calling to she if pt still needs to come in for ct of abd and head scheduled for 05/07/18. She reports pt was just recently in the hospital and had a lot of different test performed. Daughter also wanting to know if she can cancel appointment on 05/13/18 with Almyra Deforest, PA. She reports she would much rather have the pt seen by Dr. Claiborne Billings and if appointment can't be moved up with him, then she would prefer to just keep appointment scheduled for 07/27/18. Routing to MD.

## 2018-05-05 NOTE — Telephone Encounter (Signed)
Please call this number...[phone]

## 2018-05-05 NOTE — Telephone Encounter (Signed)
New Message        Patient's daughter called. Patient  was in the [facility] for 5 days and they ran a full MRI, and other test. Patient's daughter is asking if she should keep appt. On 08/02. Pls advise.

## 2018-05-06 NOTE — Telephone Encounter (Signed)
Follow up     Should patient have CT scans on 8/2?

## 2018-05-06 NOTE — Telephone Encounter (Signed)
Spoke with patients daughter and she did not know if Dr Claiborne Billings still wanted scans secondary to recent admission. She also stated she could not bring mother in next week. Scheduled her a follow up with Doreene Burke PA on 05/17/18 and cancelled her CT's for now. She wishes to discuss those the follow up.

## 2018-05-07 ENCOUNTER — Inpatient Hospital Stay: Admission: RE | Admit: 2018-05-07 | Payer: PPO | Source: Ambulatory Visit

## 2018-05-07 ENCOUNTER — Other Ambulatory Visit: Payer: PPO

## 2018-05-11 ENCOUNTER — Other Ambulatory Visit: Payer: PPO

## 2018-05-13 ENCOUNTER — Ambulatory Visit: Payer: PPO | Admitting: Physician Assistant

## 2018-05-17 ENCOUNTER — Encounter: Payer: Self-pay | Admitting: Cardiology

## 2018-05-17 ENCOUNTER — Ambulatory Visit (INDEPENDENT_AMBULATORY_CARE_PROVIDER_SITE_OTHER): Payer: PPO | Admitting: Cardiology

## 2018-05-17 VITALS — BP 178/81 | HR 56 | Ht 65.0 in | Wt 104.0 lb

## 2018-05-17 DIAGNOSIS — W19XXXD Unspecified fall, subsequent encounter: Secondary | ICD-10-CM | POA: Diagnosis not present

## 2018-05-17 DIAGNOSIS — E876 Hypokalemia: Secondary | ICD-10-CM

## 2018-05-17 DIAGNOSIS — F0150 Vascular dementia without behavioral disturbance: Secondary | ICD-10-CM

## 2018-05-17 DIAGNOSIS — E038 Other specified hypothyroidism: Secondary | ICD-10-CM | POA: Diagnosis not present

## 2018-05-17 DIAGNOSIS — I10 Essential (primary) hypertension: Secondary | ICD-10-CM | POA: Diagnosis not present

## 2018-05-17 NOTE — Assessment & Plan Note (Signed)
I re checked her B/P myself and got a much lower reading, 118/66.

## 2018-05-17 NOTE — Assessment & Plan Note (Signed)
She is on Namenda. She is not a reliable historian

## 2018-05-17 NOTE — Assessment & Plan Note (Signed)
On Crestor and Zetia 

## 2018-05-17 NOTE — Assessment & Plan Note (Signed)
F/U with Dr Claiborne Billings-

## 2018-05-17 NOTE — Progress Notes (Addendum)
05/17/2018 Lindsey Goodman   Nov 16, 1940  [id number]  Primary Physician Troy Sine, MD Primary Cardiologist: Dr Claiborne Billings  HPI:  77 y/o female here for a post hospital follow up. She and her husband describe Dr Claiborne Billings as her PCP and her cardiologist.  The pt has a history of HTN, HLD, PVD, and dementia. Dr Claiborne Billings saw her 04/26/18 and decreased her Lopressor from 100 mg BID to 50 mg BID secondary to fatigue. He also noted the patent has had a 30 lb wgt loss over the past year.   The pt ended up in the ED 04/29/18 with significant hypokalemia-K+ 2.2 (Mg++ 2.4) and a history of falls at home. She was admitted and her electrolytes replaced. She was hypotensive on admission and her ARB was held, this was resumed at discharge. Renal US showed cysts and mild Lt RAS. An MRI of the abdomen was done without contrast.  Renal cysts were appeared to be benign.  There were multiple splenic lesions noted that were thought to be secondary to granulomatous disease and a repeat MRI is recommended in approximately 6 months. TSH was 9.9, Aldosterone < 1.0.  She is in the office today with her husband for follow up. She forgot to bring her current medication list but says its the same as her discharge list. I asked her husband to double check this when he goes home and call us back with any descrepanccies. She denies any complaints. She has not had any falls per her husband. Her initial B/P reading was high- 629 systolic. I rechecked this myself and got 118/66. I asked them if they had a B/P cuff at home and her husband told me "they never work for her".    Current Outpatient Medications  Medication Sig Dispense Refill  . amLODipine (NORVASC) 5 MG tablet Take 1 tablet (5 mg total) by mouth daily. Keep OV. 30 tablet 0  . donepezil (ARICEPT) 5 MG tablet TAKE 1 TABLET BY MOUTH EVERY DAY 90 tablet 2  . ezetimibe (ZETIA) 10 MG tablet Take 1 tablet (10 mg total) by mouth daily. 90 tablet 3  . levothyroxine (SYNTHROID,  LEVOTHROID) 75 MCG tablet TAKE 1 TABLET (75 MCG TOTAL) BY MOUTH DAILY BEFORE BREAKFAST. 30 tablet 3  . memantine (NAMENDA) 10 MG tablet TAKE 1 TABLET BY MOUTH TWICE A DAY 180 tablet 3  . metoprolol tartrate (LOPRESSOR) 50 MG tablet Take 1 tablet (50 mg total) by mouth 2 (two) times daily. 180 tablet 3  . olmesartan (BENICAR) 40 MG tablet TAKE 1 TABLET BY MOUTH EVERY DAY 90 tablet 0  . rosuvastatin (CRESTOR) 40 MG tablet Take 1 tablet (40 mg total) by mouth daily. Keep OV 90 tablet 0   No current facility-administered medications for this visit.     Allergies  Allergen Reactions  . Atorvastatin Other (See Comments)    Pt states she felt confused.  . Amlodipine Besylate Swelling    Takes at home  . Prednisone     No appetite    Past Medical History:  Diagnosis Date  . GERD (gastroesophageal reflux disease)   . Hx of Doppler ultrasound 12/09/2010   Renal duplexsuggested distal abdominal aorta segment in the origin of the right and left commom iliac arteries with elevated velocities consistent with at least 50% diameter reduction, 60% narrowing in the right renal artery with stable PSV at 210.  Marland Kitchen Hx of echocardiogram 12/09/2010   EF 55% showed mild concrentric LVH with normal systolic function and  grade 1 diastolic dysfunction. she had mild to moderate LA dilation, mild to moderate mitral annular calcification with moderate MR, and she had trival tricupid regurgitation.  . Hyperlipidemia   . Hypertension   . Hypothyroidism     Social History   Socioeconomic History  . Marital status: Married    Spouse name: Alvester Chou  . Number of children: 2  . Years of education: 64  . Highest education level: Not on file  Occupational History    Comment: retired  Scientific laboratory technician  . Financial resource strain: Not on file  . Food insecurity:    Worry: Not on file    Inability: Not on file  . Transportation needs:    Medical: Not on file    Non-medical: Not on file  Tobacco Use  . Smoking  status: Never Smoker  . Smokeless tobacco: Never Used  Substance and Sexual Activity  . Alcohol use: No  . Drug use: No  . Sexual activity: Yes    Birth control/protection: None  Lifestyle  . Physical activity:    Days per week: Not on file    Minutes per session: Not on file  . Stress: Not on file  Relationships  . Social connections:    Talks on phone: Not on file    Gets together: Not on file    Attends religious service: Not on file    Active member of club or organization: Not on file    Attends meetings of clubs or organizations: Not on file    Relationship status: Not on file  . Intimate partner violence:    Fear of current or ex partner: Not on file    Emotionally abused: Not on file    Physically abused: Not on file    Forced sexual activity: Not on file  Other Topics Concern  . Not on file  Social History Narrative   Lives with husband   Caffeine- none   Education 19     Family History  Problem Relation Age of Onset  . Hypertension Mother   . Stroke Mother   . Hypertension Father   . Stroke Father   . Colon cancer Neg Hx   . Stomach cancer Neg Hx      Review of Systems: General: negative for chills, fever, night sweats or weight changes.  Cardiovascular: negative for chest pain, dyspnea on exertion, edema, orthopnea, palpitations, paroxysmal nocturnal dyspnea or shortness of breath Dermatological: negative for rash Respiratory: negative for cough or wheezing Urologic: negative for hematuria Abdominal: negative for nausea, vomiting, diarrhea, bright red blood per rectum, melena, or hematemesis Neurologic: negative for visual changes, syncope, or dizziness All other systems reviewed and are otherwise negative except as noted above.    Blood pressure (!) 178/81, pulse (!) 56, height 5\' 5"  (1.651 m), weight 104 lb (47.2 kg).  General appearance: alert, cooperative, appears stated age, cachectic and no distress Neck: no carotid bruit and no JVD Lungs:  clear to auscultation bilaterally Heart: regular rate and rhythm Extremities: no edema Skin: pale cool dry Neurologic: Grossly normal, she did not elaborate on any symptoms    ASSESSMENT AND PLAN:   Essential hypertension I re checked her B/P myself and got a much lower reading, 118/66.  Acute on chronic renal insufficiency Recent hospitalization secondary to hypokalemia and weakness- medications adjusted  Hypokalemia Profound hypokalemia prompting admission 04/29/18- etiology is not clear  Hypothyroidism F/U with Dr Claiborne Billings-   Dyslipidemia On Crestor and Zetia  Dementia She is  on Namenda. She is not a reliable historian  Fall Weakness from hypokalemia or possible orthostatic hypotension   PLAN  No change in her medications- f/u BMP and LFTs today. I wonder if she is not overmedicated for HTN now that she has los weight. Her B/P has been labile and her reading difficult to accurately reproduce. I would consider decreasing her beta blocker further, will review with Dr Claiborne Billings.  F/U Dr Claiborne Billings in October as scheduled.   Kerin Ransom PA-C 05/17/2018 12:43 PM

## 2018-05-17 NOTE — Assessment & Plan Note (Signed)
Profound hypokalemia prompting admission 04/29/18- etiology is not clear

## 2018-05-17 NOTE — Patient Instructions (Signed)
Medication Instructions:  Your physician recommends that you continue on your current medications as directed. Please refer to the Current Medication list given to you today.  Labwork: Your physician recommends that you return for lab work in: BMET-TODAY  Testing/Procedures: NONE  Follow-Up: FOLLOW UP AS SCHEDULED  Any Other Special Instructions Will Be Listed Below (If Applicable). If you need a refill on your cardiac medications before your next appointment, please call your pharmacy.

## 2018-05-17 NOTE — Assessment & Plan Note (Signed)
Weakness from hypokalemia or possible orthostatic hypotension

## 2018-05-17 NOTE — Assessment & Plan Note (Signed)
Recent hospitalization secondary to hypokalemia and weakness- medications adjusted

## 2018-05-18 LAB — HEPATIC FUNCTION PANEL
ALT: 89 IU/L — ABNORMAL HIGH (ref 0–32)
AST: 87 IU/L — ABNORMAL HIGH (ref 0–40)
Albumin: 4.6 g/dL (ref 3.5–4.8)
Alkaline Phosphatase: 92 IU/L (ref 39–117)
Bilirubin Total: 0.6 mg/dL (ref 0.0–1.2)
Bilirubin, Direct: 0.17 mg/dL (ref 0.00–0.40)
Total Protein: 7 g/dL (ref 6.0–8.5)

## 2018-05-18 LAB — BASIC METABOLIC PANEL
BUN/Creatinine Ratio: 11 — ABNORMAL LOW (ref 12–28)
BUN: 12 mg/dL (ref 8–27)
CO2: 28 mmol/L (ref 20–29)
Calcium: 10.3 mg/dL (ref 8.7–10.3)
Chloride: 100 mmol/L (ref 96–106)
Creatinine, Ser: 1.07 mg/dL — ABNORMAL HIGH (ref 0.57–1.00)
GFR calc Af Amer: 58 mL/min/{1.73_m2} — ABNORMAL LOW (ref >59)
GFR calc non Af Amer: 51 mL/min/{1.73_m2} — ABNORMAL LOW (ref >59)
Glucose: 97 mg/dL (ref 65–99)
Potassium: 3.4 mmol/L — ABNORMAL LOW (ref 3.5–5.2)
Sodium: 146 mmol/L — ABNORMAL HIGH (ref 134–144)

## 2018-05-25 ENCOUNTER — Other Ambulatory Visit: Payer: Self-pay

## 2018-05-25 ENCOUNTER — Telehealth: Payer: Self-pay | Admitting: Cardiovascular Disease

## 2018-05-25 DIAGNOSIS — E785 Hyperlipidemia, unspecified: Secondary | ICD-10-CM

## 2018-05-25 MED ORDER — ROSUVASTATIN CALCIUM 20 MG PO TABS: 20.0000 mg | ORAL_TABLET | Freq: Every day | ORAL | 0 refills | Status: DC

## 2018-05-25 NOTE — Telephone Encounter (Signed)
New Message:   Patient returning call for test results

## 2018-05-26 ENCOUNTER — Other Ambulatory Visit: Payer: Self-pay | Admitting: Cardiovascular Disease

## 2018-05-26 NOTE — Telephone Encounter (Signed)
Notes recorded by Harold Hedge, CMA on 05/25/2018 at 4:42 PM EDT Patients daughter Elaysha Bevard notified directly. And voiced understanding.

## 2018-05-26 NOTE — Telephone Encounter (Signed)
Rx sent to pharmacy   

## 2018-06-24 ENCOUNTER — Other Ambulatory Visit: Payer: Self-pay | Admitting: Cardiovascular Disease

## 2018-06-26 ENCOUNTER — Other Ambulatory Visit: Payer: Self-pay | Admitting: Cardiovascular Disease

## 2018-06-28 ENCOUNTER — Other Ambulatory Visit: Payer: Self-pay | Admitting: Cardiovascular Disease

## 2018-07-06 DIAGNOSIS — I480 Paroxysmal atrial fibrillation: Secondary | ICD-10-CM

## 2018-07-06 HISTORY — DX: Paroxysmal atrial fibrillation: I48.0

## 2018-07-27 ENCOUNTER — Other Ambulatory Visit: Payer: Self-pay | Admitting: Cardiovascular Disease

## 2018-07-27 ENCOUNTER — Ambulatory Visit (INDEPENDENT_AMBULATORY_CARE_PROVIDER_SITE_OTHER): Payer: PPO | Admitting: Cardiovascular Disease

## 2018-07-27 ENCOUNTER — Encounter: Payer: Self-pay | Admitting: Cardiovascular Disease

## 2018-07-27 VITALS — BP 102/80 | HR 117 | Ht 65.0 in | Wt 115.8 lb

## 2018-07-27 DIAGNOSIS — I701 Atherosclerosis of renal artery: Secondary | ICD-10-CM | POA: Diagnosis not present

## 2018-07-27 DIAGNOSIS — Z79899 Other long term (current) drug therapy: Secondary | ICD-10-CM

## 2018-07-27 DIAGNOSIS — E876 Hypokalemia: Secondary | ICD-10-CM

## 2018-07-27 DIAGNOSIS — F0150 Vascular dementia without behavioral disturbance: Secondary | ICD-10-CM

## 2018-07-27 DIAGNOSIS — I4891 Unspecified atrial fibrillation: Secondary | ICD-10-CM

## 2018-07-27 DIAGNOSIS — I10 Essential (primary) hypertension: Secondary | ICD-10-CM | POA: Diagnosis not present

## 2018-07-27 MED ORDER — DILTIAZEM HCL ER COATED BEADS 180 MG PO CP24: 180.0000 mg | ORAL_CAPSULE | Freq: Every day | ORAL | 3 refills | Status: DC

## 2018-07-27 MED ORDER — METOPROLOL TARTRATE 75 MG PO TABS: 75.0000 mg | ORAL_TABLET | Freq: Two times a day (BID) | ORAL | 3 refills | Status: DC

## 2018-07-27 MED ORDER — APIXABAN 5 MG PO TABS: 5.0000 mg | ORAL_TABLET | Freq: Two times a day (BID) | ORAL | 3 refills | Status: AC

## 2018-07-27 NOTE — Progress Notes (Signed)
Patient ID: Lindsey Goodman, female   DOB: [birth year], 77 y.o.   MRN: [id number]    HPI: Lindsey Goodman is a 77 y.o. female presents for a 3 month followup cardiology evaluation.  Lindsey Goodman has a history of hypertension, hypothyroidism, hyperlipidemia, as well as GERD. Remotely, she developed a cough secondary to ACE inhibition and also developed hyponatremia secondary to hydrochlorothiazide. In March 2013, a renal duplex study demonstrated a greater than 60% diameter reduction in the distal aorta at the origin of the right and left common iliac arteries, and had equal to or greater than 60% diameter reduction in the right renal artery. Her left renal artery have normal patency. Her kidney sizes were normal.  When I last saw last year in 2015 , she was inadvertently taking both Benicar and irbesartan after her insurance company had switched her to irbesartan and was also taking Lopressor 125 mg twice a day and Aldactone 25 mg daily.   She has been on combination therapy with Zetia 10 mg and atorvastatin 40 mg for her hyperlipidemia. She has taken Prilosec 20 mg for her GERD.  At that time, I recommended that she discontinue the irbesartan and resume the Benicar which she had tolerated well and had gotten insurance approval. In December 2015 cholesterol was 141, LDL 81, HDL 46, TG 71.  CBC was stable.  Potassium was normal at 4.6.  Thyroid function studies were normal on her current dose of levothyroxine 100 g daily.  She underwent a follow-up renal duplex study on 01/08/2015.  This showed moderate amount of atherosclerotic plaque at the distal segment of her abdominal aorta consistent with greater than 50% diameter reduction by velocity.  The right renal artery demonstrated elevated velocities consistent with less than 60% diameter reduction.  She had normal left renal artery.    In 2016 follow-up laboratory revealed a total cholesterol 262, LDL 185, HDL 49 VLDL 28, triglycerides 138.  She had had some  issues with medication confusion but states she was taking the atorvastatin and Zetia.  Repeat blood work showed significant improvement in October 2016 with a total cholesterol 142, HDL 43, and LDL 76.  Apparently, since that time she was mentally confused and she read in the package insert that Lipitor can cause this, and as result, she has discontinued this therapy.   In June 2017 she underwent renal Doppler study which showed a normal caliber abdominal aorta, there was greater than 50% distal aorta stenosis.  Bilateral kidneys were decreased in size in the left kidney was 1.1 cm smaller than the right.  There was mild right renal artery narrowing of less than 59% with normal left renal artery patency.  Family members, had noticed issues with memory. She denied any episodes of chest pain or palpitations.  She had noticed some occasional swelling in her ankles.   She has not been taking statin therapy and has only been on Zetia 10 mg, levothyroxine 75 mg her hypothyroidism and for hypertension  metoprolol tartrate 125 mg twice a day, Benicar 40 mg daily, and spironolactone 25 mg twice a day.  When  I saw her, she had stage II hypertension and I added amlodipine 5 mg to medical regimen and also added furosemide for both blood pressure control and leg swelling.  I scheduled her for follow-up renal duplex scan and evaluation of abdominal aorta.  I recommended.  Due to concerns for possible dementia that she undergo a neurologic evaluation.  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  Her kidney size is stable compared to prior exam in the right kidney measured 0.6 cm larger than the left kidney.  There was a decrease in right renal artery velocities compared to prior exam and these were now the normal range.  Carotid duplex imaging showed heterogeneous plaque bilaterally with 1 at 39%  bilateral ICA stenoses.  Vertebrals showed normal antegrade flow.  She saw Dr. Leta Baptist for neurologic assessment who was concerned for probable mild dementia.  She is now on Namenda.  When Isaw her in December 2018 she had had issues with progressive weight loss.  She had undergone laboratory in January and was found to be hypokalemic with potassium of 2.5, multiple calls were made contact the patient all unsuccessful.  The patient has had continued difficulty with memory issues.  Since June 2018 her weight has reduced from 126 pounds to 98 pounds.  Her last colonoscopy has been over 5 years ago.  She denied any chest pain.  Her appetite is poor.  She admits to early satiety.  She denies chest pain or palpitations.    I last saw her in July 2019.  At that time, family was concerned with progressive dementia.  With her weight loss I recommended CT of her abdomen and chest as well as GI evaluation.  Family members and patient had been discussing advanced directives with no CODE STATUS.  Since I saw her, she underwent an abdominal and renal ultrasound.  She had normal kidney sizes.  There was 1 to 59% stenosis of the left renal artery.  Kidney cysts were noted.  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  She was hospitalized in July following a fall with profound hypokalemia and metabolic alkalosis and MRI of her abdomen without contrast showed the cysts are most likely simple cysts of her kidney however splenic lesions were detected.  She had undergone a head CT which showed soft tissue injuries from her fall without acute intracranial abnormality or skull fracture.  She had chronic cerebral white matter disease which was stable from her prior MRI.  Subsequently she has been seen by Kerin Ransom.  She is here in the office today with her daughter.  She denies any chest pain.  She is unaware of any cardiac arrhythmia.  Numerous family members have developed dementia at older age including her  sister who recently died, a cousin, as well as her mother.  She presents for reevaluation.  Past Medical History:  Diagnosis Date  . GERD (gastroesophageal reflux disease)   . Hx of Doppler ultrasound 12/09/2010   Renal duplexsuggested distal abdominal aorta segment in the origin of the right and left commom iliac arteries with elevated velocities consistent with at least 50% diameter reduction, 60% narrowing in the right renal artery with stable PSV at 210.  Marland Kitchen Hx of echocardiogram 12/09/2010   EF 55% showed mild concrentric LVH with normal systolic function and grade 1 diastolic dysfunction. she had mild to moderate LA dilation, mild to moderate mitral annular calcification with moderate MR, and she had trival tricupid regurgitation.  . Hyperlipidemia   . Hypertension   . Hypothyroidism     Past Surgical History:  Procedure Laterality Date  . no prior surgery    . WISDOM TOOTH EXTRACTION  1980    Allergies  Allergen Reactions  . Atorvastatin Other (See  Comments)    Pt states she felt confused.  . Amlodipine Besylate Swelling    Takes at home  . Prednisone     No appetite    Current Outpatient Medications  Medication Sig Dispense Refill  . donepezil (ARICEPT) 5 MG tablet TAKE 1 TABLET BY MOUTH EVERY DAY 90 tablet 2  . ezetimibe (ZETIA) 10 MG tablet Take 1 tablet (10 mg total) by mouth daily. 90 tablet 3  . levothyroxine (SYNTHROID, LEVOTHROID) 75 MCG tablet TAKE 1 TABLET (75 MCG TOTAL) BY MOUTH DAILY BEFORE BREAKFAST. 30 tablet 3  . memantine (NAMENDA) 10 MG tablet TAKE 1 TABLET BY MOUTH TWICE A DAY 180 tablet 3  . metoprolol tartrate 75 MG TABS Take 75 mg by mouth 2 (two) times daily. 120 tablet 3  . olmesartan (BENICAR) 40 MG tablet TAKE 1 TABLET BY MOUTH EVERY DAY 90 tablet 4  . rosuvastatin (CRESTOR) 20 MG tablet Take 1 tablet (20 mg total) by mouth daily. 30 tablet 0  . apixaban (ELIQUIS) 5 MG TABS tablet Take 1 tablet (5 mg total) by mouth 2 (two) times daily. 180 tablet  3  . diltiazem (CARDIZEM CD) 180 MG 24 hr capsule Take 1 capsule (180 mg total) by mouth daily. 60 capsule 3  . rosuvastatin (CRESTOR) 40 MG tablet Take 1 tablet (40 mg total) by mouth daily. 90 tablet 2   No current facility-administered medications for this visit.     Social History   Socioeconomic History  . Marital status: Married    Spouse name: Alvester Chou  . Number of children: 2  . Years of education: 76  . Highest education level: Not on file  Occupational History    Comment: retired  Scientific laboratory technician  . Financial resource strain: Not on file  . Food insecurity:    Worry: Not on file    Inability: Not on file  . Transportation needs:    Medical: Not on file    Non-medical: Not on file  Tobacco Use  . Smoking status: Never Smoker  . Smokeless tobacco: Never Used  Substance and Sexual Activity  . Alcohol use: No  . Drug use: No  . Sexual activity: Yes    Birth control/protection: None  Lifestyle  . Physical activity:    Days per week: Not on file    Minutes per session: Not on file  . Stress: Not on file  Relationships  . Social connections:    Talks on phone: Not on file    Gets together: Not on file    Attends religious service: Not on file    Active member of club or organization: Not on file    Attends meetings of clubs or organizations: Not on file    Relationship status: Not on file  . Intimate partner violence:    Fear of current or ex partner: Not on file    Emotionally abused: Not on file    Physically abused: Not on file    Forced sexual activity: Not on file  Other Topics Concern  . Not on file  Social History Narrative   Lives with husband   Caffeine- none   Education 57    Family History  Problem Relation Age of Onset  . Hypertension Mother   . Stroke Mother   . Hypertension Father   . Stroke Father   . Colon cancer Neg Hx   . Stomach cancer Neg Hx    Social history is notable that she is married has  2 children. He does travel with her  husband's work as well as vacation. She does exercise. There is no tobacco or alcohol use.   ROS General: Negative; No fevers, chills, or night sweats;  HEENT: Negative; No changes in vision or hearing, sinus congestion, difficulty swallowing Pulmonary: Negative; No cough, wheezing, shortness of breath, hemoptysis Cardiovascular: Negative; No chest pain, presyncope, syncope, palpatations GI: Negative; No nausea, vomiting, diarrhea, or abdominal pain GU: Negative; No dysuria, hematuria, or difficulty voiding Musculoskeletal: Negative; no myalgias, joint pain, or weakness Hematologic/Oncology: Mild bruisability; no, bleeding Endocrine: Positive for hypothyroidism; no heat/cold intolerance; no diabetes Neuro: Possible decline in short-term memory. Skin: Occasional welts on her arms. Psychiatric: Flat affect Sleep: Positive for previous history of inadequate sleep duration; No snoring, daytime sleepiness, hypersomnolence, bruxism, restless legs, hypnogognic hallucinations, no cataplexy Other comprehensive 14 point system review is negative.  PE BP 102/80   Pulse (!) 117   Ht '5\' 5"'  (1.651 m)   Wt 115 lb 12.8 oz (52.5 kg)   BMI 19.27 kg/m    Repeat blood pressure 112/82.  Wt Readings from Last 3 Encounters:  07/27/18 115 lb 12.8 oz (52.5 kg)  05/17/18 104 lb (47.2 kg)  05/04/18 103 lb 6.4 oz (46.9 kg)   General: Alert, oriented, no distress.  Skin: normal turgor, no rashes, warm and dry HEENT: Normocephalic, atraumatic. Pupils equal round and reactive to light; sclera anicteric; extraocular muscles intact;  Nose without nasal septal hypertrophy Mouth/Parynx benign; Mallinpatti scale Neck: No JVD, no carotid bruits; normal carotid upstroke Lungs: clear to ausculatation and percussion; no wheezing or rales Chest wall: without tenderness to palpitation Heart: PMI not displaced, irregularly irregular with ventricular rate around 100 to 110 bpm, s1 s2 normal, 1/6 systolic murmur, no  diastolic murmur, no rubs, gallops, thrills, or heaves Abdomen: soft, nontender; no hepatosplenomehaly, BS+; abdominal aorta nontender and not dilated by palpation. Back: no CVA tenderness Pulses 2+ Musculoskeletal: full range of motion, normal strength, no joint deformities Extremities: no clubbing cyanosis or edema, Homan's sign negative  Neurologic: grossly nonfocal; Cranial nerves grossly wnl Psychologic: Normal mood and affect   ECG (independently read by me): Atrial fibrillation with ventricular rate at 117 bpm.  Nonspecific ST-T changes.  Poor anterior R wave progression.  April 26, 2018 ECG (independently read by me): Sinus bradycardia at 50 bpm.  Inferolateral ST changes.  Prolonged QTc interval at 528 msec  December 2018 ECG (independently read by me): Wandering baseline, but probably sinus rhythm at 56 bpm.  LVH by voltage criteria.  June 2018 ECG (independently read by me): Sinus bradycardia 51 bpm.  Nonspecific ST-T changes inferolaterally.  QTc interval 435 ms.  April 2018 ECG (independently read by me): Normal sinus rhythm at 62 bpm.  Left axis deviation.  Nonspecific ST changes in leads 1, aVL, and V6.  November 2017 ECG (independently read by me): Normal sinus rhythm at 63 bpm.  Nonspecific ST changes.  QTc interval 427 ms.  August 2016 ECG (independently read by me): Normal sinus rhythm at 62 bpm.  Normal intervals.  Nonspecific ST changes.  May 2016 ECG (independently read by me): Normal sinus rhythm at 61 bpm.  Nonspecific ST changes.  March 2016 ECG (independently read by me): Normal sinus rhythm at 60 bpm.  Nonspecific ST changes.  Normal intervals.  09/05/2014 ECG (independently read by me) : Sinus bradycardia at 54 bpm.  Nonsignificant ST-T changes  May 2015 ECG  (independently read by me): Sinus bradycardia 57 beats per minute.  One isolated PVC.  Normal intervals.  Prior ECG: Sinus bradycardia 57 beats per minute. Normal intervals. No significant ST  changes.  LABS:  BMP Latest Ref Rng & Units 05/17/2018 05/04/2018 05/03/2018  Glucose 65 - 99 mg/dL 97 90 100(H)  BUN 8 - 27 mg/dL '12 12 12  ' Creatinine 0.57 - 1.00 mg/dL 1.07(H) 1.52(H) 1.75(H)  BUN/Creat Ratio 12 - 28 11(L) - -  Sodium 134 - 144 mmol/L 146(H) 144 147(H)  Potassium 3.5 - 5.2 mmol/L 3.4(L) 4.1 4.5  Chloride 96 - 106 mmol/L 100 109 109  CO2 20 - 29 mmol/L '28 28 30  ' Calcium 8.7 - 10.3 mg/dL 10.3 9.3 9.6    Hepatic Function Latest Ref Rng & Units 05/17/2018 05/03/2018 04/29/2018  Total Protein 6.0 - 8.5 g/dL 7.0 5.5(L) 6.8  Albumin 3.5 - 4.8 g/dL 4.6 3.1(L) 4.1  AST 0 - 40 IU/L 87(H) 127(H) 75(H)  ALT 0 - 32 IU/L 89(H) 96(H) 57(H)  Alk Phosphatase 39 - 117 IU/L 92 64 66  Total Bilirubin 0.0 - 1.2 mg/dL 0.6 0.7 1.2  Bilirubin, Direct 0.00 - 0.40 mg/dL 0.17 - -    CBC Latest Ref Rng & Units 05/03/2018 05/01/2018 04/30/2018  WBC 4.0 - 10.5 K/uL 6.8 6.7 6.2  Hemoglobin 12.0 - 15.0 g/dL 11.9(L) 11.8(L) 11.9(L)  Hematocrit 36.0 - 46.0 % 36.2 35.3(L) 35.5(L)  Platelets 150 - 400 K/uL 185 170 148(L)   Lab Results  Component Value Date   MCV 93.8 05/03/2018   MCV 92.2 05/01/2018   MCV 92.0 04/30/2018   Lab Results  Component Value Date   TSH 9.990 (H) 04/28/2018    BNP No results found for: PROBNP  Lipid Panel     Component Value Date/Time   CHOL 142 04/28/2018 1434   TRIG 119 04/28/2018 1434   HDL 55 04/28/2018 1434   CHOLHDL 2.6 04/28/2018 1434   CHOLHDL 4.7 01/30/2017 1137   VLDL 25 01/30/2017 1137   LDLCALC 63 04/28/2018 1434     RADIOLOGY: No results found.  IMPRESSION:  1. New onset atrial fibrillation ([place])   2. Medication management   3. Vascular dementia without behavioral disturbance ([place])   4. Essential hypertension   5. Renal artery stenosis ([place])   6. Hypokalemia     ASSESSMENT AND PLAN: Ms. 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  A renal duplex scan in March 2013 revealed her PSV was 261 with EDV 55 on the right. He was also found to have narrowing in the distal segment of her aorta.  A follow-up renal duplex study from June 2017 demonstrated PSV on the right  209 and on the left 186, which were slightly improved from previously.  She again was noted to have >50% distal aorta stenosis.  Right renal artery stenosis was in the range of 1-59%.  She was felt to have normal patency to right renal artery. Her left kidney measured 1.1 cm smaller than her right.  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  There was a decrease in right renal artery velocities compared to prior exam and now these are in the normal range.  In the past she has issues with stage II hypertension.  I reviewed her recent hospitalization when she presented following a fall and was found to have significant  hypokalemia with potassium of 2.2 and magnesium of 2.4.  Repeat renal ultrasound showed cysts and mild left renal artery stenosis and on MRI imaging renal cysts appear to be benign but there were multiple splenic lesions there were thought to be secondary to granulomatous disease.  On exam today she is now in atrial fibrillation of questionable duration.  I am recommending initiation of Eliquis 5 mg twice a day.  I am discontinuing amlodipine and will start her on Cardizem CD 180 mg for improved rate control particularly with her ventricular rate at 117 bpm.  She is on beta-blocker therapy with metoprolol 50 twice daily and this will be slightly titrated to 75 mg twice daily.  I am scheduling her to have an echo Doppler study.  Repeat laboratory including chemistry TSH magnesium and CBC will be obtained.  Her daughter brought with her today advanced directives for no CODE STATUS which I signed.  I will see her in 3 to 4 weeks for  reevaluation.   Time spent: 30 minutes Troy Sine, MD, [facility]  07/29/2018 7:20 PM

## 2018-07-27 NOTE — Patient Instructions (Signed)
Medication Instructions:  START Eliquis 5 mg two times daily START diltiazem (Cardizem) 180 mg daily INCREASE metoprolol tartrate (Lopressor) to 75 mg two times daily STOP amlodipine  If you need a refill on your cardiac medications before your next appointment, please call your pharmacy.   Lab work: Return for lab work this week (CMET, TSH, CBC, Mag) If you have labs (blood work) drawn today and your tests are completely normal, you will receive your results only by: Marland Kitchen MyChart Message (if you have MyChart) OR . A paper copy in the mail If you have any lab test that is abnormal or we need to change your treatment, we will call you to review the results.  Testing/Procedures: Your physician has requested that you have an echocardiogram. Echocardiography is a painless test that uses sound waves to create images of your heart. It provides your doctor with information about the size and shape of your heart and how well your heart's chambers and valves are working. This procedure takes approximately one hour. There are no restrictions for this procedure.  This will be done at our [facility] location:  [place]: At [facility], you and your health needs are our priority.  As part of our continuing mission to provide you with exceptional heart care, we have created designated Provider Care Teams.  These Care Teams include your primary Cardiologist (physician) and Advanced Practice Providers (APPs -  Physician Assistants and Nurse Practitioners) who all work together to provide you with the care you need, when you need it. You will need a follow up appointment in 4-6 weeks.  Please call our office 2 months in advance to schedule this appointment.  You may see  Dr. Claiborne Billings or one of the following Advanced Practice Providers on your designated Care Team: [place], [place] . Fabian Sharp, PA-C

## 2018-07-29 ENCOUNTER — Encounter: Payer: Self-pay | Admitting: Cardiovascular Disease

## 2018-07-29 ENCOUNTER — Other Ambulatory Visit: Payer: Self-pay | Admitting: Cardiovascular Disease

## 2018-07-29 NOTE — Telephone Encounter (Signed)
Order Providers   Prescribing Provider Encounter Provider  Troy Sine, MD Troy Sine, MD  Outpatient Medication Detail    Disp Refills Start End   metoprolol tartrate 75 MG TABS 120 tablet 3 07/27/2018    Sig - Route: Take 75 mg by mouth 2 (two) times daily. - Oral   Sent to pharmacy as: metoprolol tartrate 75 MG Tab   E-Prescribing Status: Receipt confirmed by pharmacy (07/27/2018 3:13 PM EDT)   Pharmacy   [id number] - [place],  - [address]. AT [address]

## 2018-07-30 DIAGNOSIS — Z79899 Other long term (current) drug therapy: Secondary | ICD-10-CM | POA: Diagnosis not present

## 2018-07-30 DIAGNOSIS — I4891 Unspecified atrial fibrillation: Secondary | ICD-10-CM | POA: Diagnosis not present

## 2018-07-31 LAB — CBC
Hematocrit: 40.2 % (ref 34.0–46.6)
Hemoglobin: 13.3 g/dL (ref 11.1–15.9)
MCH: 29.9 pg (ref 26.6–33.0)
MCHC: 33.1 g/dL (ref 31.5–35.7)
MCV: 90 fL (ref 79–97)
Platelets: 272 10*3/uL (ref 150–450)
RBC: 4.45 x10E6/uL (ref 3.77–5.28)
RDW: 12.4 % (ref 12.3–15.4)
WBC: 7.6 10*3/uL (ref 3.4–10.8)

## 2018-07-31 LAB — COMPREHENSIVE METABOLIC PANEL
ALT: 14 IU/L (ref 0–32)
AST: 31 IU/L (ref 0–40)
Albumin/Globulin Ratio: 1.6 (ref 1.2–2.2)
Albumin: 4.6 g/dL (ref 3.5–4.8)
Alkaline Phosphatase: 79 IU/L (ref 39–117)
BUN/Creatinine Ratio: 14 (ref 12–28)
BUN: 14 mg/dL (ref 8–27)
Bilirubin Total: 1 mg/dL (ref 0.0–1.2)
CO2: 17 mmol/L — ABNORMAL LOW (ref 20–29)
Calcium: 10.4 mg/dL — ABNORMAL HIGH (ref 8.7–10.3)
Chloride: 100 mmol/L (ref 96–106)
Creatinine, Ser: 0.98 mg/dL (ref 0.57–1.00)
GFR calc Af Amer: 65 mL/min/{1.73_m2} (ref >59)
GFR calc non Af Amer: 56 mL/min/{1.73_m2} — ABNORMAL LOW (ref >59)
Globulin, Total: 2.9 g/dL (ref 1.5–4.5)
Glucose: 102 mg/dL — ABNORMAL HIGH (ref 65–99)
Potassium: 3.5 mmol/L (ref 3.5–5.2)
Sodium: 144 mmol/L (ref 134–144)
Total Protein: 7.5 g/dL (ref 6.0–8.5)

## 2018-07-31 LAB — MAGNESIUM: Magnesium: 2.2 mg/dL (ref 1.6–2.3)

## 2018-07-31 LAB — TSH: TSH: 6.09 u[IU]/mL — ABNORMAL HIGH (ref 0.450–4.500)

## 2018-08-02 ENCOUNTER — Other Ambulatory Visit: Payer: Self-pay | Admitting: Cardiovascular Disease

## 2018-08-16 ENCOUNTER — Ambulatory Visit (HOSPITAL_COMMUNITY): Payer: PPO | Attending: Cardiovascular Disease

## 2018-08-16 ENCOUNTER — Other Ambulatory Visit: Payer: Self-pay

## 2018-08-16 DIAGNOSIS — I4891 Unspecified atrial fibrillation: Secondary | ICD-10-CM

## 2018-08-30 ENCOUNTER — Ambulatory Visit: Payer: PPO | Admitting: Physician Assistant

## 2018-08-31 ENCOUNTER — Telehealth: Payer: Self-pay | Admitting: *Deleted

## 2018-08-31 NOTE — Telephone Encounter (Signed)
lmtcb  Needs appt rescheduled as well.

## 2018-08-31 NOTE — Telephone Encounter (Signed)
-----   Message from Troy Sine, MD sent at 08/22/2018  9:36 PM EST ----- Mild LVH with normal systolic function.  Significant mitral annular calcification with moderate MR; moderate LA dilation.  Mild pulmonary hypertension

## 2018-09-08 ENCOUNTER — Emergency Department (HOSPITAL_COMMUNITY)
Admission: EM | Admit: 2018-09-08 | Discharge: 2018-09-08 | Disposition: A | Discharge status: Home / Self Care | Payer: PPO | Source: Home / Self Care | Attending: Emergency Medicine | Admitting: Emergency Medicine

## 2018-09-08 ENCOUNTER — Emergency Department (HOSPITAL_COMMUNITY): Payer: PPO

## 2018-09-08 ENCOUNTER — Encounter (HOSPITAL_COMMUNITY): Payer: Self-pay | Admitting: Emergency Medicine

## 2018-09-08 DIAGNOSIS — F0390 Unspecified dementia without behavioral disturbance: Secondary | ICD-10-CM

## 2018-09-08 DIAGNOSIS — N189 Chronic kidney disease, unspecified: Secondary | ICD-10-CM | POA: Diagnosis not present

## 2018-09-08 DIAGNOSIS — Z111 Encounter for screening for respiratory tuberculosis: Secondary | ICD-10-CM | POA: Diagnosis not present

## 2018-09-08 DIAGNOSIS — I248 Other forms of acute ischemic heart disease: Secondary | ICD-10-CM | POA: Diagnosis not present

## 2018-09-08 DIAGNOSIS — Y998 Other external cause status: Secondary | ICD-10-CM | POA: Insufficient documentation

## 2018-09-08 DIAGNOSIS — R4182 Altered mental status, unspecified: Secondary | ICD-10-CM | POA: Diagnosis not present

## 2018-09-08 DIAGNOSIS — Y92410 Unspecified street and highway as the place of occurrence of the external cause: Secondary | ICD-10-CM | POA: Insufficient documentation

## 2018-09-08 DIAGNOSIS — R220 Localized swelling, mass and lump, head: Secondary | ICD-10-CM | POA: Diagnosis not present

## 2018-09-08 DIAGNOSIS — I4891 Unspecified atrial fibrillation: Secondary | ICD-10-CM

## 2018-09-08 DIAGNOSIS — R001 Bradycardia, unspecified: Secondary | ICD-10-CM | POA: Diagnosis not present

## 2018-09-08 DIAGNOSIS — E876 Hypokalemia: Secondary | ICD-10-CM | POA: Diagnosis not present

## 2018-09-08 DIAGNOSIS — M6281 Muscle weakness (generalized): Secondary | ICD-10-CM | POA: Diagnosis not present

## 2018-09-08 DIAGNOSIS — Z79899 Other long term (current) drug therapy: Secondary | ICD-10-CM

## 2018-09-08 DIAGNOSIS — Y939 Activity, unspecified: Secondary | ICD-10-CM | POA: Insufficient documentation

## 2018-09-08 DIAGNOSIS — R52 Pain, unspecified: Secondary | ICD-10-CM | POA: Diagnosis not present

## 2018-09-08 DIAGNOSIS — S60222A Contusion of left hand, initial encounter: Secondary | ICD-10-CM | POA: Insufficient documentation

## 2018-09-08 DIAGNOSIS — R32 Unspecified urinary incontinence: Secondary | ICD-10-CM | POA: Diagnosis not present

## 2018-09-08 DIAGNOSIS — R41841 Cognitive communication deficit: Secondary | ICD-10-CM | POA: Diagnosis not present

## 2018-09-08 DIAGNOSIS — I499 Cardiac arrhythmia, unspecified: Secondary | ICD-10-CM | POA: Diagnosis not present

## 2018-09-08 DIAGNOSIS — Z7401 Bed confinement status: Secondary | ICD-10-CM | POA: Diagnosis not present

## 2018-09-08 DIAGNOSIS — I701 Atherosclerosis of renal artery: Secondary | ICD-10-CM | POA: Diagnosis not present

## 2018-09-08 DIAGNOSIS — R5381 Other malaise: Secondary | ICD-10-CM | POA: Diagnosis not present

## 2018-09-08 DIAGNOSIS — J939 Pneumothorax, unspecified: Secondary | ICD-10-CM | POA: Diagnosis not present

## 2018-09-08 DIAGNOSIS — I10 Essential (primary) hypertension: Secondary | ICD-10-CM | POA: Insufficient documentation

## 2018-09-08 DIAGNOSIS — Z66 Do not resuscitate: Secondary | ICD-10-CM | POA: Diagnosis not present

## 2018-09-08 DIAGNOSIS — R040 Epistaxis: Secondary | ICD-10-CM | POA: Diagnosis not present

## 2018-09-08 DIAGNOSIS — R609 Edema, unspecified: Secondary | ICD-10-CM | POA: Diagnosis not present

## 2018-09-08 DIAGNOSIS — E039 Hypothyroidism, unspecified: Secondary | ICD-10-CM

## 2018-09-08 DIAGNOSIS — D72829 Elevated white blood cell count, unspecified: Secondary | ICD-10-CM | POA: Diagnosis not present

## 2018-09-08 DIAGNOSIS — R404 Transient alteration of awareness: Secondary | ICD-10-CM | POA: Diagnosis not present

## 2018-09-08 DIAGNOSIS — S199XXA Unspecified injury of neck, initial encounter: Secondary | ICD-10-CM | POA: Diagnosis not present

## 2018-09-08 DIAGNOSIS — N390 Urinary tract infection, site not specified: Secondary | ICD-10-CM | POA: Diagnosis not present

## 2018-09-08 DIAGNOSIS — R2689 Other abnormalities of gait and mobility: Secondary | ICD-10-CM | POA: Diagnosis not present

## 2018-09-08 DIAGNOSIS — R58 Hemorrhage, not elsewhere classified: Secondary | ICD-10-CM | POA: Diagnosis not present

## 2018-09-08 DIAGNOSIS — Z7901 Long term (current) use of anticoagulants: Secondary | ICD-10-CM | POA: Insufficient documentation

## 2018-09-08 DIAGNOSIS — M7989 Other specified soft tissue disorders: Secondary | ICD-10-CM | POA: Diagnosis not present

## 2018-09-08 DIAGNOSIS — E785 Hyperlipidemia, unspecified: Secondary | ICD-10-CM

## 2018-09-08 DIAGNOSIS — N183 Chronic kidney disease, stage 3 (moderate): Secondary | ICD-10-CM | POA: Diagnosis not present

## 2018-09-08 DIAGNOSIS — F0150 Vascular dementia without behavioral disturbance: Secondary | ICD-10-CM | POA: Diagnosis not present

## 2018-09-08 DIAGNOSIS — I4819 Other persistent atrial fibrillation: Secondary | ICD-10-CM | POA: Diagnosis not present

## 2018-09-08 DIAGNOSIS — M2550 Pain in unspecified joint: Secondary | ICD-10-CM | POA: Diagnosis not present

## 2018-09-08 DIAGNOSIS — K5900 Constipation, unspecified: Secondary | ICD-10-CM | POA: Diagnosis not present

## 2018-09-08 DIAGNOSIS — S0990XA Unspecified injury of head, initial encounter: Secondary | ICD-10-CM | POA: Diagnosis not present

## 2018-09-08 DIAGNOSIS — Z888 Allergy status to other drugs, medicaments and biological substances status: Secondary | ICD-10-CM | POA: Diagnosis not present

## 2018-09-08 DIAGNOSIS — R7989 Other specified abnormal findings of blood chemistry: Secondary | ICD-10-CM | POA: Diagnosis not present

## 2018-09-08 DIAGNOSIS — Z9181 History of falling: Secondary | ICD-10-CM | POA: Diagnosis not present

## 2018-09-08 DIAGNOSIS — N289 Disorder of kidney and ureter, unspecified: Secondary | ICD-10-CM

## 2018-09-08 DIAGNOSIS — I1310 Hypertensive heart and chronic kidney disease without heart failure, with stage 1 through stage 4 chronic kidney disease, or unspecified chronic kidney disease: Secondary | ICD-10-CM | POA: Diagnosis not present

## 2018-09-08 DIAGNOSIS — K589 Irritable bowel syndrome without diarrhea: Secondary | ICD-10-CM | POA: Diagnosis not present

## 2018-09-08 DIAGNOSIS — R269 Unspecified abnormalities of gait and mobility: Secondary | ICD-10-CM | POA: Diagnosis not present

## 2018-09-08 DIAGNOSIS — Z7189 Other specified counseling: Secondary | ICD-10-CM | POA: Diagnosis not present

## 2018-09-08 DIAGNOSIS — R9431 Abnormal electrocardiogram [ECG] [EKG]: Secondary | ICD-10-CM | POA: Diagnosis not present

## 2018-09-08 DIAGNOSIS — G9341 Metabolic encephalopathy: Secondary | ICD-10-CM | POA: Diagnosis not present

## 2018-09-08 DIAGNOSIS — I739 Peripheral vascular disease, unspecified: Secondary | ICD-10-CM | POA: Diagnosis not present

## 2018-09-08 DIAGNOSIS — Z515 Encounter for palliative care: Secondary | ICD-10-CM | POA: Diagnosis not present

## 2018-09-08 DIAGNOSIS — I4892 Unspecified atrial flutter: Secondary | ICD-10-CM | POA: Diagnosis not present

## 2018-09-08 DIAGNOSIS — K219 Gastro-esophageal reflux disease without esophagitis: Secondary | ICD-10-CM | POA: Diagnosis not present

## 2018-09-08 DIAGNOSIS — I480 Paroxysmal atrial fibrillation: Secondary | ICD-10-CM | POA: Diagnosis not present

## 2018-09-08 DIAGNOSIS — E873 Alkalosis: Secondary | ICD-10-CM | POA: Diagnosis not present

## 2018-09-08 DIAGNOSIS — T148XXA Other injury of unspecified body region, initial encounter: Secondary | ICD-10-CM

## 2018-09-08 LAB — CBG MONITORING, ED: Glucose-Capillary: 77 mg/dL (ref 70–99)

## 2018-09-08 NOTE — ED Notes (Addendum)
Patient verbalizes understanding of discharge instructions. Opportunity for questioning and answers were provided. Armband removed by staff, pt discharged from ED. Pt wheled to lobby. Pt and husband awaiting ride from son in lobby,.
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
## 2018-09-08 NOTE — ED Notes (Signed)
Pt. Did not fall, I assisted pt throughout ambulation, bathroom visit, and return to bed.

## 2018-09-08 NOTE — ED Notes (Signed)
Found pt sitting at the end of bed, with her C-collar on the floor and wanting to go to the bathroom (hx of dementia). Assisted pt to bathroom, stayed with pt while urinating. Once leaving bathroom, pt become very dizzy, started wobbling, having some knee buckling events. While holding on to her sat her in hallway bed.  After sitting down, pt, become glossy eyed, had a near syncopal event. After laying down flat in bed, pt become alert and oriented after about 5-10 secs, asked her some questions, she responded appropriately. Rechecked vitals and checked CBG levels all were normal.

## 2018-09-09 ENCOUNTER — Emergency Department (HOSPITAL_COMMUNITY): Payer: PPO

## 2018-09-09 ENCOUNTER — Inpatient Hospital Stay (HOSPITAL_COMMUNITY)
Admission: EM | Admit: 2018-09-09 | Discharge: 2018-09-18 | DRG: 308 | Disposition: A | Discharge status: Skilled Nursing Facility | Payer: PPO | Attending: Internal Medicine | Admitting: Internal Medicine

## 2018-09-09 DIAGNOSIS — M7989 Other specified soft tissue disorders: Secondary | ICD-10-CM | POA: Diagnosis present

## 2018-09-09 DIAGNOSIS — Z7189 Other specified counseling: Secondary | ICD-10-CM | POA: Diagnosis not present

## 2018-09-09 DIAGNOSIS — E039 Hypothyroidism, unspecified: Secondary | ICD-10-CM | POA: Diagnosis present

## 2018-09-09 DIAGNOSIS — R269 Unspecified abnormalities of gait and mobility: Secondary | ICD-10-CM | POA: Diagnosis present

## 2018-09-09 DIAGNOSIS — I4892 Unspecified atrial flutter: Secondary | ICD-10-CM | POA: Diagnosis present

## 2018-09-09 DIAGNOSIS — Z515 Encounter for palliative care: Secondary | ICD-10-CM | POA: Diagnosis not present

## 2018-09-09 DIAGNOSIS — F0150 Vascular dementia without behavioral disturbance: Secondary | ICD-10-CM | POA: Diagnosis not present

## 2018-09-09 DIAGNOSIS — Z823 Family history of stroke: Secondary | ICD-10-CM

## 2018-09-09 DIAGNOSIS — I4891 Unspecified atrial fibrillation: Secondary | ICD-10-CM

## 2018-09-09 DIAGNOSIS — R001 Bradycardia, unspecified: Secondary | ICD-10-CM | POA: Diagnosis present

## 2018-09-09 DIAGNOSIS — K589 Irritable bowel syndrome without diarrhea: Secondary | ICD-10-CM | POA: Diagnosis present

## 2018-09-09 DIAGNOSIS — R32 Unspecified urinary incontinence: Secondary | ICD-10-CM | POA: Diagnosis present

## 2018-09-09 DIAGNOSIS — I739 Peripheral vascular disease, unspecified: Secondary | ICD-10-CM | POA: Diagnosis present

## 2018-09-09 DIAGNOSIS — E876 Hypokalemia: Secondary | ICD-10-CM | POA: Diagnosis present

## 2018-09-09 DIAGNOSIS — N390 Urinary tract infection, site not specified: Secondary | ICD-10-CM | POA: Diagnosis present

## 2018-09-09 DIAGNOSIS — N183 Chronic kidney disease, stage 3 (moderate): Secondary | ICD-10-CM | POA: Diagnosis present

## 2018-09-09 DIAGNOSIS — I482 Chronic atrial fibrillation, unspecified: Secondary | ICD-10-CM

## 2018-09-09 DIAGNOSIS — E873 Alkalosis: Secondary | ICD-10-CM | POA: Diagnosis present

## 2018-09-09 DIAGNOSIS — I1310 Hypertensive heart and chronic kidney disease without heart failure, with stage 1 through stage 4 chronic kidney disease, or unspecified chronic kidney disease: Secondary | ICD-10-CM | POA: Diagnosis present

## 2018-09-09 DIAGNOSIS — Z888 Allergy status to other drugs, medicaments and biological substances status: Secondary | ICD-10-CM

## 2018-09-09 DIAGNOSIS — I4819 Other persistent atrial fibrillation: Secondary | ICD-10-CM | POA: Diagnosis not present

## 2018-09-09 DIAGNOSIS — R7989 Other specified abnormal findings of blood chemistry: Secondary | ICD-10-CM | POA: Diagnosis present

## 2018-09-09 DIAGNOSIS — Z66 Do not resuscitate: Secondary | ICD-10-CM | POA: Diagnosis present

## 2018-09-09 DIAGNOSIS — R5381 Other malaise: Secondary | ICD-10-CM | POA: Diagnosis present

## 2018-09-09 DIAGNOSIS — K219 Gastro-esophageal reflux disease without esophagitis: Secondary | ICD-10-CM | POA: Diagnosis present

## 2018-09-09 DIAGNOSIS — I701 Atherosclerosis of renal artery: Secondary | ICD-10-CM | POA: Diagnosis present

## 2018-09-09 DIAGNOSIS — G9341 Metabolic encephalopathy: Secondary | ICD-10-CM | POA: Diagnosis present

## 2018-09-09 DIAGNOSIS — Z8249 Family history of ischemic heart disease and other diseases of the circulatory system: Secondary | ICD-10-CM

## 2018-09-09 DIAGNOSIS — F0390 Unspecified dementia without behavioral disturbance: Secondary | ICD-10-CM | POA: Diagnosis present

## 2018-09-09 DIAGNOSIS — I480 Paroxysmal atrial fibrillation: Principal | ICD-10-CM | POA: Diagnosis present

## 2018-09-09 DIAGNOSIS — E785 Hyperlipidemia, unspecified: Secondary | ICD-10-CM | POA: Diagnosis present

## 2018-09-09 DIAGNOSIS — I10 Essential (primary) hypertension: Secondary | ICD-10-CM | POA: Diagnosis present

## 2018-09-09 DIAGNOSIS — Z7901 Long term (current) use of anticoagulants: Secondary | ICD-10-CM

## 2018-09-09 DIAGNOSIS — Z79890 Hormone replacement therapy (postmenopausal): Secondary | ICD-10-CM

## 2018-09-09 DIAGNOSIS — W19XXXA Unspecified fall, initial encounter: Secondary | ICD-10-CM | POA: Diagnosis present

## 2018-09-09 DIAGNOSIS — I248 Other forms of acute ischemic heart disease: Secondary | ICD-10-CM | POA: Diagnosis present

## 2018-09-09 DIAGNOSIS — D72829 Elevated white blood cell count, unspecified: Secondary | ICD-10-CM

## 2018-09-09 DIAGNOSIS — Z79899 Other long term (current) drug therapy: Secondary | ICD-10-CM

## 2018-09-09 HISTORY — DX: Paroxysmal atrial fibrillation: I48.0

## 2018-09-09 LAB — COMPREHENSIVE METABOLIC PANEL
ALT: 33 U/L (ref 0–44)
AST: 45 U/L — ABNORMAL HIGH (ref 15–41)
Albumin: 3.9 g/dL (ref 3.5–5.0)
Alkaline Phosphatase: 77 U/L (ref 38–126)
Anion gap: 19 — ABNORMAL HIGH (ref 5–15)
BUN: 21 mg/dL (ref 8–23)
CO2: 23 mmol/L (ref 22–32)
Calcium: 9.9 mg/dL (ref 8.9–10.3)
Chloride: 101 mmol/L (ref 98–111)
Creatinine, Ser: 1.32 mg/dL — ABNORMAL HIGH (ref 0.44–1.00)
GFR calc Af Amer: 45 mL/min — ABNORMAL LOW (ref >60)
GFR calc non Af Amer: 39 mL/min — ABNORMAL LOW (ref >60)
Glucose, Bld: 139 mg/dL — ABNORMAL HIGH (ref 70–99)
Potassium: 3.2 mmol/L — ABNORMAL LOW (ref 3.5–5.1)
Sodium: 143 mmol/L (ref 135–145)
Total Bilirubin: 1.8 mg/dL — ABNORMAL HIGH (ref 0.3–1.2)
Total Protein: 7.2 g/dL (ref 6.5–8.1)

## 2018-09-09 LAB — URINALYSIS, ROUTINE W REFLEX MICROSCOPIC
Bilirubin Urine: NEGATIVE
Glucose, UA: NEGATIVE mg/dL
Ketones, ur: 5 mg/dL — AB
Leukocytes, UA: NEGATIVE
Nitrite: NEGATIVE
Protein, ur: 100 mg/dL — AB
Specific Gravity, Urine: 1.017 (ref 1.005–1.030)
pH: 5 (ref 5.0–8.0)

## 2018-09-09 LAB — TROPONIN I: Troponin I: 0.57 ng/mL (ref <0.03)

## 2018-09-09 LAB — CBC WITH DIFFERENTIAL/PLATELET
Abs Immature Granulocytes: 0.06 10*3/uL (ref 0.00–0.07)
Basophils Absolute: 0.1 10*3/uL (ref 0.0–0.1)
Basophils Relative: 0 %
Eosinophils Absolute: 0.1 10*3/uL (ref 0.0–0.5)
Eosinophils Relative: 0 %
HCT: 50 % — ABNORMAL HIGH (ref 36.0–46.0)
Hemoglobin: 15.5 g/dL — ABNORMAL HIGH (ref 12.0–15.0)
Immature Granulocytes: 0 %
Lymphocytes Relative: 12 %
Lymphs Abs: 1.7 10*3/uL (ref 0.7–4.0)
MCH: 27.2 pg (ref 26.0–34.0)
MCHC: 31 g/dL (ref 30.0–36.0)
MCV: 87.7 fL (ref 80.0–100.0)
Monocytes Absolute: 0.4 10*3/uL (ref 0.1–1.0)
Monocytes Relative: 3 %
Neutro Abs: 12.3 10*3/uL — ABNORMAL HIGH (ref 1.7–7.7)
Neutrophils Relative %: 85 %
Platelets: 274 10*3/uL (ref 150–400)
RBC: 5.7 MIL/uL — ABNORMAL HIGH (ref 3.87–5.11)
RDW: 13.7 % (ref 11.5–15.5)
WBC: 14.6 10*3/uL — ABNORMAL HIGH (ref 4.0–10.5)
nRBC: 0 % (ref 0.0–0.2)

## 2018-09-09 LAB — TSH: TSH: 2.687 u[IU]/mL (ref 0.350–4.500)

## 2018-09-09 MED ORDER — APIXABAN 5 MG PO TABS
5.0000 mg | ORAL_TABLET | Freq: Two times a day (BID) | ORAL | Status: DC
  Administered 2018-09-09 – 2018-09-18 (×18): 5 mg via ORAL
  Filled 2018-09-09 (×18): qty 1

## 2018-09-09 MED ORDER — ACETAMINOPHEN 325 MG PO TABS
650.0000 mg | ORAL_TABLET | ORAL | Status: DC | PRN
  Administered 2018-09-12: 650 mg via ORAL
  Filled 2018-09-09: qty 2

## 2018-09-09 MED ORDER — SODIUM CHLORIDE 0.9 % IV SOLN
1.0000 g | INTRAVENOUS | Status: DC
  Administered 2018-09-10 – 2018-09-11 (×2): 1 g via INTRAVENOUS
  Filled 2018-09-09 (×2): qty 10

## 2018-09-09 MED ORDER — SODIUM CHLORIDE 0.9 % IV SOLN
1.0000 g | Freq: Once | INTRAVENOUS | Status: AC
  Administered 2018-09-09: 1 g via INTRAVENOUS
  Filled 2018-09-09: qty 10

## 2018-09-09 MED ORDER — APIXABAN 2.5 MG PO TABS: 2.5000 mg | ORAL_TABLET | Freq: Two times a day (BID) | ORAL | Status: DC

## 2018-09-09 MED ORDER — LEVOTHYROXINE SODIUM 75 MCG PO TABS
75.0000 ug | ORAL_TABLET | Freq: Every day | ORAL | Status: DC
  Administered 2018-09-10 – 2018-09-18 (×9): 75 ug via ORAL
  Filled 2018-09-09 (×9): qty 1

## 2018-09-09 MED ORDER — METOPROLOL TARTRATE 50 MG PO TABS
75.0000 mg | ORAL_TABLET | Freq: Two times a day (BID) | ORAL | Status: DC
  Administered 2018-09-09 – 2018-09-13 (×8): 75 mg via ORAL
  Filled 2018-09-09 (×8): qty 2

## 2018-09-09 MED ORDER — DONEPEZIL HCL 5 MG PO TABS
5.0000 mg | ORAL_TABLET | Freq: Every day | ORAL | Status: DC
  Administered 2018-09-10 – 2018-09-18 (×9): 5 mg via ORAL
  Filled 2018-09-09 (×9): qty 1

## 2018-09-09 MED ORDER — POTASSIUM CHLORIDE IN NACL 20-0.9 MEQ/L-% IV SOLN
Freq: Once | INTRAVENOUS | Status: AC
  Administered 2018-09-09: 17:00:00 via INTRAVENOUS
  Filled 2018-09-09: qty 1000

## 2018-09-09 MED ORDER — DILTIAZEM LOAD VIA INFUSION
5.0000 mg | Freq: Once | INTRAVENOUS | Status: AC
  Administered 2018-09-09: 5 mg via INTRAVENOUS
  Filled 2018-09-09: qty 5

## 2018-09-09 MED ORDER — MEMANTINE HCL 10 MG PO TABS
10.0000 mg | ORAL_TABLET | Freq: Two times a day (BID) | ORAL | Status: DC
  Administered 2018-09-09 – 2018-09-18 (×18): 10 mg via ORAL
  Filled 2018-09-09: qty 2
  Filled 2018-09-09 (×3): qty 1
  Filled 2018-09-09: qty 2
  Filled 2018-09-09 (×2): qty 1
  Filled 2018-09-09: qty 2
  Filled 2018-09-09: qty 1
  Filled 2018-09-09 (×2): qty 2
  Filled 2018-09-09 (×6): qty 1
  Filled 2018-09-09: qty 2
  Filled 2018-09-09: qty 1
  Filled 2018-09-09 (×2): qty 2
  Filled 2018-09-09 (×4): qty 1
  Filled 2018-09-09: qty 2
  Filled 2018-09-09 (×2): qty 1
  Filled 2018-09-09: qty 2

## 2018-09-09 MED ORDER — DILTIAZEM HCL-DEXTROSE 100-5 MG/100ML-% IV SOLN (PREMIX)
5.0000 mg/h | INTRAVENOUS | Status: AC
  Administered 2018-09-09: 5 mg/h via INTRAVENOUS
  Filled 2018-09-09: qty 100

## 2018-09-09 MED ORDER — EZETIMIBE 10 MG PO TABS
10.0000 mg | ORAL_TABLET | Freq: Every day | ORAL | Status: DC
  Administered 2018-09-10 – 2018-09-18 (×9): 10 mg via ORAL
  Filled 2018-09-09 (×9): qty 1

## 2018-09-09 MED ORDER — DILTIAZEM HCL-DEXTROSE 100-5 MG/100ML-% IV SOLN (PREMIX)
5.0000 mg/h | INTRAVENOUS | Status: DC
  Administered 2018-09-10: 15 mg/h via INTRAVENOUS
  Filled 2018-09-09 (×2): qty 100

## 2018-09-09 MED ORDER — ONDANSETRON HCL 4 MG/2ML IJ SOLN: 4.0000 mg | Freq: Four times a day (QID) | INTRAMUSCULAR | Status: DC | PRN

## 2018-09-09 NOTE — ED Notes (Signed)
Admitting at bedside 
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
## 2018-09-09 NOTE — ED Notes (Signed)
MD informed about troponin

## 2018-09-09 NOTE — ED Notes (Signed)
Got patient undress on the monitor did ekg shown to Dr Alvino Chapel did in and out cath to get urine patient is resting with call bell in reach
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
## 2018-09-09 NOTE — ED Notes (Signed)
Patient transported to CT 
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
## 2018-09-10 ENCOUNTER — Encounter (HOSPITAL_COMMUNITY): Payer: Self-pay | Admitting: *Deleted

## 2018-09-10 ENCOUNTER — Other Ambulatory Visit: Payer: Self-pay

## 2018-09-10 LAB — TROPONIN I
Troponin I: 0.56 ng/mL (ref <0.03)
Troponin I: 0.54 ng/mL (ref <0.03)

## 2018-09-10 LAB — BASIC METABOLIC PANEL
Anion gap: 13 (ref 5–15)
BUN: 23 mg/dL (ref 8–23)
CO2: 25 mmol/L (ref 22–32)
Calcium: 9.1 mg/dL (ref 8.9–10.3)
Chloride: 106 mmol/L (ref 98–111)
Creatinine, Ser: 1.17 mg/dL — ABNORMAL HIGH (ref 0.44–1.00)
GFR calc Af Amer: 52 mL/min — ABNORMAL LOW (ref >60)
GFR calc non Af Amer: 45 mL/min — ABNORMAL LOW (ref >60)
Glucose, Bld: 128 mg/dL — ABNORMAL HIGH (ref 70–99)
Potassium: 2.8 mmol/L — ABNORMAL LOW (ref 3.5–5.1)
Sodium: 144 mmol/L (ref 135–145)

## 2018-09-10 LAB — MRSA PCR SCREENING: MRSA by PCR: NEGATIVE

## 2018-09-10 LAB — MAGNESIUM: Magnesium: 1.8 mg/dL (ref 1.7–2.4)

## 2018-09-10 MED ORDER — DILTIAZEM HCL ER COATED BEADS 180 MG PO CP24
180.0000 mg | ORAL_CAPSULE | Freq: Every day | ORAL | Status: DC
  Administered 2018-09-10: 180 mg via ORAL
  Filled 2018-09-10: qty 1

## 2018-09-10 MED ORDER — MAGNESIUM OXIDE 400 (241.3 MG) MG PO TABS
400.0000 mg | ORAL_TABLET | Freq: Two times a day (BID) | ORAL | Status: DC
  Administered 2018-09-10 – 2018-09-18 (×16): 400 mg via ORAL
  Filled 2018-09-10 (×16): qty 1

## 2018-09-10 MED ORDER — POTASSIUM CHLORIDE CRYS ER 20 MEQ PO TBCR
40.0000 meq | EXTENDED_RELEASE_TABLET | Freq: Two times a day (BID) | ORAL | Status: DC
  Administered 2018-09-10 – 2018-09-13 (×8): 40 meq via ORAL
  Filled 2018-09-10 (×8): qty 2

## 2018-09-10 NOTE — Progress Notes (Signed)
Reported by CCMD that pt had a 2.26 seconds pause.  Pt is asleep in bed.  Afib in 80-90s.  Idolina Primer, RN
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
## 2018-09-10 NOTE — Progress Notes (Signed)
Pt had incontinence episode x1 this shift and 60mL urin output.  Bladder scan showed 288 mL.   Idolina Primer, RN
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
## 2018-09-11 LAB — CBC WITH DIFFERENTIAL/PLATELET
Abs Immature Granulocytes: 0.03 10*3/uL (ref 0.00–0.07)
Basophils Absolute: 0.1 10*3/uL (ref 0.0–0.1)
Basophils Relative: 1 %
Eosinophils Absolute: 0.6 10*3/uL — ABNORMAL HIGH (ref 0.0–0.5)
Eosinophils Relative: 6 %
HCT: 40.3 % (ref 36.0–46.0)
Hemoglobin: 12.6 g/dL (ref 12.0–15.0)
Immature Granulocytes: 0 %
Lymphocytes Relative: 20 %
Lymphs Abs: 1.9 10*3/uL (ref 0.7–4.0)
MCH: 27.2 pg (ref 26.0–34.0)
MCHC: 31.3 g/dL (ref 30.0–36.0)
MCV: 87 fL (ref 80.0–100.0)
Monocytes Absolute: 0.7 10*3/uL (ref 0.1–1.0)
Monocytes Relative: 7 %
Neutro Abs: 6.3 10*3/uL (ref 1.7–7.7)
Neutrophils Relative %: 66 %
Platelets: 200 10*3/uL (ref 150–400)
RBC: 4.63 MIL/uL (ref 3.87–5.11)
RDW: 14 % (ref 11.5–15.5)
WBC: 9.5 10*3/uL (ref 4.0–10.5)
nRBC: 0 % (ref 0.0–0.2)

## 2018-09-11 LAB — RENAL FUNCTION PANEL
Albumin: 3.2 g/dL — ABNORMAL LOW (ref 3.5–5.0)
Anion gap: 12 (ref 5–15)
BUN: 27 mg/dL — ABNORMAL HIGH (ref 8–23)
CO2: 25 mmol/L (ref 22–32)
Calcium: 9.4 mg/dL (ref 8.9–10.3)
Chloride: 108 mmol/L (ref 98–111)
Creatinine, Ser: 1.15 mg/dL — ABNORMAL HIGH (ref 0.44–1.00)
GFR calc Af Amer: 54 mL/min — ABNORMAL LOW (ref >60)
GFR calc non Af Amer: 46 mL/min — ABNORMAL LOW (ref >60)
Glucose, Bld: 121 mg/dL — ABNORMAL HIGH (ref 70–99)
Phosphorus: 2 mg/dL — ABNORMAL LOW (ref 2.5–4.6)
Potassium: 3.3 mmol/L — ABNORMAL LOW (ref 3.5–5.1)
Sodium: 145 mmol/L (ref 135–145)

## 2018-09-11 LAB — URINE CULTURE: Culture: 10000 — AB

## 2018-09-11 LAB — TROPONIN I: Troponin I: 0.48 ng/mL (ref <0.03)

## 2018-09-11 MED ORDER — DILTIAZEM HCL ER COATED BEADS 240 MG PO CP24
240.0000 mg | ORAL_CAPSULE | Freq: Every day | ORAL | Status: DC
  Administered 2018-09-11 – 2018-09-12 (×2): 240 mg via ORAL
  Filled 2018-09-11 (×3): qty 1
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
## 2018-09-12 LAB — CBC WITH DIFFERENTIAL/PLATELET
Abs Immature Granulocytes: 0.04 10*3/uL (ref 0.00–0.07)
Basophils Absolute: 0.1 10*3/uL (ref 0.0–0.1)
Basophils Relative: 1 %
Eosinophils Absolute: 0.5 10*3/uL (ref 0.0–0.5)
Eosinophils Relative: 6 %
HCT: 39.2 % (ref 36.0–46.0)
Hemoglobin: 12.4 g/dL (ref 12.0–15.0)
Immature Granulocytes: 1 %
Lymphocytes Relative: 24 %
Lymphs Abs: 1.9 10*3/uL (ref 0.7–4.0)
MCH: 27.8 pg (ref 26.0–34.0)
MCHC: 31.6 g/dL (ref 30.0–36.0)
MCV: 87.9 fL (ref 80.0–100.0)
Monocytes Absolute: 0.7 10*3/uL (ref 0.1–1.0)
Monocytes Relative: 8 %
Neutro Abs: 4.7 10*3/uL (ref 1.7–7.7)
Neutrophils Relative %: 60 %
Platelets: 208 10*3/uL (ref 150–400)
RBC: 4.46 MIL/uL (ref 3.87–5.11)
RDW: 13.9 % (ref 11.5–15.5)
WBC: 7.9 10*3/uL (ref 4.0–10.5)
nRBC: 0 % (ref 0.0–0.2)

## 2018-09-12 LAB — RENAL FUNCTION PANEL
Albumin: 3.2 g/dL — ABNORMAL LOW (ref 3.5–5.0)
Anion gap: 10 (ref 5–15)
BUN: 23 mg/dL (ref 8–23)
CO2: 25 mmol/L (ref 22–32)
Calcium: 9.3 mg/dL (ref 8.9–10.3)
Chloride: 112 mmol/L — ABNORMAL HIGH (ref 98–111)
Creatinine, Ser: 1.02 mg/dL — ABNORMAL HIGH (ref 0.44–1.00)
GFR calc Af Amer: >60 mL/min (ref >60)
GFR calc non Af Amer: 53 mL/min — ABNORMAL LOW (ref >60)
Glucose, Bld: 118 mg/dL — ABNORMAL HIGH (ref 70–99)
Phosphorus: 2.3 mg/dL — ABNORMAL LOW (ref 2.5–4.6)
Potassium: 4 mmol/L (ref 3.5–5.1)
Sodium: 147 mmol/L — ABNORMAL HIGH (ref 135–145)

## 2018-09-12 MED ORDER — DILTIAZEM HCL 60 MG PO TABS
60.0000 mg | ORAL_TABLET | Freq: Two times a day (BID) | ORAL | Status: DC
  Administered 2018-09-12 (×2): 60 mg via ORAL
  Filled 2018-09-12 (×2): qty 1
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
## 2018-09-13 MED ORDER — METOPROLOL TARTRATE 25 MG PO TABS
25.0000 mg | ORAL_TABLET | ORAL | Status: AC
  Administered 2018-09-13: 25 mg via ORAL
  Filled 2018-09-13: qty 1

## 2018-09-13 MED ORDER — METOPROLOL TARTRATE 25 MG PO TABS
125.0000 mg | ORAL_TABLET | Freq: Two times a day (BID) | ORAL | Status: DC
  Administered 2018-09-13 – 2018-09-18 (×10): 125 mg via ORAL
  Filled 2018-09-13 (×10): qty 1

## 2018-09-13 MED ORDER — DILTIAZEM HCL ER COATED BEADS 180 MG PO CP24
360.0000 mg | ORAL_CAPSULE | Freq: Every day | ORAL | Status: DC
  Administered 2018-09-13 – 2018-09-14 (×2): 360 mg via ORAL
  Filled 2018-09-13 (×2): qty 2

## 2018-09-13 NOTE — Care Management Important Message (Signed)
Important Message  Patient Details  Name: Lindsey Goodman MRN: [id number] Date of Birth: 1940/10/23   Medicare Important Message Given:  Yes    Barb Merino [place] 09/13/2018, 12:58 PM
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
## 2018-09-14 ENCOUNTER — Encounter (HOSPITAL_COMMUNITY): Payer: Self-pay | Admitting: Physician Assistant

## 2018-09-14 DIAGNOSIS — R7989 Other specified abnormal findings of blood chemistry: Secondary | ICD-10-CM

## 2018-09-14 DIAGNOSIS — I4819 Other persistent atrial fibrillation: Secondary | ICD-10-CM

## 2018-09-14 LAB — RENAL FUNCTION PANEL
Albumin: 3.4 g/dL — ABNORMAL LOW (ref 3.5–5.0)
Anion gap: 12 (ref 5–15)
BUN: 29 mg/dL — ABNORMAL HIGH (ref 8–23)
CO2: 22 mmol/L (ref 22–32)
Calcium: 9.8 mg/dL (ref 8.9–10.3)
Chloride: 110 mmol/L (ref 98–111)
Creatinine, Ser: 1.06 mg/dL — ABNORMAL HIGH (ref 0.44–1.00)
GFR calc Af Amer: 59 mL/min — ABNORMAL LOW (ref >60)
GFR calc non Af Amer: 51 mL/min — ABNORMAL LOW (ref >60)
Glucose, Bld: 118 mg/dL — ABNORMAL HIGH (ref 70–99)
Phosphorus: 3.4 mg/dL (ref 2.5–4.6)
Potassium: 5.4 mmol/L — ABNORMAL HIGH (ref 3.5–5.1)
Sodium: 144 mmol/L (ref 135–145)

## 2018-09-14 LAB — MAGNESIUM: Magnesium: 2.2 mg/dL (ref 1.7–2.4)

## 2018-09-14 MED ORDER — DIGOXIN 125 MCG PO TABS
0.1250 mg | ORAL_TABLET | Freq: Every day | ORAL | Status: DC
  Administered 2018-09-14: 0.125 mg via ORAL
  Filled 2018-09-14: qty 1

## 2018-09-14 MED ORDER — DILTIAZEM HCL ER COATED BEADS 180 MG PO CP24
360.0000 mg | ORAL_CAPSULE | Freq: Every day | ORAL | Status: DC
  Administered 2018-09-15 – 2018-09-18 (×4): 360 mg via ORAL
  Filled 2018-09-14 (×4): qty 2

## 2018-09-14 MED ORDER — DILTIAZEM HCL ER COATED BEADS 180 MG PO CP24: 300.0000 mg | ORAL_CAPSULE | Freq: Every day | ORAL | Status: DC
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
## 2018-09-14 NOTE — NC FL2 (Signed)
[place] LEVEL OF CARE SCREENING TOOL     IDENTIFICATION  Patient Name: Lindsey Goodman Birthdate: 05/24/1941 Sex: female Admission Date (Current Location): 09/09/2018  [facility] and [place] Number:  Herbalist and Address:  [facility] [place] [place] [address] [place], [place] [address]      Provider Number: [id number]  Attending Physician Name and Address:  Nita Sells, MD  Relative Name and Phone Number:  Roux Brandy, spouse, [phone]    Current Level of Care: Hospital Recommended Level of Care: [place] Prior Approval Number:    Date Approved/Denied:   PASRR Number: [id number]  Discharge Plan:      Current Diagnoses: Patient Active Problem List   Diagnosis Date Noted  . A-fib ([place]) 09/09/2018  . Hyperlipidemia 09/09/2018  . Leukocytosis 09/09/2018  . Dementia ([place]) 04/29/2018  . Acute on chronic renal insufficiency 04/29/2018  . Hypokalemia 04/29/2018  . Fall 04/29/2018  . Abnormal LFTs 04/29/2018  . Memory loss 11/02/2017  . Seasonal allergies 05/10/2015  . Sinus bradycardia 09/05/2014  . Fatigue 02/10/2014  . Essential hypertension 08/04/2013  . Renal artery stenosis ([place]) 08/04/2013  . Hypothyroidism 08/04/2013  . Dyslipidemia 08/04/2013  . GERD (gastroesophageal reflux disease) 08/04/2013  . Lipoma of back 01/16/2012  . Irritable bowel syndrome 04/10/2008    Orientation RESPIRATION BLADDER Height & Weight     Self  Normal Incontinent, External catheter Weight: 51.7 kg Height:  5\' 5"  (165.1 cm)  BEHAVIORAL SYMPTOMS/MOOD NEUROLOGICAL BOWEL NUTRITION STATUS      Incontinent Diet(please see DC summary)  AMBULATORY STATUS COMMUNICATION OF NEEDS Skin   Extensive Assist Verbally Normal                       Personal Care Assistance Level of Assistance  Bathing, Feeding, Dressing Bathing Assistance: Maximum assistance Feeding assistance: Limited assistance Dressing Assistance:  Maximum assistance     Functional Limitations Info  Sight, Hearing, Speech Sight Info: Adequate Hearing Info: Adequate Speech Info: Adequate    SPECIAL CARE FACTORS FREQUENCY  PT (By licensed PT)     PT Frequency: 5x/week              Contractures Contractures Info: Not present    Additional Factors Info  Code Status, Allergies, Psychotropic Code Status Info: DNR Allergies Info: Atorvastatin, Amlodipine Besylate, Prednisone Psychotropic Info: aricept, namenda         Current Medications (09/14/2018):  This is the current hospital active medication list Current Facility-Administered Medications  Medication Dose Route Frequency Provider Last Rate Last Dose  . acetaminophen (TYLENOL) tablet 650 mg  650 mg Oral Q4H PRN Nita Sells, MD   650 mg at 09/12/18 2131  . apixaban (ELIQUIS) tablet 5 mg  5 mg Oral BID Nita Sells, MD   5 mg at 09/13/18 2128  . diltiazem (CARDIZEM CD) 24 hr capsule 360 mg  360 mg Oral Daily Nita Sells, MD   360 mg at 09/13/18 1032  . donepezil (ARICEPT) tablet 5 mg  5 mg Oral Daily Nita Sells, MD   5 mg at 09/13/18 1032  . ezetimibe (ZETIA) tablet 10 mg  10 mg Oral Daily Nita Sells, MD   10 mg at 09/13/18 1032  . levothyroxine (SYNTHROID, LEVOTHROID) tablet 75 mcg  75 mcg Oral QAC breakfast Nita Sells, MD   75 mcg at 09/14/18 0544  . magnesium oxide (MAG-OX) tablet 400 mg  400 mg Oral BID Nita Sells, MD  400 mg at 09/13/18 2128  . memantine (NAMENDA) tablet 10 mg  10 mg Oral BID Nita Sells, MD   10 mg at 09/13/18 2128  . metoprolol tartrate (LOPRESSOR) tablet 125 mg  125 mg Oral BID Nita Sells, MD   125 mg at 09/13/18 2128  . ondansetron (ZOFRAN) injection 4 mg  4 mg Intravenous Q6H PRN Nita Sells, MD      . potassium chloride SA (K-DUR,KLOR-CON) CR tablet 40 mEq  40 mEq Oral BID Nita Sells, MD   40 mEq at 09/13/18 2128     Discharge  Medications: Please see discharge summary for a list of discharge medications.  Relevant Imaging Results:  Relevant Lab Results:   Additional Information SSN: [id number]  Estanislado Emms, LCSW
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
## 2018-09-14 NOTE — Telephone Encounter (Signed)
Appt scheduled 12/20 with L. Rosalyn Gess PA

## 2018-09-14 NOTE — Progress Notes (Signed)
Noted recommendation for SNF. Have placed calls to patient's spouse (home and cell) and left voicemails requesting call back. Continue to await return call. No family at bedside and patient only oriented to self.  Patient will need [employer] authorization before admitting to SNF. Auth can be started after CSW speaks to family regarding disposition. CSW to follow.  Estanislado Emms, LCSW [phone]

## 2018-09-15 LAB — RENAL FUNCTION PANEL
Albumin: 3.1 g/dL — ABNORMAL LOW (ref 3.5–5.0)
Anion gap: 8 (ref 5–15)
BUN: 31 mg/dL — ABNORMAL HIGH (ref 8–23)
CO2: 23 mmol/L (ref 22–32)
Calcium: 9.4 mg/dL (ref 8.9–10.3)
Chloride: 111 mmol/L (ref 98–111)
Creatinine, Ser: 1.05 mg/dL — ABNORMAL HIGH (ref 0.44–1.00)
GFR calc Af Amer: 60 mL/min — ABNORMAL LOW (ref >60)
GFR calc non Af Amer: 52 mL/min — ABNORMAL LOW (ref >60)
Glucose, Bld: 124 mg/dL — ABNORMAL HIGH (ref 70–99)
Phosphorus: 3.5 mg/dL (ref 2.5–4.6)
Potassium: 4.3 mmol/L (ref 3.5–5.1)
Sodium: 142 mmol/L (ref 135–145)

## 2018-09-15 LAB — AMMONIA: Ammonia: 27 umol/L (ref 9–35)

## 2018-09-15 LAB — TSH: TSH: 4.426 u[IU]/mL (ref 0.350–4.500)

## 2018-09-15 LAB — MAGNESIUM: Magnesium: 2.3 mg/dL (ref 1.7–2.4)
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
## 2018-09-15 NOTE — Progress Notes (Addendum)
Progress Note  Patient Name: Lindsey Goodman Date of Encounter: 09/15/2018  Primary Cardiologist: Shelva Majestic, MD   Subjective   Still confused but denies CP or dyspnea  Inpatient Medications    Scheduled Meds: . apixaban  5 mg Oral BID  . diltiazem  360 mg Oral Daily  . donepezil  5 mg Oral Daily  . ezetimibe  10 mg Oral Daily  . levothyroxine  75 mcg Oral QAC breakfast  . magnesium oxide  400 mg Oral BID  . memantine  10 mg Oral BID  . metoprolol tartrate  125 mg Oral BID   Continuous Infusions:  PRN Meds: acetaminophen, ondansetron (ZOFRAN) IV   Vital Signs    Vitals:   09/15/18 0416 09/15/18 0449 09/15/18 0459 09/15/18 0540  BP:  (!) 146/111  (!) 145/89  Pulse: (!) 110 73    Resp:  18    Temp:  98.7 F (37.1 C)    TempSrc:  Oral    SpO2:  96%    Weight:   49.4 kg   Height:        Intake/Output Summary (Last 24 hours) at 09/15/2018 0833 Last data filed at 09/15/2018 0459 Gross per 24 hour  Intake 220 ml  Output 300 ml  Net -80 ml   Filed Weights   09/14/18 0342 09/14/18 2004 09/15/18 0459  Weight: 51.7 kg 50.6 kg 49.4 kg    Telemetry    Atrial fibrillation; rate reasonably well controlled (90-100) - Personally Reviewed   Physical Exam   GEN: WD No acute distress. Frail  Neck: No JVD, supple Cardiac: irregular, no murmur Respiratory: CTA GI: Soft, NT/ND MS: No edema Neuro:  Moves all ext; remains confused; thinks she is in [place]  Lab 09/09/18 1351  09/12/18 0429 09/14/18 0828 09/15/18 0552  NA 143   < > 147* 144 142  K 3.2*   < > 4.0 5.4* 4.3  CL 101   < > 112* 110 111  CO2 23   < > 25 22 23   GLUCOSE 139*   < > 118* 118* 124*  BUN 21   < > 23 29* 31*  CREATININE 1.32*   < > 1.02* 1.06* 1.05*  CALCIUM 9.9   < > 9.3 9.8 9.4  PROT 7.2  --   --   --   --   ALBUMIN 3.9   < > 3.2* 3.4* 3.1*  AST 45*  --   --   --   --   ALT 33  --   --   --   --   ALKPHOS 77  --   --   --   --   BILITOT 1.8*  --    --   --   --   GFRNONAA 39*   < > 53* 51* 52*  GFRAA 45*   < > >60 59* 60*  ANIONGAP 19*   < > 10 12 8    < > = values in this interval not displayed.     Hematology Recent Labs  Lab 09/09/18 1351 09/11/18 0416 09/12/18 0429  WBC 14.6* 9.5 7.9  RBC 5.70* 4.63 4.46  HGB 15.5* 12.6 12.4  HCT 50.0* 40.3 39.2  MCV 87.7 87.0 87.9  MCH 27.2 27.2 27.8  MCHC 31.0 31.3 31.6  RDW 13.7 14.0 13.9  PLT 274 200 208    Cardiac Enzymes Recent Labs  Lab 09/09/18 1351 09/10/18 1116 09/10/18  1630 09/10/18 2304  TROPONINI 0.57* 0.56* 0.54* 0.48*     Patient Profile     77 year old female with past medical history of recently diagnosed atrial fibrillation, hypertension, hyperlipidemia, dementia, PAD for evaluation of atrial fibrillation with RVR.    Last echocardiogram November 2019 showed normal LV function, moderate mitral regurgitation, moderate left atrial enlargement and moderate to severe tricuspid regurgitation.  Assessment & Plan    1 atrial fibrillation/flutter-patient remains in atrial fibrillation today.  Her heart rate is reasonable and in the 90-100 range.  Continue present dose of Cardizem and metoprolol.  Continue apixaban.  Note TSH is normal and recent echocardiogram showed preserved LV function.  Our plan will be rate control and anticoagulation.  However given dementia she will need to be followed closely as risk of anticoagulation may outweigh benefit.   2 no CODE BLUE status  3 dementia-has significant dementia.  Per primary care.  4 hypertension-blood pressure is controlled.  Continue present medications.  5 elevated troponin-no clear trend and patient denies chest pain.  Not consistent with acute coronary syndrome.  Also given dementia not a candidate for further ischemia evaluation.   Agree with palliative care consult  [facility] will sign off.   Medication Recommendations: Continue present medications. Other recommendations (labs, testing, etc): No  additional cardiac testing. Follow up as an outpatient: Dr. Claiborne Billings 8 to 12 weeks.  For questions or updates, please contact [place] Please consult [email or web address] for contact info under        Signed, Kirk Ruths, MD  09/15/2018, 8:33 AM
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
## 2018-09-16 ENCOUNTER — Encounter (HOSPITAL_COMMUNITY): Payer: Self-pay | Admitting: Primary Care

## 2018-09-16 DIAGNOSIS — Z7189 Other specified counseling: Secondary | ICD-10-CM

## 2018-09-16 DIAGNOSIS — F0150 Vascular dementia without behavioral disturbance: Secondary | ICD-10-CM

## 2018-09-16 DIAGNOSIS — I4891 Unspecified atrial fibrillation: Secondary | ICD-10-CM

## 2018-09-16 DIAGNOSIS — Z515 Encounter for palliative care: Secondary | ICD-10-CM

## 2018-09-16 NOTE — Progress Notes (Addendum)
CSW spoke to patient's daughter, Karna Christmas, on the phone, and discussed SNF options. [facility] will accept patient if family agrees to pay the bill if insurance does not cover the rehab stay. Patient's daughter indicated family is willing to do that. CSW confirmed the bed with [facility]. CSW to follow and support with discharge planning.  Estanislado Emms, LCSW [phone]
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
## 2018-09-16 NOTE — Clinical Social Work Note (Addendum)
[facility] still waiting on decision from corporate. [facility] have both declined bed offers.  Dayton Scrape, [place] [phone]  2:13 pm [facility] has agreed to accept patient as long as family agrees to pay her bill if insurance ends up not covering it. Left voicemail for daughter. Will update her and get decision when she calls back.  Dayton Scrape, [place]

## 2018-09-16 NOTE — Care Management Important Message (Signed)
Important Message  Patient Details  Name: Lindsey Goodman MRN: [id number] Date of Birth: 09-Mar-1941   Medicare Important Message Given:  Yes    Melonee Gerstel P [place] 09/16/2018, 2:35 PM
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
## 2018-09-17 LAB — BASIC METABOLIC PANEL
Anion gap: 10 (ref 5–15)
BUN: 25 mg/dL — ABNORMAL HIGH (ref 8–23)
CO2: 27 mmol/L (ref 22–32)
Calcium: 8.9 mg/dL (ref 8.9–10.3)
Chloride: 107 mmol/L (ref 98–111)
Creatinine, Ser: 1.14 mg/dL — ABNORMAL HIGH (ref 0.44–1.00)
GFR calc Af Amer: 54 mL/min — ABNORMAL LOW (ref >60)
GFR calc non Af Amer: 47 mL/min — ABNORMAL LOW (ref >60)
Glucose, Bld: 118 mg/dL — ABNORMAL HIGH (ref 70–99)
Potassium: 3.5 mmol/L (ref 3.5–5.1)
Sodium: 144 mmol/L (ref 135–145)

## 2018-09-17 LAB — CBC WITH DIFFERENTIAL/PLATELET
Abs Immature Granulocytes: 0.08 10*3/uL — ABNORMAL HIGH (ref 0.00–0.07)
Basophils Absolute: 0.1 10*3/uL (ref 0.0–0.1)
Basophils Relative: 1 %
Eosinophils Absolute: 0.1 10*3/uL (ref 0.0–0.5)
Eosinophils Relative: 1 %
HCT: 40.4 % (ref 36.0–46.0)
Hemoglobin: 12.7 g/dL (ref 12.0–15.0)
Immature Granulocytes: 1 %
Lymphocytes Relative: 23 %
Lymphs Abs: 1.9 10*3/uL (ref 0.7–4.0)
MCH: 27.8 pg (ref 26.0–34.0)
MCHC: 31.4 g/dL (ref 30.0–36.0)
MCV: 88.4 fL (ref 80.0–100.0)
Monocytes Absolute: 0.8 10*3/uL (ref 0.1–1.0)
Monocytes Relative: 10 %
Neutro Abs: 5.2 10*3/uL (ref 1.7–7.7)
Neutrophils Relative %: 64 %
Platelets: 275 10*3/uL (ref 150–400)
RBC: 4.57 MIL/uL (ref 3.87–5.11)
RDW: 14.7 % (ref 11.5–15.5)
WBC: 8.1 10*3/uL (ref 4.0–10.5)
nRBC: 0 % (ref 0.0–0.2)

## 2018-09-17 LAB — MAGNESIUM: Magnesium: 2.3 mg/dL (ref 1.7–2.4)

## 2018-09-17 LAB — PHOSPHORUS: Phosphorus: 3.1 mg/dL (ref 2.5–4.6)

## 2018-09-17 MED ORDER — MAGNESIUM OXIDE 400 (241.3 MG) MG PO TABS: 400.0000 mg | ORAL_TABLET | Freq: Two times a day (BID) | ORAL | 0 refills | Status: AC

## 2018-09-17 MED ORDER — DILTIAZEM HCL ER COATED BEADS 360 MG PO CP24: 360.0000 mg | ORAL_CAPSULE | Freq: Every day | ORAL | 0 refills | Status: DC

## 2018-09-17 MED ORDER — METOPROLOL TARTRATE 25 MG PO TABS: 125.0000 mg | ORAL_TABLET | Freq: Two times a day (BID) | ORAL | 0 refills | Status: AC

## 2018-09-17 NOTE — Consult Note (Signed)
   [facility] CM Inpatient Consult   09/17/2018  Lindsey Goodman 08-04-1941 [id number]   Patient screened for potential [facility] Care Management services due to unplanned readmission risk score of 20% and multiple hospitalizations.   Reviewed patient chart. Recommendation is for transition to SNF.  No [facility] CM needs at this time.  Netta Cedars, MSN, [facility] Liaison Nurse Mobile Phone [phone]  Toll free office [phone]

## 2018-09-17 NOTE — Progress Notes (Signed)
Patient is discharge to [facility]. [facility] to pick patient up. IV peripheral removed, clean dry and intact, pressure and dressing applied . Patient is currently dressed and waiting for [facility].
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
## 2018-09-17 NOTE — Progress Notes (Signed)
Called [facility] at [phone] to give report to [employer].

## 2018-09-17 NOTE — Clinical Social Work Placement (Signed)
Nurse to call and report to [phone].   CLINICAL SOCIAL WORK PLACEMENT  NOTE  Date:  09/17/2018  Patient Details  Name: Lindsey Goodman MRN: [id number] Date of Birth: 1940/11/01  Clinical Social Work is seeking post-discharge placement for this patient at the [place] level of care (*CSW will initial, date and re-position this form in  chart as items are completed):  Yes   Patient/family provided with [facility] Work Department's list of facilities offering this level of care within the geographic area requested by the patient (or if unable, by the patient's family).  Yes   Patient/family informed of their freedom to choose among providers that offer the needed level of care, that participate in Medicare, Medicaid or managed care program needed by the patient, have an available bed and are willing to accept the patient.  Yes   Patient/family informed of [facility]'s ownership interest in [facility] and [facility], as well as of the fact that they are under no obligation to receive care at these facilities.  PASRR submitted to EDS on       PASRR number received on 09/14/18     Existing PASRR number confirmed on       FL2 transmitted to all facilities in geographic area requested by pt/family on 09/14/18     FL2 transmitted to all facilities within larger geographic area on       Patient informed that his/her managed care company has contracts with or will negotiate with certain facilities, including the following:        Yes   Patient/family informed of bed offers received.  Patient chooses bed at [facility]     Physician recommends and patient chooses bed at      Patient to be transferred to [facility] on 09/17/18.  Patient to be transferred to facility by [facility]     Patient family notified on 09/17/18 of transfer.  Name of family member notified:        PHYSICIAN       Additional Comment:     _______________________________________________ Gelene Mink, [place] 09/17/2018, 12:46 PM
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
## 2018-09-17 NOTE — Progress Notes (Signed)
RN called [facility] to check on pickup. Per dispatcher, patient is not on the list for pickup tonight and there is no one available to pick up patient for at least an hour. Per RN at [facility], facility does not accept admissions past 9pm. There were willing to wait till 10pm if patient would be there by then. Dispatcher could not guarantee this. Charles Bodenheimer,NP notified. Order to keep patient tonight and D/C tomorrow. Patient made aware. Message left for daughter on voicemail. Will continue to monitor patient.

## 2018-09-18 DIAGNOSIS — K5900 Constipation, unspecified: Secondary | ICD-10-CM | POA: Diagnosis not present

## 2018-09-18 DIAGNOSIS — R41841 Cognitive communication deficit: Secondary | ICD-10-CM | POA: Diagnosis not present

## 2018-09-18 DIAGNOSIS — E039 Hypothyroidism, unspecified: Secondary | ICD-10-CM | POA: Diagnosis not present

## 2018-09-18 DIAGNOSIS — I214 Non-ST elevation (NSTEMI) myocardial infarction: Secondary | ICD-10-CM | POA: Diagnosis not present

## 2018-09-18 DIAGNOSIS — N189 Chronic kidney disease, unspecified: Secondary | ICD-10-CM | POA: Diagnosis not present

## 2018-09-18 DIAGNOSIS — I499 Cardiac arrhythmia, unspecified: Secondary | ICD-10-CM | POA: Diagnosis not present

## 2018-09-18 DIAGNOSIS — M2550 Pain in unspecified joint: Secondary | ICD-10-CM | POA: Diagnosis not present

## 2018-09-18 DIAGNOSIS — I4891 Unspecified atrial fibrillation: Secondary | ICD-10-CM | POA: Diagnosis not present

## 2018-09-18 DIAGNOSIS — I10 Essential (primary) hypertension: Secondary | ICD-10-CM | POA: Diagnosis not present

## 2018-09-18 DIAGNOSIS — Y32XXXA Crashing of motor vehicle, undetermined intent, initial encounter: Secondary | ICD-10-CM | POA: Diagnosis not present

## 2018-09-18 DIAGNOSIS — D72829 Elevated white blood cell count, unspecified: Secondary | ICD-10-CM | POA: Diagnosis not present

## 2018-09-18 DIAGNOSIS — G9341 Metabolic encephalopathy: Secondary | ICD-10-CM | POA: Diagnosis not present

## 2018-09-18 DIAGNOSIS — Z9181 History of falling: Secondary | ICD-10-CM | POA: Diagnosis not present

## 2018-09-18 DIAGNOSIS — E785 Hyperlipidemia, unspecified: Secondary | ICD-10-CM | POA: Diagnosis not present

## 2018-09-18 DIAGNOSIS — F0390 Unspecified dementia without behavioral disturbance: Secondary | ICD-10-CM | POA: Diagnosis not present

## 2018-09-18 DIAGNOSIS — R2689 Other abnormalities of gait and mobility: Secondary | ICD-10-CM | POA: Diagnosis not present

## 2018-09-18 DIAGNOSIS — Z7401 Bed confinement status: Secondary | ICD-10-CM | POA: Diagnosis not present

## 2018-09-18 DIAGNOSIS — Z515 Encounter for palliative care: Secondary | ICD-10-CM | POA: Diagnosis not present

## 2018-09-18 DIAGNOSIS — I739 Peripheral vascular disease, unspecified: Secondary | ICD-10-CM | POA: Diagnosis not present

## 2018-09-18 DIAGNOSIS — Z111 Encounter for screening for respiratory tuberculosis: Secondary | ICD-10-CM | POA: Diagnosis not present

## 2018-09-18 DIAGNOSIS — M6281 Muscle weakness (generalized): Secondary | ICD-10-CM | POA: Diagnosis not present

## 2018-09-18 DIAGNOSIS — R404 Transient alteration of awareness: Secondary | ICD-10-CM | POA: Diagnosis not present

## 2018-09-18 DIAGNOSIS — R52 Pain, unspecified: Secondary | ICD-10-CM | POA: Diagnosis not present

## 2018-09-18 DIAGNOSIS — K589 Irritable bowel syndrome without diarrhea: Secondary | ICD-10-CM | POA: Diagnosis not present

## 2018-09-18 NOTE — Progress Notes (Signed)
Report given to the nurse Lyssa RN supervisor at [facility]. Patient is schedule for pick up at 1130 am. I went over the discharge instructions with the nurse and all questions and concerns were answered and addressed.

## 2018-09-18 NOTE — Progress Notes (Signed)
Patient will Discharge To: [facility] Anticipated DC Date:09/18/18 Family Notified: Left voice message on daughter's cellphone [phone], Marti Mclane. Transport By: Corey Harold   Per MD patient ready for DC to [facility] . RN, patient, patient's family, and facility notified of DC. Assessment, Fl2/Pasrr, and Discharge Summary sent to facility. RN given number for report ([phone], Room # 609B). DC packet on chart. Ambulance transport requested for patient.   CSW signing off.  Reed Breech LCSWA [phone]
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
## 2018-09-20 ENCOUNTER — Ambulatory Visit: Payer: PPO | Admitting: Diagnostic Neuroimaging

## 2018-09-21 DIAGNOSIS — I214 Non-ST elevation (NSTEMI) myocardial infarction: Secondary | ICD-10-CM | POA: Diagnosis not present

## 2018-09-21 DIAGNOSIS — G9341 Metabolic encephalopathy: Secondary | ICD-10-CM | POA: Diagnosis not present

## 2018-09-21 DIAGNOSIS — Y32XXXA Crashing of motor vehicle, undetermined intent, initial encounter: Secondary | ICD-10-CM | POA: Diagnosis not present

## 2018-09-21 DIAGNOSIS — I10 Essential (primary) hypertension: Secondary | ICD-10-CM | POA: Diagnosis not present

## 2018-09-21 DIAGNOSIS — M6281 Muscle weakness (generalized): Secondary | ICD-10-CM | POA: Diagnosis not present

## 2018-09-21 DIAGNOSIS — E039 Hypothyroidism, unspecified: Secondary | ICD-10-CM | POA: Diagnosis not present

## 2018-09-21 DIAGNOSIS — N189 Chronic kidney disease, unspecified: Secondary | ICD-10-CM | POA: Diagnosis not present

## 2018-09-24 ENCOUNTER — Ambulatory Visit: Payer: PPO | Admitting: Cardiology

## 2018-09-28 DIAGNOSIS — M6281 Muscle weakness (generalized): Secondary | ICD-10-CM | POA: Diagnosis not present

## 2018-09-28 DIAGNOSIS — I10 Essential (primary) hypertension: Secondary | ICD-10-CM | POA: Diagnosis not present

## 2018-09-28 DIAGNOSIS — F0390 Unspecified dementia without behavioral disturbance: Secondary | ICD-10-CM | POA: Diagnosis not present

## 2018-09-28 DIAGNOSIS — E039 Hypothyroidism, unspecified: Secondary | ICD-10-CM | POA: Diagnosis not present

## 2018-09-28 DIAGNOSIS — I214 Non-ST elevation (NSTEMI) myocardial infarction: Secondary | ICD-10-CM | POA: Diagnosis not present

## 2018-09-28 DIAGNOSIS — Y32XXXA Crashing of motor vehicle, undetermined intent, initial encounter: Secondary | ICD-10-CM | POA: Diagnosis not present

## 2018-09-28 DIAGNOSIS — G9341 Metabolic encephalopathy: Secondary | ICD-10-CM | POA: Diagnosis not present

## 2018-09-28 DIAGNOSIS — N189 Chronic kidney disease, unspecified: Secondary | ICD-10-CM | POA: Diagnosis not present

## 2018-10-07 DIAGNOSIS — D72829 Elevated white blood cell count, unspecified: Secondary | ICD-10-CM | POA: Diagnosis not present

## 2018-10-07 DIAGNOSIS — R2689 Other abnormalities of gait and mobility: Secondary | ICD-10-CM | POA: Diagnosis not present

## 2018-10-07 DIAGNOSIS — I4891 Unspecified atrial fibrillation: Secondary | ICD-10-CM | POA: Diagnosis not present

## 2018-10-07 DIAGNOSIS — M6281 Muscle weakness (generalized): Secondary | ICD-10-CM | POA: Diagnosis not present

## 2018-10-08 DIAGNOSIS — I214 Non-ST elevation (NSTEMI) myocardial infarction: Secondary | ICD-10-CM | POA: Diagnosis not present

## 2018-10-08 DIAGNOSIS — Y32XXXA Crashing of motor vehicle, undetermined intent, initial encounter: Secondary | ICD-10-CM | POA: Diagnosis not present

## 2018-10-08 DIAGNOSIS — F0390 Unspecified dementia without behavioral disturbance: Secondary | ICD-10-CM | POA: Diagnosis not present

## 2018-10-08 DIAGNOSIS — G9341 Metabolic encephalopathy: Secondary | ICD-10-CM | POA: Diagnosis not present

## 2018-10-08 DIAGNOSIS — E039 Hypothyroidism, unspecified: Secondary | ICD-10-CM | POA: Diagnosis not present

## 2018-10-08 DIAGNOSIS — I10 Essential (primary) hypertension: Secondary | ICD-10-CM | POA: Diagnosis not present

## 2018-10-08 DIAGNOSIS — R2681 Unsteadiness on feet: Secondary | ICD-10-CM | POA: Diagnosis not present

## 2018-10-08 DIAGNOSIS — M6281 Muscle weakness (generalized): Secondary | ICD-10-CM | POA: Diagnosis not present

## 2018-10-08 DIAGNOSIS — N189 Chronic kidney disease, unspecified: Secondary | ICD-10-CM | POA: Diagnosis not present

## 2018-10-12 ENCOUNTER — Emergency Department (HOSPITAL_COMMUNITY)
Admission: EM | Admit: 2018-10-12 | Discharge: 2018-10-12 | Disposition: A | Discharge status: Home / Self Care | Payer: PPO | Attending: Emergency Medicine | Admitting: Emergency Medicine

## 2018-10-12 ENCOUNTER — Other Ambulatory Visit: Payer: Self-pay | Admitting: *Deleted

## 2018-10-12 DIAGNOSIS — E039 Hypothyroidism, unspecified: Secondary | ICD-10-CM | POA: Diagnosis not present

## 2018-10-12 DIAGNOSIS — Z008 Encounter for other general examination: Secondary | ICD-10-CM | POA: Diagnosis present

## 2018-10-12 DIAGNOSIS — R45851 Suicidal ideations: Secondary | ICD-10-CM

## 2018-10-12 DIAGNOSIS — R0902 Hypoxemia: Secondary | ICD-10-CM | POA: Diagnosis not present

## 2018-10-12 DIAGNOSIS — Z7901 Long term (current) use of anticoagulants: Secondary | ICD-10-CM | POA: Insufficient documentation

## 2018-10-12 DIAGNOSIS — Z79899 Other long term (current) drug therapy: Secondary | ICD-10-CM | POA: Insufficient documentation

## 2018-10-12 DIAGNOSIS — F0390 Unspecified dementia without behavioral disturbance: Secondary | ICD-10-CM | POA: Diagnosis not present

## 2018-10-12 DIAGNOSIS — I10 Essential (primary) hypertension: Secondary | ICD-10-CM | POA: Insufficient documentation

## 2018-10-12 NOTE — ED Provider Notes (Signed)
Lindsey Goodman DEPT Provider Note   CSN: [id number] Arrival date & time: 10/12/18  1836     History   Chief Complaint Chief Complaint  Patient presents with  . Suicidal    HPI Lindsey Goodman is a 78 y.o. female.  The history is provided by the patient and medical records. No language interpreter was used.  Mental Health Problem  Presenting symptoms: suicidal thoughts and suicidal threats   Presenting symptoms: no aggressive behavior, no agitation, no depression, no hallucinations, no homicidal ideas and no suicide attempt   Degree of incapacity (severity):  Moderate Onset quality:  Gradual Duration:  1 day Timing:  Constant Progression:  Resolved Chronicity:  New Context: not alcohol use, not drug abuse, not noncompliant, not recent medication change and not stressful life event   Treatment compliance:  Untreated Relieved by:  Nothing Worsened by:  Nothing Ineffective treatments:  None tried Associated symptoms: no abdominal pain, no chest pain and no fatigue     Past Medical History:  Diagnosis Date  . GERD (gastroesophageal reflux disease)   . Hx of Doppler ultrasound 12/09/2010   Renal duplexsuggested distal abdominal aorta segment in the origin of the right and left commom iliac arteries with elevated velocities consistent with at least 50% diameter reduction, 60% narrowing in the right renal artery with stable PSV at 210.  Marland Kitchen Hx of echocardiogram 12/09/2010   EF 55% showed mild concrentric LVH with normal systolic function and grade 1 diastolic dysfunction. she had mild to moderate LA dilation, mild to moderate mitral annular calcification with moderate MR, and she had trival tricupid regurgitation.  . Hyperlipidemia   . Hypertension   . Hypothyroidism   . PAF (paroxysmal atrial fibrillation) ([place]) 07/2018    Patient Active Problem List   Diagnosis Date Noted  . Goals of care, counseling/discussion   . Palliative care by specialist     . Atrial fibrillation with rapid ventricular response ([place]) 09/09/2018  . Hyperlipidemia 09/09/2018  . Leukocytosis 09/09/2018  . Dementia ([place]) 04/29/2018  . Acute on chronic renal insufficiency 04/29/2018  . Hypokalemia 04/29/2018  . Fall 04/29/2018  . Abnormal LFTs 04/29/2018  . Memory loss 11/02/2017  . Seasonal allergies 05/10/2015  . Sinus bradycardia 09/05/2014  . Fatigue 02/10/2014  . Essential hypertension 08/04/2013  . Renal artery stenosis ([place]) 08/04/2013  . Hypothyroidism 08/04/2013  . Dyslipidemia 08/04/2013  . GERD (gastroesophageal reflux disease) 08/04/2013  . Lipoma of back 01/16/2012  . Irritable bowel syndrome 04/10/2008    Past Surgical History:  Procedure Laterality Date  . no prior surgery    . WISDOM TOOTH EXTRACTION  1980     OB History   No obstetric history on file.      Home Medications    Prior to Admission medications   Medication Sig Start Date End Date Taking? Authorizing Provider  apixaban (ELIQUIS) 5 MG TABS tablet Take 1 tablet (5 mg total) by mouth 2 (two) times daily. 07/27/18  Yes Troy Sine, MD  diltiazem (CARDIZEM CD) 360 MG 24 hr capsule Take 1 capsule (360 mg total) by mouth daily. 09/17/18  Yes Hall, Carole N, DO  donepezil (ARICEPT) 5 MG tablet TAKE 1 TABLET BY MOUTH EVERY DAY Patient taking differently: Take 5 mg by mouth at bedtime.  04/20/18  Yes Dennie Bible, NP  ezetimibe (ZETIA) 10 MG tablet TAKE 1 TABLET BY MOUTH EVERY DAY Patient taking differently: Take 10 mg by mouth daily.  08/02/18  Yes Claiborne Billings,  Joyice Faster, MD  levothyroxine (SYNTHROID, LEVOTHROID) 75 MCG tablet TAKE 1 TABLET (75 MCG TOTAL) BY MOUTH DAILY BEFORE BREAKFAST. 06/28/18  Yes Troy Sine, MD  magnesium oxide (MAG-OX) 400 (241.3 Mg) MG tablet Take 1 tablet (400 mg total) by mouth 2 (two) times daily. 09/17/18  Yes Hall, Carole N, DO  memantine (NAMENDA) 10 MG tablet TAKE 1 TABLET BY MOUTH TWICE A DAY Patient taking differently: Take 10 mg by  mouth 2 (two) times daily.  02/15/18  Yes Penumalli, Earlean Polka, MD  metoprolol tartrate (LOPRESSOR) 25 MG tablet Take 5 tablets (125 mg total) by mouth 2 (two) times daily. Patient taking differently: Take 25 mg by mouth 2 (two) times daily.  09/17/18  Yes Kayleen Memos, DO    Family History Family History  Problem Relation Age of Onset  . Hypertension Mother   . Stroke Mother   . Hypertension Father   . Stroke Father   . Colon cancer Neg Hx   . Stomach cancer Neg Hx     Social History Social History   Tobacco Use  . Smoking status: Never Smoker  . Smokeless tobacco: Never Used  Substance Use Topics  . Alcohol use: No  . Drug use: No     Allergies   Atorvastatin; Amlodipine besylate; and Prednisone   Review of Systems Review of Systems  Constitutional: Negative for chills, diaphoresis, fatigue and fever.  HENT: Negative for congestion, ear pain and sore throat.   Eyes: Negative for pain and visual disturbance.  Respiratory: Negative for cough, chest tightness, shortness of breath and wheezing.   Cardiovascular: Negative for chest pain, palpitations and leg swelling.  Gastrointestinal: Negative for abdominal pain, constipation, diarrhea, nausea and vomiting.  Genitourinary: Negative for dysuria, flank pain and hematuria.  Musculoskeletal: Negative for arthralgias, back pain, neck pain and neck stiffness.  Skin: Negative for color change and rash.  Neurological: Negative for seizures and syncope.  Psychiatric/Behavioral: Positive for suicidal ideas. Negative for agitation, confusion, hallucinations and homicidal ideas.  All other systems reviewed and are negative.    Physical Exam Updated Vital Signs BP (!) 143/76 (BP Location: Right Arm)   Temp 98.8 F (37.1 C) (Oral)   Resp 16   SpO2 99%   Physical Exam Vitals signs and nursing note reviewed.  Constitutional:      General: She is not in acute distress.    Appearance: She is well-developed. She is not  ill-appearing, toxic-appearing or diaphoretic.  HENT:     Head: Normocephalic and atraumatic.     Nose: No congestion or rhinorrhea.     Mouth/Throat:     Pharynx: No oropharyngeal exudate or posterior oropharyngeal erythema.  Eyes:     Extraocular Movements: Extraocular movements intact.     Conjunctiva/sclera: Conjunctivae normal.     Pupils: Pupils are equal, round, and reactive to light.  Neck:     Musculoskeletal: Normal range of motion and neck supple.  Cardiovascular:     Rate and Rhythm: Normal rate and regular rhythm.     Pulses: Normal pulses.     Heart sounds: No murmur.  Pulmonary:     Effort: Pulmonary effort is normal. No respiratory distress.     Breath sounds: Normal breath sounds. No stridor. No wheezing, rhonchi or rales.  Chest:     Chest wall: No tenderness.  Abdominal:     General: There is no distension.     Palpations: Abdomen is soft.     Tenderness: There is  no abdominal tenderness.  Skin:    General: Skin is warm and dry.     Findings: No bruising, erythema or rash.  Neurological:     General: No focal deficit present.     Mental Status: She is alert.     Cranial Nerves: No cranial nerve deficit.     Sensory: No sensory deficit.     Motor: No weakness.  Psychiatric:        Mood and Affect: Mood normal.        Behavior: Behavior is not agitated or aggressive.        Thought Content: Thought content does not include homicidal or suicidal ideation. Thought content does not include homicidal or suicidal plan.      ED Treatments / Results  Labs (all labs ordered are listed, but only abnormal results are displayed) Labs Reviewed - No data to display  EKG None  Radiology No results found.  Procedures Procedures (including critical care time)  Medications Ordered in ED Medications - No data to display   Initial Impression / Assessment and Plan / ED Course  I have reviewed the triage vital signs and the nursing notes.  Pertinent labs &  imaging results that were available during my care of the patient were reviewed by me and considered in my medical decision making (see chart for details).     Lindsey Goodman is a 78 y.o. female with a past medical history significant for dementia, hypothyroidism, hypertension, hyperlipidemia, GERD, and paroxysmal atrial fibrillation on Eliquis therapy who presents with concern for suicidal threat and suicidal ideation.  According to EMS report to nursing, patient told someone at her facility that she was going to kill herself and threw herself into the lake to drown.  Patient reports that she has never had these thoughts before.  She is not sure why she said this.  She denies depression, suicidal ideation at this time.  She is never tried herself before.  She denies homicidal ideation, audiovisual hallucinations, or any physical complaints at this time.  She is denies any fevers, chills, chest pain, shortness breath, nausea vomiting, constipation, diarrhea, dysuria, or other complaints.  On exam, patient has no focal neurologic deficits initially seen.  Lungs were clear and chest was nontender.  Abdomen was nontender.  No evidence of trauma or lacerations were seen.  Patient resting comfortably on room air.  Given her lack of any reported symptoms, her lack of suicidal ideation at this time, have low suspicion that patient is danger to herself at this time.  TTS consult will be placed for evaluation of patient who made a suicidal threat saying that she was going to drown herself and currently denies any symptoms.  9:54 PM TTS outpatient did not feel she is a threat to herself.  They did not think that she was suicidal and felt she was stable and to discharge home.  They felt she was psychiatrically clear.  On my reassessment, patient still denies having suicidal thoughts currently or that she wants to jump in the lake and drown.  Patient will be discharged back to her facility for outpatient  follow-up.   Final Clinical Impressions(s) / ED Diagnoses   Final diagnoses:  Suicidal ideation    ED Discharge Orders    None       Clinical Impression: 1. Suicidal ideation     Disposition: Discharge  Condition: Good  I have discussed the results, Dx and Tx plan with the pt(& family if  present). He/she/they expressed understanding and agree(s) with the plan. Discharge instructions discussed at great length. Strict return precautions discussed and pt &/or family have verbalized understanding of the instructions. No further questions at time of discharge.    New Prescriptions   No medications on file    Follow Up: [facility] [address] [place] [address] [phone] Schedule an appointment as soon as possible for a visit    [facility] [place] [id number] [address] [place]        Fredia Chittenden, Gwenyth Allegra, MD 10/12/18 2215

## 2018-10-12 NOTE — BH Assessment (Addendum)
Assessment Note  Lindsey Goodman is a 78 y.o. female who was brought to [facility] ED due to staff at her [place] overhearing her state she wanted to go into the lake and not come back. When questioned by pt, she denies stating this and said she's "had a long day." Pt denied SI, HI, AVH, or NSSIB. She denied any history of SI or depression.  Pt's Skilled Nursing Home shared pt's baseline is person and place. Assessed pt as best possible with this knowledge. Pt was able to share information about her husband, her two children, and where she is from. 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 When pt was asked questions she did not know the answer to, such as when she moved to [facility] or what she doesn't like about living at the [facility], she would pause for a moment and then reply she wasn't sure, as it's been such a long day.  Pt is oriented x3--she was unaware of today's date, though she knew her name/DOB, the [place] she was currently in, and that she was currently in a hospital. Pt's remote memory is slightly impaired; her recent memory is impaired. Pt was cooperative throughout the assessment process. It was not possible to gauge pt's insight, judgement, or impulse control.   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 GERD (gastroesophageal reflux disease)   . Hx of Doppler ultrasound 12/09/2010   Renal duplexsuggested distal abdominal aorta segment in the origin of the right and left commom iliac arteries with elevated velocities consistent with at least 50% diameter reduction, 60% narrowing in the right renal artery with stable PSV at 210.  Marland Kitchen Hx of echocardiogram 12/09/2010   EF 55% showed mild concrentric LVH with normal systolic function and grade 1 diastolic dysfunction. she had mild to moderate LA dilation, mild to moderate mitral annular  calcification with moderate MR, and she had trival tricupid regurgitation.  . Hyperlipidemia   . Hypertension   . Hypothyroidism   . PAF (paroxysmal atrial fibrillation) ([place]) 07/2018    Past Surgical History:  Procedure Laterality Date  . no prior surgery    . WISDOM TOOTH EXTRACTION  1980    Family History:  Family History  Problem Relation Age of Onset  . Hypertension Mother   . Stroke Mother   . Hypertension Father   . Stroke Father   . Colon cancer Neg Hx   . Stomach cancer Neg Hx     Social History:  reports that she has never smoked. She has never used smokeless tobacco. She reports that she does not drink alcohol or use drugs.  Additional Social History:  Alcohol / Drug Use Pain Medications: Please see MAR Prescriptions: Please see MAR Over the Counter: Please see MAR History of alcohol / drug use?: No history of alcohol / drug abuse Longest period of sobriety (when/how long): N/A  CIWA: CIWA-Ar BP: (!) 143/76 COWS:    Allergies:  Allergies  Allergen Reactions  . Atorvastatin Other (See Comments)    Pt states she felt confused.  . Amlodipine Besylate Swelling    Takes at home  . Prednisone     No appetite    Home Medications: (Not in a hospital admission)   OB/GYN Status:  No LMP recorded. Patient is postmenopausal.  General Assessment Data [facility] of Assessment: [facility] TTS Assessment: In system Is  this a Tele or Face-to-Face Assessment?: Face-to-Face Is this an Initial Assessment or a Re-assessment for this encounter?: Initial Assessment Patient Accompanied by:: N/A Language Other than English: No Living Arrangements: In Assisted Living/Nursing Home (Comment: Name of [place] What gender do you identify as?: Female Marital status: Married [place] name: UTA Pregnancy Status: No Living Arrangements: Other (Comment)(Pt lives in an assisted living facility) Can pt return to current living arrangement?: Yes Admission Status: Voluntary Is  patient capable of signing voluntary admission?: Yes Referral Source: Other(Assisted Living facility) Insurance type: [employer]     Crisis Care Plan Living Arrangements: Other (Comment)(Pt lives in an assisted living facility) Legal Guardian: (N/A) Name of Psychiatrist: [place] Name of Therapist: UTA  Education Status Is patient currently in school?: No Is the patient employed, unemployed or receiving disability?: Receiving disability income  Risk to self with the past 6 months Suicidal Ideation: (ALF staff said they heard pt express SI w/ plan; pt denies) Has patient been a risk to self within the past 6 months prior to admission? : (UTA) Suicidal Intent: (ALF staff said they heard pt express SI w/ plan; pt denies) Has patient had any suicidal intent within the past 6 months prior to admission? : (UTA) Is patient at risk for suicide?: (ALF staff said they heard pt express SI w/ plan; pt denies) Suicidal Plan?: (ALF staff said they heard pt express SI w/ plan; pt denies) Has patient had any suicidal plan within the past 6 months prior to admission? : (UTA) Access to Means: (UTA) What has been your use of drugs/alcohol within the last 12 months?: Pt denies Previous Attempts/Gestures: (UTA) How many times?: (UTA) Other Self Harm Risks: UTA Triggers for Past Attempts: Unknown Intentional Self Injurious Behavior: (UTA) Family Suicide History: Unable to assess Recent stressful life event(s): (UTA) Persecutory voices/beliefs?: Pincus Badder) Depression: (UTA) Depression Symptoms: Despondent Substance abuse history and/or treatment for substance abuse?: (UTA)  Risk to Others within the past 6 months Homicidal Ideation: No Does patient have any lifetime risk of violence toward others beyond the six months prior to admission? : No Thoughts of Harm to Others: No Current Homicidal Intent: No Current Homicidal Plan: No Access to Homicidal Means: No Identified Victim: None  noted History of harm to others?: No Assessment of Violence: On admission Violent Behavior Description: None noted Does patient have access to weapons?: (UTA; pt lives in an [place], so doubtful) Criminal Charges Pending?: No Does patient have a court date: No Is patient on probation?: No  Psychosis Hallucinations: (UTA) Delusions: (UTA)  Mental Status Report Appearance/Hygiene: Unremarkable Eye Contact: Fair Motor Activity: Unremarkable Speech: Other (Comment), Slow(Pt was having difficulties answering questions) Level of Consciousness: Quiet/awake Mood: Apathetic Affect: Apathetic Anxiety Level: Minimal Thought Processes: Circumstantial Judgement: Partial Orientation: Person, Place Obsessive Compulsive Thoughts/Behaviors: None  Cognitive Functioning Memory: Recent Impaired, Remote Impaired Is patient IDD: No Insight: Unable to Assess Impulse Control: Unable to Assess Appetite: (UTA) Have you had any weight changes? : (UTA) Sleep: Unable to Assess Total Hours of Sleep: (UTA) Vegetative Symptoms: Unable to Assess  ADLScreening [facility] Assessment Services) Patient's cognitive ability adequate to safely complete daily activities?: (UTA) Patient able to express need for assistance with ADLs?: (UTA) Independently performs ADLs?: (UTA)  Prior Inpatient Therapy Prior Inpatient Therapy: (Pt states no, but unable to verify this information)  Prior Outpatient Therapy Prior Outpatient Therapy: (Pt states no, but unable to verify this information)  ADL Screening (condition at time of admission) Patient's cognitive ability adequate  to safely complete daily activities?: (UTA) Is the patient deaf or have difficulty hearing?: (UTA) Does the patient have difficulty seeing, even when wearing glasses/contacts?: (UTA) Does the patient have difficulty concentrating, remembering, or making decisions?: (UTA) Patient able to express need for assistance with ADLs?:  (UTA) Does the patient have difficulty dressing or bathing?: (UTA) Independently performs ADLs?: (UTA) Does the patient have difficulty walking or climbing stairs?: (UTA) Weakness of Legs: (UTA) Weakness of Arms/Hands: (UTA)     Therapy Consults (therapy consults require a physician order) PT Evaluation Needed: (UTA) OT Evalulation Needed: (UTA) SLP Evaluation Needed: (UTA) Abuse/Neglect Assessment (Assessment to be complete while patient is alone) Abuse/Neglect Assessment Can Be Completed: Unable to assess, patient is non-responsive or altered mental status Values / Beliefs Cultural Requests During Hospitalization: None Spiritual Requests During Hospitalization: None Consults Spiritual Care Consult Needed: No Social Work Consult Needed: No Regulatory affairs officer (For Healthcare) Does Patient Have a Medical Advance Directive?: No Would patient like information on creating a medical advance directive?: No - Patient declined       Disposition: Patriciaann Clan PA reviewed pt's chart and information and determined pt does not meet criteria for inpatient hospitalization. This information was provided to pt's nurse, Mickle Mallory, at 2100 and to her EDP, Dr. Sherry Ruffing, at 2105.  Disposition Initial Assessment Completed for this Encounter: Yes Patient referred to: Other (Comment)(Pt is psych cleared)  On Site Evaluation by:   Reviewed with Physician:    Dannielle Burn 10/12/2018 8:50 PM

## 2018-10-12 NOTE — ED Notes (Signed)
Bed: WA27 Expected date:  Expected time:  Means of arrival:  Comments: EMS-SI

## 2018-10-12 NOTE — Discharge Instructions (Signed)
The psychiatry team felt that you are safe to go back to your facility and were not suicidal at this time.  If you start having any of these thoughts or feelings, please tell someone and come to the nearest emergency department further evaluation.  Please stay hydrated and rest.

## 2018-10-12 NOTE — Patient Outreach (Signed)
[facility]) Care Management  10/12/2018  Lindsey Goodman 12-Jul-1941 [id number]    Facility site visit to [facility] to discuss patient's progress and plan to transition to home.  Met with Earnest Bailey, discharge planner for the facility.  Earnest Bailey stated patient is currently appealing the insurance company's decision for her to be discharged.  Earnest Bailey stated patient's discharge plan is to go to [facility] facility where they provide nursing services, transportation and meals depending on the care package the family chooses.   Attempted to see patient in her room but patient was at an activity.   Will attempt to see patient at next facility visit.   Rutherford Limerick RN, BSN [place] Acute Care Coordinator [phone]) Business Mobile [phone]) Toll free office

## 2018-10-12 NOTE — ED Triage Notes (Signed)
Patient from [name] arrived to ED by EMS after stating that she wanted to jump [place] the [place] and not come back.  Patient has dementia and is alerted to person and place which is her norm.  Patient denied suicidal ideations to staff but they wanted her checked out.

## 2018-10-12 NOTE — ED Notes (Signed)
Being admitted from a nursing home for being overheard to say she should just jump in the lake. Staff at facility took it to mean she was suicidal though she denies this. She has a flat affect, offers little with good eye contact. She denies any previous psych history.  She states her support person is God and when asked if she had someone here on earth as a support person she offered her husband thought they do not live together.

## 2018-10-12 NOTE — ED Notes (Signed)
TTS and Dr eval her and recommend she be discharged and return to her home at [facility]. Report called to Amber at the facility and she requested discharge summary from Dr and that was faxed to her. [facility] called and arranged transport home via ambulance. Reviewed with her her discharge instructions and all property returned to her. She verbalized her understanding but also said her son was here with her if they could swing by and pick him up. She reported to Probation officer Alzheimer was going around and she had some of it. [facility] here and transported her home.

## 2018-10-18 ENCOUNTER — Ambulatory Visit: Payer: PPO | Admitting: Cardiology

## 2018-12-06 ENCOUNTER — Ambulatory Visit: Payer: PPO | Admitting: Cardiology

## 2018-12-09 ENCOUNTER — Ambulatory Visit: Payer: PPO | Admitting: Cardiology

## 2018-12-16 ENCOUNTER — Ambulatory Visit (INDEPENDENT_AMBULATORY_CARE_PROVIDER_SITE_OTHER): Payer: PPO | Admitting: Cardiology

## 2018-12-16 ENCOUNTER — Other Ambulatory Visit: Payer: Self-pay

## 2018-12-16 ENCOUNTER — Encounter: Payer: Self-pay | Admitting: Cardiology

## 2018-12-16 VITALS — BP 117/74 | HR 63 | Temp 98.2°F | Ht 65.0 in | Wt 112.0 lb

## 2018-12-16 DIAGNOSIS — F0150 Vascular dementia without behavioral disturbance: Secondary | ICD-10-CM

## 2018-12-16 DIAGNOSIS — E876 Hypokalemia: Secondary | ICD-10-CM | POA: Diagnosis not present

## 2018-12-16 DIAGNOSIS — I10 Essential (primary) hypertension: Secondary | ICD-10-CM

## 2018-12-16 DIAGNOSIS — I482 Chronic atrial fibrillation, unspecified: Secondary | ICD-10-CM

## 2018-12-16 MED ORDER — DILTIAZEM HCL ER COATED BEADS 240 MG PO CP24: 240.0000 mg | ORAL_CAPSULE | Freq: Every day | ORAL | 6 refills | Status: AC

## 2018-12-16 NOTE — Patient Instructions (Addendum)
Medication Instructions:  DECREASE Dilitizem 240mg  Take 1 tablet once a day If you need a refill on your cardiac medications before your next appointment, please call your pharmacy.   Lab work: Your physician recommends that you return for lab work in: TODAY-BMET If you have labs (blood work) drawn today and your tests are completely normal, you will receive your results only by: Marland Kitchen MyChart Message (if you have MyChart) OR . A paper copy in the mail If you have any lab test that is abnormal or we need to change your treatment, we will call you to review the results.  Testing/Procedures: None   Follow-Up: At [facility], you and your health needs are our priority.  As part of our continuing mission to provide you with exceptional heart care, we have created designated Provider Care Teams.  These Care Teams include your primary Cardiologist (physician) and Advanced Practice Providers (APPs -  Physician Assistants and Nurse Practitioners) who all work together to provide you with the care you need, when you need it. You will need a follow up appointment in 6 months.  Please call our office 2 months in advance to schedule this appointment.  You may see Shelva Majestic, MD or one of the following Advanced Practice Providers on your designated Care Team: [place], [place] . Fabian Sharp, PA-C  Any Other Special Instructions Will Be Listed Below (If Applicable).
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
## 2018-12-17 LAB — BASIC METABOLIC PANEL
BUN/Creatinine Ratio: 14 (ref 12–28)
BUN: 16 mg/dL (ref 8–27)
CO2: 27 mmol/L (ref 20–29)
Calcium: 10 mg/dL (ref 8.7–10.3)
Chloride: 98 mmol/L (ref 96–106)
Creatinine, Ser: 1.14 mg/dL — ABNORMAL HIGH (ref 0.57–1.00)
GFR calc Af Amer: 54 mL/min/{1.73_m2} — ABNORMAL LOW (ref >59)
GFR calc non Af Amer: 46 mL/min/{1.73_m2} — ABNORMAL LOW (ref >59)
Glucose: 118 mg/dL — ABNORMAL HIGH (ref 65–99)
Potassium: 3 mmol/L — ABNORMAL LOW (ref 3.5–5.2)
Sodium: 144 mmol/L (ref 134–144)

## 2018-12-22 ENCOUNTER — Telehealth: Payer: Self-pay | Admitting: Cardiology

## 2018-12-22 NOTE — Telephone Encounter (Signed)
Called patient, gave lab results.  Patient verbalized understanding. 

## 2018-12-22 NOTE — Telephone Encounter (Signed)
New Message ° ° °Pt is returning call for lab results  ° ° °Please call back  °

## 2019-01-26 ENCOUNTER — Telehealth: Payer: Self-pay | Admitting: *Deleted

## 2019-01-26 NOTE — Telephone Encounter (Signed)
pts daughter called in and stated patient is in a facility and needs appt r/s
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
## 2019-01-26 NOTE — Telephone Encounter (Signed)
LVM advising due to current COVID 19 pandemic, our office is severely reducing in person visits in order to minimize the risk to our patients and healthcare providers. We recommend to convert your appointment to a video visit and can move sooner. She was last seen Jan 2019. Requested she call back to discuss.

## 2019-02-14 ENCOUNTER — Ambulatory Visit: Payer: PPO | Admitting: Diagnostic Neuroimaging

## 2019-03-23 ENCOUNTER — Inpatient Hospital Stay (HOSPITAL_COMMUNITY): Payer: PPO | Admitting: Certified Registered Nurse Anesthetist

## 2019-03-23 ENCOUNTER — Inpatient Hospital Stay (HOSPITAL_COMMUNITY)
Admission: EM | Admit: 2019-03-23 | Discharge: 2019-04-06 | DRG: 480 | Disposition: E | Payer: PPO | Source: Skilled Nursing Facility | Attending: Internal Medicine | Admitting: Internal Medicine

## 2019-03-23 ENCOUNTER — Inpatient Hospital Stay (HOSPITAL_COMMUNITY): Payer: PPO

## 2019-03-23 ENCOUNTER — Emergency Department (HOSPITAL_COMMUNITY): Payer: PPO

## 2019-03-23 ENCOUNTER — Other Ambulatory Visit: Payer: Self-pay

## 2019-03-23 ENCOUNTER — Encounter (HOSPITAL_COMMUNITY): Payer: Self-pay | Admitting: Emergency Medicine

## 2019-03-23 ENCOUNTER — Encounter (HOSPITAL_COMMUNITY): Admission: EM | Disposition: E | Payer: Self-pay | Source: Skilled Nursing Facility | Attending: Internal Medicine

## 2019-03-23 DIAGNOSIS — I2101 ST elevation (STEMI) myocardial infarction involving left main coronary artery: Secondary | ICD-10-CM | POA: Diagnosis not present

## 2019-03-23 DIAGNOSIS — T148XXA Other injury of unspecified body region, initial encounter: Secondary | ICD-10-CM

## 2019-03-23 DIAGNOSIS — S72142A Displaced intertrochanteric fracture of left femur, initial encounter for closed fracture: Principal | ICD-10-CM

## 2019-03-23 DIAGNOSIS — I469 Cardiac arrest, cause unspecified: Secondary | ICD-10-CM | POA: Diagnosis not present

## 2019-03-23 DIAGNOSIS — S72142D Displaced intertrochanteric fracture of left femur, subsequent encounter for closed fracture with routine healing: Secondary | ICD-10-CM | POA: Diagnosis not present

## 2019-03-23 DIAGNOSIS — E876 Hypokalemia: Secondary | ICD-10-CM | POA: Diagnosis present

## 2019-03-23 DIAGNOSIS — E039 Hypothyroidism, unspecified: Secondary | ICD-10-CM | POA: Diagnosis present

## 2019-03-23 DIAGNOSIS — Z888 Allergy status to other drugs, medicaments and biological substances status: Secondary | ICD-10-CM

## 2019-03-23 DIAGNOSIS — W1830XA Fall on same level, unspecified, initial encounter: Secondary | ICD-10-CM | POA: Diagnosis present

## 2019-03-23 DIAGNOSIS — K219 Gastro-esophageal reflux disease without esophagitis: Secondary | ICD-10-CM | POA: Diagnosis not present

## 2019-03-23 DIAGNOSIS — F0390 Unspecified dementia without behavioral disturbance: Secondary | ICD-10-CM | POA: Diagnosis present

## 2019-03-23 DIAGNOSIS — S0990XA Unspecified injury of head, initial encounter: Secondary | ICD-10-CM | POA: Diagnosis not present

## 2019-03-23 DIAGNOSIS — Z79890 Hormone replacement therapy (postmenopausal): Secondary | ICD-10-CM

## 2019-03-23 DIAGNOSIS — Z419 Encounter for procedure for purposes other than remedying health state, unspecified: Secondary | ICD-10-CM

## 2019-03-23 DIAGNOSIS — I480 Paroxysmal atrial fibrillation: Secondary | ICD-10-CM | POA: Diagnosis present

## 2019-03-23 DIAGNOSIS — D72829 Elevated white blood cell count, unspecified: Secondary | ICD-10-CM

## 2019-03-23 DIAGNOSIS — M25552 Pain in left hip: Secondary | ICD-10-CM | POA: Diagnosis not present

## 2019-03-23 DIAGNOSIS — S199XXA Unspecified injury of neck, initial encounter: Secondary | ICD-10-CM | POA: Diagnosis not present

## 2019-03-23 DIAGNOSIS — R404 Transient alteration of awareness: Secondary | ICD-10-CM | POA: Diagnosis not present

## 2019-03-23 DIAGNOSIS — E860 Dehydration: Secondary | ICD-10-CM | POA: Diagnosis not present

## 2019-03-23 DIAGNOSIS — Z8249 Family history of ischemic heart disease and other diseases of the circulatory system: Secondary | ICD-10-CM | POA: Diagnosis not present

## 2019-03-23 DIAGNOSIS — S72002A Fracture of unspecified part of neck of left femur, initial encounter for closed fracture: Secondary | ICD-10-CM | POA: Diagnosis not present

## 2019-03-23 DIAGNOSIS — I248 Other forms of acute ischemic heart disease: Secondary | ICD-10-CM | POA: Diagnosis not present

## 2019-03-23 DIAGNOSIS — Z1159 Encounter for screening for other viral diseases: Secondary | ICD-10-CM | POA: Diagnosis not present

## 2019-03-23 DIAGNOSIS — R570 Cardiogenic shock: Secondary | ICD-10-CM | POA: Diagnosis not present

## 2019-03-23 DIAGNOSIS — I701 Atherosclerosis of renal artery: Secondary | ICD-10-CM | POA: Diagnosis not present

## 2019-03-23 DIAGNOSIS — Z79899 Other long term (current) drug therapy: Secondary | ICD-10-CM | POA: Diagnosis not present

## 2019-03-23 DIAGNOSIS — W19XXXA Unspecified fall, initial encounter: Secondary | ICD-10-CM

## 2019-03-23 DIAGNOSIS — Z823 Family history of stroke: Secondary | ICD-10-CM

## 2019-03-23 DIAGNOSIS — I499 Cardiac arrhythmia, unspecified: Secondary | ICD-10-CM | POA: Diagnosis not present

## 2019-03-23 DIAGNOSIS — I4891 Unspecified atrial fibrillation: Secondary | ICD-10-CM

## 2019-03-23 DIAGNOSIS — Y92129 Unspecified place in nursing home as the place of occurrence of the external cause: Secondary | ICD-10-CM

## 2019-03-23 DIAGNOSIS — E785 Hyperlipidemia, unspecified: Secondary | ICD-10-CM | POA: Diagnosis not present

## 2019-03-23 DIAGNOSIS — I214 Non-ST elevation (NSTEMI) myocardial infarction: Secondary | ICD-10-CM | POA: Diagnosis not present

## 2019-03-23 DIAGNOSIS — I959 Hypotension, unspecified: Secondary | ICD-10-CM | POA: Diagnosis not present

## 2019-03-23 DIAGNOSIS — I21A1 Myocardial infarction type 2: Secondary | ICD-10-CM | POA: Diagnosis not present

## 2019-03-23 DIAGNOSIS — R9431 Abnormal electrocardiogram [ECG] [EKG]: Secondary | ICD-10-CM | POA: Diagnosis not present

## 2019-03-23 DIAGNOSIS — Z7901 Long term (current) use of anticoagulants: Secondary | ICD-10-CM

## 2019-03-23 DIAGNOSIS — I10 Essential (primary) hypertension: Secondary | ICD-10-CM | POA: Diagnosis present

## 2019-03-23 DIAGNOSIS — Z66 Do not resuscitate: Secondary | ICD-10-CM

## 2019-03-23 DIAGNOSIS — R000 Tachycardia, unspecified: Secondary | ICD-10-CM | POA: Diagnosis not present

## 2019-03-23 DIAGNOSIS — Z03818 Encounter for observation for suspected exposure to other biological agents ruled out: Secondary | ICD-10-CM | POA: Diagnosis not present

## 2019-03-23 DIAGNOSIS — S299XXA Unspecified injury of thorax, initial encounter: Secondary | ICD-10-CM | POA: Diagnosis not present

## 2019-03-23 DIAGNOSIS — R52 Pain, unspecified: Secondary | ICD-10-CM | POA: Diagnosis not present

## 2019-03-23 HISTORY — PX: INTRAMEDULLARY (IM) NAIL INTERTROCHANTERIC: SHX5875

## 2019-03-23 LAB — BASIC METABOLIC PANEL
Anion gap: 16 — ABNORMAL HIGH (ref 5–15)
BUN: 16 mg/dL (ref 8–23)
CO2: 22 mmol/L (ref 22–32)
Calcium: 10.2 mg/dL (ref 8.9–10.3)
Chloride: 104 mmol/L (ref 98–111)
Creatinine, Ser: 1.21 mg/dL — ABNORMAL HIGH (ref 0.44–1.00)
GFR calc Af Amer: 50 mL/min — ABNORMAL LOW (ref >60)
GFR calc non Af Amer: 43 mL/min — ABNORMAL LOW (ref >60)
Glucose, Bld: 151 mg/dL — ABNORMAL HIGH (ref 70–99)
Potassium: 3.2 mmol/L — ABNORMAL LOW (ref 3.5–5.1)
Sodium: 142 mmol/L (ref 135–145)

## 2019-03-23 LAB — LACTIC ACID, PLASMA
Lactic Acid, Venous: 4.4 mmol/L (ref 0.5–1.9)
Lactic Acid, Venous: 4.6 mmol/L (ref 0.5–1.9)
Lactic Acid, Venous: 3.9 mmol/L (ref 0.5–1.9)

## 2019-03-23 LAB — CBC WITH DIFFERENTIAL/PLATELET
Abs Immature Granulocytes: 0.06 10*3/uL (ref 0.00–0.07)
Basophils Absolute: 0 10*3/uL (ref 0.0–0.1)
Basophils Relative: 0 %
Eosinophils Absolute: 0 10*3/uL (ref 0.0–0.5)
Eosinophils Relative: 0 %
HCT: 48.1 % — ABNORMAL HIGH (ref 36.0–46.0)
Hemoglobin: 16 g/dL — ABNORMAL HIGH (ref 12.0–15.0)
Immature Granulocytes: 0 %
Lymphocytes Relative: 8 %
Lymphs Abs: 1.2 10*3/uL (ref 0.7–4.0)
MCH: 29.3 pg (ref 26.0–34.0)
MCHC: 33.3 g/dL (ref 30.0–36.0)
MCV: 87.9 fL (ref 80.0–100.0)
Monocytes Absolute: 1 10*3/uL (ref 0.1–1.0)
Monocytes Relative: 6 %
Neutro Abs: 13.2 10*3/uL — ABNORMAL HIGH (ref 1.7–7.7)
Neutrophils Relative %: 86 %
Platelets: 179 10*3/uL (ref 150–400)
RBC: 5.47 MIL/uL — ABNORMAL HIGH (ref 3.87–5.11)
RDW: 13.5 % (ref 11.5–15.5)
WBC: 15.5 10*3/uL — ABNORMAL HIGH (ref 4.0–10.5)
nRBC: 0 % (ref 0.0–0.2)

## 2019-03-23 LAB — MAGNESIUM: Magnesium: 2.4 mg/dL (ref 1.7–2.4)

## 2019-03-23 LAB — TROPONIN I: Troponin I: 0.04 ng/mL (ref <0.03)

## 2019-03-23 LAB — SARS CORONAVIRUS 2: SARS Coronavirus 2: NOT DETECTED

## 2019-03-23 LAB — CK: Total CK: 127 U/L (ref 38–234)

## 2019-03-23 SURGERY — FIXATION, FRACTURE, INTERTROCHANTERIC, WITH INTRAMEDULLARY ROD
Anesthesia: General | Site: Hip | Laterality: Left

## 2019-03-23 MED ORDER — SODIUM CHLORIDE 0.9 % IV BOLUS
500.0000 mL | Freq: Once | INTRAVENOUS | Status: AC
  Administered 2019-03-23: 500 mL via INTRAVENOUS

## 2019-03-23 MED ORDER — PROPOFOL 10 MG/ML IV BOLUS
INTRAVENOUS | Status: DC | PRN
  Administered 2019-03-23: 100 mg via INTRAVENOUS

## 2019-03-23 MED ORDER — DILTIAZEM LOAD VIA INFUSION
10.0000 mg | Freq: Once | INTRAVENOUS | Status: AC
  Administered 2019-03-23: 10 mg via INTRAVENOUS
  Filled 2019-03-23: qty 10

## 2019-03-23 MED ORDER — FENTANYL CITRATE (PF) 100 MCG/2ML IJ SOLN: 25.0000 ug | INTRAMUSCULAR | Status: DC | PRN

## 2019-03-23 MED ORDER — PHENYLEPHRINE HCL (PRESSORS) 10 MG/ML IV SOLN
INTRAVENOUS | Status: DC | PRN
  Administered 2019-03-23: 120 ug via INTRAVENOUS
  Administered 2019-03-23 (×2): 80 ug via INTRAVENOUS

## 2019-03-23 MED ORDER — LACTATED RINGERS IV SOLN
INTRAVENOUS | Status: DC
  Administered 2019-03-23: 10:00:00 via INTRAVENOUS

## 2019-03-23 MED ORDER — ACETAMINOPHEN 650 MG RE SUPP: 650.0000 mg | Freq: Four times a day (QID) | RECTAL | Status: DC | PRN

## 2019-03-23 MED ORDER — DILTIAZEM HCL-DEXTROSE 100-5 MG/100ML-% IV SOLN (PREMIX)
5.0000 mg/h | INTRAVENOUS | Status: DC
  Administered 2019-03-23 – 2019-03-24 (×2): 5 mg/h via INTRAVENOUS
  Filled 2019-03-23 (×2): qty 100

## 2019-03-23 MED ORDER — FENTANYL CITRATE (PF) 100 MCG/2ML IJ SOLN
25.0000 ug | INTRAMUSCULAR | Status: DC | PRN
  Administered 2019-03-23 (×2): 25 ug via INTRAVENOUS

## 2019-03-23 MED ORDER — ACETAMINOPHEN 325 MG PO TABS: 650.0000 mg | ORAL_TABLET | Freq: Four times a day (QID) | ORAL | Status: DC | PRN

## 2019-03-23 MED ORDER — FENTANYL CITRATE (PF) 100 MCG/2ML IJ SOLN
INTRAMUSCULAR | Status: AC
  Administered 2019-03-23: 100 ug
  Filled 2019-03-23: qty 2

## 2019-03-23 MED ORDER — MIDAZOLAM HCL 2 MG/2ML IJ SOLN
INTRAMUSCULAR | Status: AC
  Filled 2019-03-23: qty 2

## 2019-03-23 MED ORDER — ONDANSETRON HCL 4 MG/2ML IJ SOLN: 4.0000 mg | Freq: Four times a day (QID) | INTRAMUSCULAR | Status: DC | PRN

## 2019-03-23 MED ORDER — SODIUM CHLORIDE 0.9 % IV BOLUS
1000.0000 mL | Freq: Once | INTRAVENOUS | Status: AC
  Administered 2019-03-23: 1000 mL via INTRAVENOUS

## 2019-03-23 MED ORDER — MEMANTINE HCL 10 MG PO TABS
10.0000 mg | ORAL_TABLET | Freq: Two times a day (BID) | ORAL | Status: DC
  Administered 2019-03-23 – 2019-03-24 (×2): 10 mg via ORAL
  Filled 2019-03-23 (×3): qty 1

## 2019-03-23 MED ORDER — ONDANSETRON HCL 4 MG/2ML IJ SOLN: 4.0000 mg | Freq: Once | INTRAMUSCULAR | Status: DC | PRN

## 2019-03-23 MED ORDER — ROCURONIUM BROMIDE 10 MG/ML (PF) SYRINGE
PREFILLED_SYRINGE | INTRAVENOUS | Status: DC | PRN
  Administered 2019-03-23: 40 mg via INTRAVENOUS

## 2019-03-23 MED ORDER — DONEPEZIL HCL 5 MG PO TABS
5.0000 mg | ORAL_TABLET | Freq: Every day | ORAL | Status: DC
  Administered 2019-03-23: 5 mg via ORAL
  Filled 2019-03-23 (×2): qty 1

## 2019-03-23 MED ORDER — LIDOCAINE 2% (20 MG/ML) 5 ML SYRINGE
INTRAMUSCULAR | Status: DC | PRN
  Administered 2019-03-23: 60 mg via INTRAVENOUS

## 2019-03-23 MED ORDER — ONDANSETRON HCL 4 MG/2ML IJ SOLN
INTRAMUSCULAR | Status: DC | PRN
  Administered 2019-03-23: 4 mg via INTRAVENOUS

## 2019-03-23 MED ORDER — LACTATED RINGERS IV BOLUS: 1000.0000 mL | Freq: Once | INTRAVENOUS | Status: DC

## 2019-03-23 MED ORDER — POTASSIUM CHLORIDE 10 MEQ/100ML IV SOLN
10.0000 meq | INTRAVENOUS | Status: AC
  Administered 2019-03-23 (×3): 10 meq via INTRAVENOUS
  Filled 2019-03-23 (×3): qty 100

## 2019-03-23 MED ORDER — SUCCINYLCHOLINE CHLORIDE 200 MG/10ML IV SOSY
PREFILLED_SYRINGE | INTRAVENOUS | Status: DC | PRN
  Administered 2019-03-23: 120 mg via INTRAVENOUS

## 2019-03-23 MED ORDER — SENNA 8.6 MG PO TABS
1.0000 | ORAL_TABLET | Freq: Two times a day (BID) | ORAL | Status: DC
  Administered 2019-03-23 – 2019-03-24 (×2): 8.6 mg via ORAL
  Filled 2019-03-23 (×3): qty 1

## 2019-03-23 MED ORDER — ONDANSETRON HCL 4 MG PO TABS: 4.0000 mg | ORAL_TABLET | Freq: Four times a day (QID) | ORAL | Status: DC | PRN

## 2019-03-23 MED ORDER — ALBUMIN HUMAN 5 % IV SOLN
INTRAVENOUS | Status: DC | PRN
  Administered 2019-03-23 (×2): via INTRAVENOUS

## 2019-03-23 MED ORDER — POVIDONE-IODINE 10 % EX SWAB: 2.0000 "application " | Freq: Once | CUTANEOUS | Status: DC

## 2019-03-23 MED ORDER — VANCOMYCIN HCL 1000 MG IV SOLR
INTRAVENOUS | Status: DC | PRN
  Administered 2019-03-23: 1000 mg

## 2019-03-23 MED ORDER — METOPROLOL TARTRATE 25 MG PO TABS
25.0000 mg | ORAL_TABLET | Freq: Two times a day (BID) | ORAL | Status: DC
  Administered 2019-03-23: 25 mg via ORAL
  Filled 2019-03-23 (×3): qty 1

## 2019-03-23 MED ORDER — VANCOMYCIN HCL 1000 MG IV SOLR
INTRAVENOUS | Status: AC
  Filled 2019-03-23: qty 1000

## 2019-03-23 MED ORDER — FENTANYL CITRATE (PF) 100 MCG/2ML IJ SOLN: 50.0000 ug | Freq: Once | INTRAMUSCULAR | Status: DC

## 2019-03-23 MED ORDER — SODIUM CHLORIDE 0.9 % IV SOLN
INTRAVENOUS | Status: DC | PRN
  Administered 2019-03-23: 50 ug/min via INTRAVENOUS

## 2019-03-23 MED ORDER — CEFAZOLIN SODIUM-DEXTROSE 2-4 GM/100ML-% IV SOLN
2.0000 g | INTRAVENOUS | Status: AC
  Administered 2019-03-23: 2 g via INTRAVENOUS
  Filled 2019-03-23: qty 100

## 2019-03-23 MED ORDER — LEVOTHYROXINE SODIUM 75 MCG PO TABS
75.0000 ug | ORAL_TABLET | Freq: Every day | ORAL | Status: DC
  Administered 2019-03-24: 75 ug via ORAL
  Filled 2019-03-23: qty 1

## 2019-03-23 MED ORDER — FENTANYL CITRATE (PF) 100 MCG/2ML IJ SOLN
INTRAMUSCULAR | Status: AC
  Administered 2019-03-23: 25 ug via INTRAVENOUS
  Filled 2019-03-23: qty 2

## 2019-03-23 MED ORDER — CHLORHEXIDINE GLUCONATE 4 % EX LIQD
60.0000 mL | Freq: Once | CUTANEOUS | Status: DC
  Filled 2019-03-23: qty 60

## 2019-03-23 MED ORDER — SUGAMMADEX SODIUM 200 MG/2ML IV SOLN
INTRAVENOUS | Status: DC | PRN
  Administered 2019-03-23: 200 mg via INTRAVENOUS

## 2019-03-23 MED ORDER — FENTANYL CITRATE (PF) 250 MCG/5ML IJ SOLN
INTRAMUSCULAR | Status: DC | PRN
  Administered 2019-03-23 (×2): 50 ug via INTRAVENOUS

## 2019-03-23 MED ORDER — OXYCODONE HCL 5 MG PO TABS: 5.0000 mg | ORAL_TABLET | ORAL | Status: DC | PRN

## 2019-03-23 MED ORDER — MIDAZOLAM HCL 2 MG/2ML IJ SOLN
INTRAMUSCULAR | Status: DC | PRN
  Administered 2019-03-23: 1 mg via INTRAVENOUS

## 2019-03-23 MED ORDER — EZETIMIBE 10 MG PO TABS
10.0000 mg | ORAL_TABLET | Freq: Every day | ORAL | Status: DC
  Administered 2019-03-24: 10 mg via ORAL
  Filled 2019-03-23: qty 1

## 2019-03-23 MED ORDER — LACTATED RINGERS IV SOLN
INTRAVENOUS | Status: DC | PRN
  Administered 2019-03-23 (×2): via INTRAVENOUS

## 2019-03-23 MED ORDER — FENTANYL CITRATE (PF) 250 MCG/5ML IJ SOLN
INTRAMUSCULAR | Status: AC
  Filled 2019-03-23: qty 5

## 2019-03-23 MED ORDER — 0.9 % SODIUM CHLORIDE (POUR BTL) OPTIME
TOPICAL | Status: DC | PRN
  Administered 2019-03-23: 1000 mL

## 2019-03-23 MED ORDER — DEXAMETHASONE SODIUM PHOSPHATE 10 MG/ML IJ SOLN
INTRAMUSCULAR | Status: DC | PRN
  Administered 2019-03-23: 10 mg via INTRAVENOUS

## 2019-03-23 MED ORDER — SODIUM CHLORIDE 0.9 % IV SOLN
INTRAVENOUS | Status: DC
  Administered 2019-03-23: 22:00:00 via INTRAVENOUS

## 2019-03-23 SURGICAL SUPPLY — 46 items
ADH SKN CLS APL DERMABOND .7 (GAUZE/BANDAGES/DRESSINGS) ×1
BIT DRILL FLUTED FEMUR 4.2/3 (BIT) ×1 IMPLANT
BLADE HELICAL TFNA 90 HIP (Anchor) ×1 IMPLANT
BRUSH SCRUB SURG 4.25 DISP (MISCELLANEOUS) ×4 IMPLANT
CHLORAPREP W/TINT 26ML (MISCELLANEOUS) ×2 IMPLANT
COVER PERINEAL POST (MISCELLANEOUS) ×2 IMPLANT
COVER SURGICAL LIGHT HANDLE (MISCELLANEOUS) ×2 IMPLANT
COVER WAND RF STERILE (DRAPES) IMPLANT
DERMABOND ADVANCED (GAUZE/BANDAGES/DRESSINGS) ×1
DERMABOND ADVANCED .7 DNX12 (GAUZE/BANDAGES/DRESSINGS) ×1 IMPLANT
DRAPE C-ARM 35X43 STRL (DRAPES) ×2 IMPLANT
DRAPE IMP U-DRAPE 54X76 (DRAPES) ×4 IMPLANT
DRAPE INCISE IOBAN 66X45 STRL (DRAPES) ×2 IMPLANT
DRAPE ORTHO SPLIT 87X125 STRL (DRAPES) ×1 IMPLANT
DRAPE STERI IOBAN 125X83 (DRAPES) ×2 IMPLANT
DRAPE SURG 17X23 STRL (DRAPES) ×4 IMPLANT
DRAPE U-SHAPE 47X51 STRL (DRAPES) ×2 IMPLANT
DRSG MEPILEX BORDER 4X4 (GAUZE/BANDAGES/DRESSINGS) ×2 IMPLANT
DRSG MEPILEX BORDER 4X8 (GAUZE/BANDAGES/DRESSINGS) ×2 IMPLANT
ELECT REM PT RETURN 9FT ADLT (ELECTROSURGICAL) ×2
ELECTRODE REM PT RTRN 9FT ADLT (ELECTROSURGICAL) ×1 IMPLANT
GLOVE BIO SURGEON STRL SZ 6.5 (GLOVE) ×5 IMPLANT
GLOVE BIO SURGEON STRL SZ7.5 (GLOVE) ×6 IMPLANT
GLOVE BIOGEL PI IND STRL 6.5 (GLOVE) ×1 IMPLANT
GLOVE BIOGEL PI IND STRL 7.5 (GLOVE) ×1 IMPLANT
GLOVE BIOGEL PI INDICATOR 6.5 (GLOVE) ×1
GLOVE BIOGEL PI INDICATOR 7.5 (GLOVE) ×1
GOWN STRL REUS W/ TWL LRG LVL3 (GOWN DISPOSABLE) ×1 IMPLANT
GOWN STRL REUS W/TWL LRG LVL3 (GOWN DISPOSABLE) ×6
GUIDEWIRE 3.2X400 (WIRE) ×1 IMPLANT
KIT BASIN OR (CUSTOM PROCEDURE TRAY) ×2 IMPLANT
KIT TURNOVER KIT B (KITS) ×2 IMPLANT
MANIFOLD NEPTUNE II (INSTRUMENTS) ×2 IMPLANT
NAIL TROCH FIX 10X170 130 (Nail) ×1 IMPLANT
NS IRRIG 1000ML POUR BTL (IV SOLUTION) ×2 IMPLANT
PACK GENERAL/GYN (CUSTOM PROCEDURE TRAY) ×2 IMPLANT
PAD ARMBOARD 7.5X6 YLW CONV (MISCELLANEOUS) ×4 IMPLANT
SCREW LOCK T25 FT 36X5X4.3X (Screw) IMPLANT
SCREW LOCKING 5.0X36MM (Screw) ×2 IMPLANT
SUT MNCRL AB 3-0 PS2 18 (SUTURE) ×2 IMPLANT
SUT VIC AB 0 CT1 27 (SUTURE)
SUT VIC AB 0 CT1 27XBRD ANBCTR (SUTURE) IMPLANT
SUT VIC AB 2-0 CT1 27 (SUTURE) ×2
SUT VIC AB 2-0 CT1 TAPERPNT 27 (SUTURE) ×2 IMPLANT
TOWEL OR 17X26 10 PK STRL BLUE (TOWEL DISPOSABLE) ×4 IMPLANT
WATER STERILE IRR 1000ML POUR (IV SOLUTION) ×2 IMPLANT

## 2019-03-23 NOTE — ED Provider Notes (Signed)
[facility] EMERGENCY DEPARTMENT Provider Note   CSN: [id number] Arrival date & time: 04/05/2019  0636    History   Chief Complaint Chief Complaint  Patient presents with  . Fall    LEVEL 5 CAVEAT 2/2 DEMENTIA  HPI Lindsey Goodman is a 78 y.o. female.     78 year old female with a history of hypertension, dyslipidemia, atrial fibrillation (chronically anticoagulated with Eliquis), PAF, hypothyroid presents to the ED following a fall.  Patient found down at [facility] facility by staff.  Fall was unwitnessed and patient with unknown downtime.  Complaining of left hip pain with deformity noted by EMS.  She has no complaints at present and denies headache, chest pain, shortness of breath, abdominal pain.  She does not recall how she fell or any preceding symptoms to her fall.  Cervical collar placed by EMS in the field.  The history is provided by the patient and the EMS personnel. No language interpreter was used.  Fall    Past Medical History:  Diagnosis Date  . GERD (gastroesophageal reflux disease)   . Hx of Doppler ultrasound 12/09/2010   Renal duplexsuggested distal abdominal aorta segment in the origin of the right and left commom iliac arteries with elevated velocities consistent with at least 50% diameter reduction, 60% narrowing in the right renal artery with stable PSV at 210.  Marland Kitchen Hx of echocardiogram 12/09/2010   EF 55% showed mild concrentric LVH with normal systolic function and grade 1 diastolic dysfunction. she had mild to moderate LA dilation, mild to moderate mitral annular calcification with moderate MR, and she had trival tricupid regurgitation.  . Hyperlipidemia   . Hypertension   . Hypothyroidism   . PAF (paroxysmal atrial fibrillation) ([place]) 07/2018    Patient Active Problem List   Diagnosis Date Noted  . Goals of care, counseling/discussion   . Palliative care by specialist   . Atrial fibrillation, chronic 09/09/2018  . Hyperlipidemia  09/09/2018  . Leukocytosis 09/09/2018  . Dementia ([place]) 04/29/2018  . Acute on chronic renal insufficiency 04/29/2018  . Hypokalemia 04/29/2018  . Fall 04/29/2018  . Abnormal LFTs 04/29/2018  . Memory loss 11/02/2017  . Seasonal allergies 05/10/2015  . Sinus bradycardia 09/05/2014  . Fatigue 02/10/2014  . Essential hypertension 08/04/2013  . Renal artery stenosis ([place]) 08/04/2013  . Hypothyroidism 08/04/2013  . Dyslipidemia 08/04/2013  . GERD (gastroesophageal reflux disease) 08/04/2013  . Lipoma of back 01/16/2012  . Irritable bowel syndrome 04/10/2008    Past Surgical History:  Procedure Laterality Date  . no prior surgery    . WISDOM TOOTH EXTRACTION  1980     OB History   No obstetric history on file.      Home Medications    Prior to Admission medications   Medication Sig Start Date End Date Taking? Authorizing Provider  apixaban (ELIQUIS) 5 MG TABS tablet Take 1 tablet (5 mg total) by mouth 2 (two) times daily. 07/27/18   Troy Sine, MD  diltiazem (CARDIZEM CD) 240 MG 24 hr capsule Take 1 capsule (240 mg total) by mouth daily. 12/16/18   Erlene Quan, PA-C  donepezil (ARICEPT) 5 MG tablet TAKE 1 TABLET BY MOUTH EVERY DAY Patient taking differently: Take 5 mg by mouth at bedtime.  04/20/18   Dennie Bible, NP  ezetimibe (ZETIA) 10 MG tablet TAKE 1 TABLET BY MOUTH EVERY DAY Patient taking differently: Take 10 mg by mouth daily.  08/02/18   Troy Sine, MD  levothyroxine (SYNTHROID, LEVOTHROID) 75 MCG tablet TAKE 1 TABLET (75 MCG TOTAL) BY MOUTH DAILY BEFORE BREAKFAST. 06/28/18   Troy Sine, MD  magnesium oxide (MAG-OX) 400 (241.3 Mg) MG tablet Take 1 tablet (400 mg total) by mouth 2 (two) times daily. 09/17/18   Kayleen Memos, DO  memantine (NAMENDA) 10 MG tablet TAKE 1 TABLET BY MOUTH TWICE A DAY Patient taking differently: Take 10 mg by mouth 2 (two) times daily.  02/15/18   Penumalli, Earlean Polka, MD  metoprolol tartrate (LOPRESSOR) 25 MG tablet  Take 5 tablets (125 mg total) by mouth 2 (two) times daily. Patient taking differently: Take 25 mg by mouth 2 (two) times daily.  09/17/18   Kayleen Memos, DO    Family History Family History  Problem Relation Age of Onset  . Hypertension Mother   . Stroke Mother   . Hypertension Father   . Stroke Father   . Colon cancer Neg Hx   . Stomach cancer Neg Hx     Social History Social History   Tobacco Use  . Smoking status: Never Smoker  . Smokeless tobacco: Never Used  Substance Use Topics  . Alcohol use: No  . Drug use: No     Allergies   Atorvastatin, Amlodipine besylate, and Prednisone   Review of Systems Review of Systems  Unable to perform ROS: Dementia     Physical Exam Updated Vital Signs BP (!) 126/96   Pulse (!) 124   Temp 97.8 F (36.6 C) (Oral)   Resp (!) 22   SpO2 94%   Physical Exam Vitals signs and nursing note reviewed.  Constitutional:      General: She is not in acute distress.    Appearance: She is well-developed. She is not diaphoretic.  HENT:     Head: Normocephalic and atraumatic.     Comments: No battle sign or raccoon's eyes Eyes:     General: No scleral icterus.    Conjunctiva/sclera: Conjunctivae normal.  Neck:     Comments: C-collar in place Cardiovascular:     Rate and Rhythm: Tachycardia present.     Pulses: Normal pulses.     Comments: Irregularly irregular rhythm with tachycardic rate consistent with atrial fibrillation. Pulmonary:     Effort: Pulmonary effort is normal. No respiratory distress.     Comments: Respirations even and unlabored. Musculoskeletal:     Comments: Tenderness to palpation of the left hip.  There is leg shortening and malrotation of the left lower extremity consistent with high likelihood of left hip fracture.  No bony deformity or crepitus appreciated.  Skin:    General: Skin is warm and dry.     Coloration: Skin is not pale.     Findings: No erythema or rash.  Neurological:     General: No  focal deficit present.     Mental Status: She is alert.     Coordination: Coordination normal.     Comments: GCS 15.  Patient answers questions appropriately and follows commands.  Sensation to light touch intact in all extremities.  Psychiatric:        Behavior: Behavior normal.      ED Treatments / Results  Labs (all labs ordered are listed, but only abnormal results are displayed) Labs Reviewed  SARS CORONAVIRUS 2 ([facility])  CBC WITH DIFFERENTIAL/PLATELET  BASIC METABOLIC PANEL  CK  MAGNESIUM    EKG EKG Interpretation  Date/Time:  Wednesday March 23 2019 06:44:41  EDT Ventricular Rate:  140 PR Interval:    QRS Duration: 83 QT Interval:  309 QTC Calculation: 474 R Axis:   32 Text Interpretation:  Atrial fibrillation LVH with secondary repolarization abnormality ST depression, probably rate related No significant change since last tracing Confirmed by Ward, Cyril Mourning [phone]) on 03/19/2019 6:51:40 AM   Radiology No results found.  Procedures .Critical Care Performed by: Antonietta Breach, PA-C Authorized by: Antonietta Breach, PA-C   Critical care provider statement:    Critical care time (minutes):  45   Critical care was necessary to treat or prevent imminent or life-threatening deterioration of the following conditions:  Cardiac failure (Afib w/RVR)   Critical care was time spent personally by me on the following activities:  Discussions with consultants, evaluation of patient's response to treatment, examination of patient, ordering and performing treatments and interventions, ordering and review of laboratory studies, ordering and review of radiographic studies, pulse oximetry, re-evaluation of patient's condition, obtaining history from patient or surrogate and review of old charts   (including critical care time)  Medications Ordered in ED Medications  sodium chloride 0.9 % bolus 500 mL (has no administration in time range)   diltiazem (CARDIZEM) 1 mg/mL load via infusion 10 mg (has no administration in time range)    And  diltiazem (CARDIZEM) 100 mg in dextrose 5% 147mL (1 mg/mL) infusion (has no administration in time range)     Initial Impression / Assessment and Plan / ED Course  I have reviewed the triage vital signs and the nursing notes.  Pertinent labs & imaging results that were available during my care of the patient were reviewed by me and considered in my medical decision making (see chart for details).        78 year old female presents from her facility after an unwitnessed fall with unknown downtime, on Eliquis.  Clinically positive for hip fracture on the left.  Also found to be in atrial fibrillation with RVR.  Started on Cardizem as this is what she takes outpatient.  She is pending imaging as well as labs.  Will require admission.  Patient signed out to Armstead Peaks, PA-C at change of shift who will assume care and disposition appropriately.   Final Clinical Impressions(s) / ED Diagnoses   Final diagnoses:  Closed fracture of left hip, initial encounter [facility])  Atrial fibrillation with RVR [facility])    ED Discharge Orders    None       Antonietta Breach, PA-C 03/22/2019 0786    Ward, Delice Bison, DO 03/30/2019 0715

## 2019-03-23 NOTE — ED Notes (Addendum)
RN attempted to call numbers listed on facesheet form multiple times- no response. PT has dementia and unable to sign for consent. RN called [facility] and asked that they try to get in touch with family and have them call hospital.

## 2019-03-23 NOTE — Plan of Care (Signed)

## 2019-03-23 NOTE — Op Note (Signed)
Orthopaedic Surgery Operative Note (CSN: [id number] ) Date of Surgery: 03/27/2019  Admit Date: 03/08/2019   Diagnoses: Pre-Op Diagnoses: Left intertrochanteric femur fracture   Post-Op Diagnosis: Same  Procedures: CPT 27245-Cephalomedullary nailing of left intertrochanteric femur fracture  Surgeons : Primary: Shona Needles, MD  Assistant: Patrecia Pace, PA-C  Location: OR 3   Anesthesia:General  Antibiotics: Ancef 2g preop   Tourniquet time:None  Estimated Blood Loss: 916 mL  Complications:None  Specimens:None  Implants: Implant Name Type Inv. Item Serial No. Manufacturer Lot No. LRB No. Used Action  NAIL TROCH FIX [id number] 130 - [id number] Nail NAIL TROCH FIX [id number] 130  SYN[employer] [id number] Left 1 Implanted  BLADE HELICAL TFNA 80KL HIP - [id number] Anchor BLADE HELICAL TFNA 69VX HIP  SYN[employer]UMA [id number] Left 1 Implanted  SCREW LOCKING 5.0X36MM - [id number] Screw SCREW LOCKING 5.0X36MM  SYN[employer] TRAUMA [id number] Left 1 Implanted     Indications for Surgery: 78 year old female who presents with a left intertrochanteric femur fracture.  The patient has a history of dementia, A. fib atrial fibrillation on Eliquis, patient also has a history of hypothyroidism had a unwitnessed fall at nursing home. I recommend proceeding with cephalo-medullary nailing of her left intertrochanteric femur fracture.  Patient has a dementia so she is not able to consent. Attempted to call multiple numbers in the chart but no family answered.  I feel that we should proceed with emergent fixation as delayed fixation would increase the risk of complications including pneumonia urinary tract infection and increased bleeding.  We will plan to proceed with a cephalo-medullary fixation.  Operative Findings: Cephalomedullary nailing of left intertrochanteric femur fracture using Synthes short TFN with helical blade  Procedure: The patient was identified in the preoperative holding area. Consent was  confirmed with the patient and their family and all questions were answered. The operative extremity was marked after confirmation with the patient and they were then brought back to the operating room by our anesthesia colleagues. The patient was placed under general anesthesia and then carefully transferred over to a Hana table. The feet were secured into a traction boot and well padded. A post was placed in the groin and traction was pulled on the operative leg. The contralateral leg was positioned out of the way of fluoroscopy and secure . Fluoroscopic images were obtained and traction and manipulation was performed to reduce the fracture. Once adequate reduction was performed then the operative extremity was prepped and draped in sterile fashion. Preincision timeout was performed to verify the patient, the procedure and the extremity. Preoperative antibiotics were dosed.  A small incision was made proximal to the greater trochanter. A curved Mayo scissors was used to spread down to the greater trochanter in line with the abductor musculature. A threaded guidepin was positioned at an appropriate starting point on the AP and lateral views. It was advanced in the femur past the lesser trochanter. A entry reamer with soft tissue protector was then used to enter the canal. A radiographic ruler was used to judge the size of the canal of the femur and a 10 mm short nail was placed into the canal and seated down to an appropriate position radiographically. The targeting arm for the helical blade was attached. A percutaneous incision was made for the guide for the helical blade. A threaded guidepin was placed into the femoral neck and head and fluoroscopy was used to confirm adequate placement with an acceptable tip-apex distance. A drill was used to perforate the lateral cortex and  90 mm helical blade was inserted into the head/neck segment. The set screw was then tightened to set rotation and backed off to allow  compression. The aiming arm was removed from the nail and position of the helical blade was confirmed with fluoroscopy. Using the targeting arm a distal interlock was placed bicortically in the shaft.  Final fluoroscopic images were obtained and the incisions were copiously irrigated. The skin was closed with 2-0 vicryl, 3-0 monocryl and sealed with dermabond. The incisions were dressing with Mepilex dressings. The patient was carefully transferred to the regular floor bed and was taken to PACU in stable condition.   Post Op Plan/Instructions: Patient will be weightbearing as tolerated. She will receive ancef postoperative. She may be restarted on her Eliquis postoperative day 1 if her hemoglobin is stable.  I was present and performed the entire surgery.  Patrecia Pace, PA-C did assist me throughout the case. An assistant was necessary given the difficulty in approach, maintenance of reduction and ability to instrument the fracture.   Katha Hamming, MD [facility]

## 2019-03-23 NOTE — Progress Notes (Signed)
I was unable to contact any family to obtain consent.  I tried multiple times with multiple numbers that are available in the chart.  Apparently the son called and did not provide his number.  As result I will deem this an emergency to proceed with emergent consent.  Without surgical fixation the patient will be at high risk for developing complications including bedsores, pneumonia and urinary tract infections.  She also be in significant pain and would be unable to mobilize.  As result I feel that this is necessary for the patient's quality of life.  Shona Needles, MD [facility] [phone] (phone) [phone] (office) [email or web address]

## 2019-03-23 NOTE — Progress Notes (Signed)
CSW received consult for patient due to patient coming from [facility] and a recent fall causing a left hip fracture. CSW spoke with Jeneen Rinks at [facility] who was hesitant to provide CSW with updated contact information for the patient's family. Jeneen Rinks mentioned the name Sherren Mocha and stated that is the patient's son. Jeneen Rinks would not provide CSW with Todd's contact information and stated that he contacted the family via email and provided them with the number [phone] to call. CSW did confirm that the patient's husband Alvester Chou is deceased.  CSW spoke with patient's RN Hassan Rowan who was prepping patient to go to the OR for surgery. Hassan Rowan reported that she had just spoken with Sherren Mocha to obtain consent for surgery and after he provided verbal consent he would not provide her with his contact number and stated he would "call back in a few hours."   Madilyn Fireman, MSW, [facility] [phone]

## 2019-03-23 NOTE — Anesthesia Procedure Notes (Signed)
Procedure Name: Intubation Date/Time: 03/12/2019 4:20 PM Performed by: Clearnce Sorrel, CRNA Pre-anesthesia Checklist: Patient identified, Emergency Drugs available, Suction available, Patient being monitored and Timeout performed Patient Re-evaluated:Patient Re-evaluated prior to induction Oxygen Delivery Method: Circle system utilized Preoxygenation: Pre-oxygenation with 100% oxygen Induction Type: IV induction Ventilation: Mask ventilation without difficulty and Oral airway inserted - appropriate to patient size Laryngoscope Size: Mac, 3, Miller and 2 Grade View: Grade III Tube type: Oral Tube size: 7.0 mm Number of attempts: 2 Airway Equipment and Method: Stylet and Bougie stylet Placement Confirmation: positive ETCO2 and breath sounds checked- equal and bilateral Secured at: 22 cm Tube secured with: Tape Dental Injury: Teeth and Oropharynx as per pre-operative assessment

## 2019-03-23 NOTE — Anesthesia Preprocedure Evaluation (Signed)
Anesthesia Evaluation  Patient identified by MRN, date of birth, ID band Patient awake    Reviewed: Allergy & Precautions, NPO status , Patient's Chart, lab work & pertinent test results  Airway Mallampati: II  TM Distance: >3 FB Neck ROM: Full    Dental  (+) Teeth Intact, Dental Advisory Given   Pulmonary    breath sounds clear to auscultation       Cardiovascular hypertension,  Rhythm:Irregular Rate:Normal     Neuro/Psych    GI/Hepatic   Endo/Other    Renal/GU      Musculoskeletal   Abdominal   Peds  Hematology   Anesthesia Other Findings   Reproductive/Obstetrics                             Anesthesia Physical Anesthesia Plan  ASA: III  Anesthesia Plan: General   Post-op Pain Management:    Induction: Intravenous  PONV Risk Score and Plan: Ondansetron and Dexamethasone  Airway Management Planned: Oral ETT  Additional Equipment:   Intra-op Plan:   Post-operative Plan: Extubation in OR  Informed Consent: I have reviewed the patients History and Physical, chart, labs and discussed the procedure including the risks, benefits and alternatives for the proposed anesthesia with the patient or authorized representative who has indicated his/her understanding and acceptance.     Dental advisory given  Plan Discussed with: Anesthesiologist and CRNA  Anesthesia Plan Comments:         Anesthesia Quick Evaluation  

## 2019-03-23 NOTE — ED Notes (Signed)
MD paged and responded RE lactic acid.

## 2019-03-23 NOTE — ED Notes (Signed)
RN attempted to call Alvester Chou, husband. No answer

## 2019-03-23 NOTE — ED Provider Notes (Signed)
Signout from previous provider, Antonietta Breach, PA-C at shift change See previous providers note for full H&P  Briefly, patient is demented and had an unwitnessed fall from [facility].  She is in A. fib with RVR with heart rates between 120 and 140.  Cardizem bolus and infusion initiated.  Patient takes Cardizem outpatient.  She is anticoagulated on Eliquis.  Patient has left hip pain, shortening and rotation.  Suspect fracture.  Awaiting labs and imaging.  Physical Exam  BP (!) 126/96   Pulse (!) 124   Temp 97.8 F (36.6 C) (Oral)   Resp (!) 22   SpO2 94%   Physical Exam  ED Course/Procedures     Procedures  8:41 AM Patient's left hip x-ray shows displaced left femoral intratrochanteric fracture.  I discussed this with Hilbert Odor, PA-C with orthopedics who will evaluate the patient.  Patient also with mild elevation in creatinine from 1.14-1.21 as well as hypokalemia at 3.2.  Patient is somewhat hemoconcentrated with hemoglobin at 16.0 as well.  Small fluid bolus and IV potassium initiated.  We will keep n.p.o. at this time.  MDM  Patient presenting following fall.  Found to have left hip fracture.  Remaining imaging is negative for acute findings.  Orthopedics plans to take the patient to surgery today.  Patient also mildly dehydrated with hypokalemia.  Fluids and potassium ordered.  Pulse controlled with Cardizem.  Patient will be admitted to the internal medicine teaching service.  Patient understands and agrees with plan.  Family attempted to be contacted by nursing staff without success.  Patient was asking for husband, however he may be deceased?  Patient reported that she lives with her husband, however is a resident [facility].  Patient also evaluated by Dr. Leonides Schanz on previous shift.        Frederica Kuster, PA-C 03/18/2019 1216    Ward, Delice Bison, DO 03/31/2019 2321

## 2019-03-23 NOTE — ED Notes (Addendum)
RN attempted to call all numbers on face sheet. HUSBAND MAY BE DECEASED. SON TODD. BUT NO ANSWER TO ANY NUMBER LISTED

## 2019-03-23 NOTE — Transfer of Care (Signed)
Immediate Anesthesia Transfer of Care Note  Patient: Lindsey Goodman  Procedure(s) Performed: INTRAMEDULLARY (IM) NAIL INTERTROCHANTRIC (Left Hip)  Patient Location: PACU  Anesthesia Type:General  Level of Consciousness: awake and alert   Airway & Oxygen Therapy: Patient Spontanous Breathing and Patient connected to face mask oxygen  Post-op Assessment: Report given to RN and Post -op Vital signs reviewed and stable  Post vital signs: Reviewed and stable  Last Vitals:  Vitals Value Taken Time  BP 137/83 04/03/2019 1754  Temp    Pulse 61 03/16/2019 1754  Resp 20 04/04/2019 1756  SpO2 62 % 03/10/2019 1754  Vitals shown include unvalidated device data.  Last Pain:  Vitals:   03/23/19 0651  TempSrc:   PainSc: 0-No pain         Complications: No apparent anesthesia complications

## 2019-03-23 NOTE — ED Notes (Signed)
Called [facility] to get information about family . Ellwood Dense [phone], left a message,  Ernest Haber- [phone] phone was not a working number. Relayed message to nurse.

## 2019-03-23 NOTE — ED Notes (Signed)
ED TO INPATIENT HANDOFF REPORT  ED Nurse Name and Phone #: Hassan Rowan [phone]  S Name/Age/Gender Lindsey Goodman 78 y.o. female Room/Bed: 030C/030C  Code Status   Code Status: DNR  Home/SNF/Other Nursing Home Patient oriented to: self Is this baseline? Yes      Chief Complaint fall; hip injury   Triage Note [facility], ems reports patient had an unwitnessed fall, unknown how long patient was down, c/o L hip pain, shortening and rotation present. Patient currently on eliquis, hx of afib/ rvr and dementia. Currently in afib on the monitor, facility reports patient is at her baseline mental status. Alert and oriented to person and place only. c collar in place.    Allergies Allergies  Allergen Reactions  . Atorvastatin Other (See Comments)    Pt states she felt confused.  . Amlodipine Besylate Swelling    Takes at home  . Prednisone     No appetite    Level of Care/Admitting Diagnosis ED Disposition    ED Disposition Condition [facility] Area: [place] [100100]  Level of Care: Progressive [102]  Covid Evaluation: Screening Protocol (No Symptoms)  Diagnosis: Closed left hip fracture, initial encounter [facility]) [[id number]]  Admitting Physician: Aldine Contes [phone]  Attending Physician: Aldine Contes [phone]  Estimated length of stay: past midnight tomorrow  Certification:: I certify this patient will need inpatient services for at least 2 midnights  PT Class (Do Not Modify): Inpatient [101]  PT Acc Code (Do Not Modify): Private [1]       B Medical/Surgery History Past Medical History:  Diagnosis Date  . GERD (gastroesophageal reflux disease)   . Hx of Doppler ultrasound 12/09/2010   Renal duplexsuggested distal abdominal aorta segment in the origin of the right and left commom iliac arteries with elevated velocities consistent with at least 50% diameter reduction, 60% narrowing in the right renal  artery with stable PSV at 210.  Marland Kitchen Hx of echocardiogram 12/09/2010   EF 55% showed mild concrentric LVH with normal systolic function and grade 1 diastolic dysfunction. she had mild to moderate LA dilation, mild to moderate mitral annular calcification with moderate MR, and she had trival tricupid regurgitation.  . Hyperlipidemia   . Hypertension   . Hypothyroidism   . PAF (paroxysmal atrial fibrillation) ([place]) 07/2018   Past Surgical History:  Procedure Laterality Date  . no prior surgery    . [place]     A IV Location/Drains/Wounds Patient Lines/Drains/Airways Status   Active Line/Drains/Airways    Name:   Placement date:   Placement time:   Site:   Days:   Peripheral IV 03/20/2019 Left Wrist   03/13/2019    0706    Wrist   less than 1   External Urinary Catheter   09/09/18    -    -   195          Intake/Output Last 24 hours  Intake/Output Summary (Last 24 hours) at 03/12/2019 1306 Last data filed at 03/15/2019 1142 Gross per 24 hour  Intake 100 ml  Output -  Net 100 ml    Labs/Imaging Results for orders placed or performed during the hospital encounter of 03/18/2019 (from the past 48 hour(s))  CBC with Differential     Status: Abnormal   Collection Time: 03/23/19  7:08 AM  Result Value Ref Range   WBC 15.5 (H) 4.0 - 10.5 K/uL   RBC  5.47 (H) 3.87 - 5.11 MIL/uL   Hemoglobin 16.0 (H) 12.0 - 15.0 g/dL   HCT 48.1 (H) 36.0 - 46.0 %   MCV 87.9 80.0 - 100.0 fL   MCH 29.3 26.0 - 34.0 pg   MCHC 33.3 30.0 - 36.0 g/dL   RDW 13.5 11.5 - 15.5 %   Platelets 179 150 - 400 K/uL   nRBC 0.0 0.0 - 0.2 %   Neutrophils Relative % 86 %   Neutro Abs 13.2 (H) 1.7 - 7.7 K/uL   Lymphocytes Relative 8 %   Lymphs Abs 1.2 0.7 - 4.0 K/uL   Monocytes Relative 6 %   Monocytes Absolute 1.0 0.1 - 1.0 K/uL   Eosinophils Relative 0 %   Eosinophils Absolute 0.0 0.0 - 0.5 K/uL   Basophils Relative 0 %   Basophils Absolute 0.0 0.0 - 0.1 K/uL   Immature Granulocytes 0 %   Abs  Immature Granulocytes 0.06 0.00 - 0.07 K/uL    Comment: Performed at [facility] [address] [place], [place] [address]  Basic metabolic panel     Status: Abnormal   Collection Time: 03/10/2019  7:08 AM  Result Value Ref Range   Sodium 142 135 - 145 mmol/L   Potassium 3.2 (L) 3.5 - 5.1 mmol/L   Chloride 104 98 - 111 mmol/L   CO2 22 22 - 32 mmol/L   Glucose, Bld 151 (H) 70 - 99 mg/dL   BUN 16 8 - 23 mg/dL   Creatinine, Ser 1.21 (H) 0.44 - 1.00 mg/dL   Calcium 10.2 8.9 - 10.3 mg/dL   GFR calc non Af Amer 43 (L) >60 mL/min   GFR calc Af Amer 50 (L) >60 mL/min   Anion gap 16 (H) 5 - 15    Comment: Performed at [facility], [place] [address] [place], [place] [address]  CK     Status: None   Collection Time: 03/21/2019  7:08 AM  Result Value Ref Range   Total CK 127 38 - 234 U/L    Comment: Performed at [facility], [place] [address], [place], [place] [address]  Magnesium     Status: None   Collection Time: 03/19/2019  7:08 AM  Result Value Ref Range   Magnesium 2.4 1.7 - 2.4 mg/dL    Comment: Performed at [facility] [address], [place], [place] [address]  SARS Coronavirus 2     Status: None   Collection Time: 03/13/2019  7:30 AM  Result Value Ref Range   SARS Coronavirus 2 NOT DETECTED NOT DETECTED    Comment: (NOTE) SARS-CoV-2 target nucleic acids are NOT DETECTED. The SARS-CoV-2 RNA is generally detectable in upper and lower respiratory specimens during the acute phase of infection.  Negative  results do not preclude SARS-CoV-2 infection, do not rule out co-infections with other pathogens, and should not be used as the sole basis for treatment or other patient management decisions.  Negative results must be combined with clinical observations, patient history, and epidemiological information. The expected result is Not Detected. Fact Sheet for Patients: http://www.biofiredefense.com/wp-content/uploads/2020/03/BIOFIRE-COVID -19-patients.pdf Fact Sheet  for Healthcare Providers: http://www.biofiredefense.com/wp-content/uploads/2020/03/BIOFIRE-COVID -19-hcp.pdf This test is not yet approved or cleared by the [place] and  has been authorized for detection and/or diagnosis of SARS-CoV-2 by FDA under an Emergency Use Authorization (EUA).  This EUA will remain in effec t (meaning this test can be used) for the duration of  the COVID-19 declaration under Section  564(b)(1) of the Act, 21 U.S.C. section 360bbb-3(b)(1), unless the authorization is terminated or revoked sooner. Performed at [facility], [place] [address], [place], [place] [address]   Troponin I - Now Then Q6H     Status: Abnormal   Collection Time: 03/13/2019 10:04 AM  Result Value Ref Range   Troponin I 0.03 (HH) <0.03 ng/mL    Comment: CRITICAL RESULT CALLED TO, READ BACK BY AND VERIFIED WITH: C.COLIN,RN 03/16/2019 DAVISB Performed at [facility], [place] [address], [place], [place] [address]   Lactic acid, plasma     Status: Abnormal   Collection Time: 03/28/2019 10:40 AM  Result Value Ref Range   Lactic Acid, Venous 4.4 (HH) 0.5 - 1.9 mmol/L    Comment: CRITICAL RESULT CALLED TO, READ BACK BY AND VERIFIED WITH: B.Kamiya Acord,RN 1208 04/04/2019 CLARK,S Performed at [facility] [address], [place], [place] [address]    Ct Head Wo Contrast  Result Date: 04/03/2019 CLINICAL DATA:  Minor trauma.  Patient altered. EXAM: CT HEAD WITHOUT CONTRAST CT CERVICAL SPINE WITHOUT CONTRAST TECHNIQUE: Multidetector CT imaging of the head and cervical spine was performed following the standard protocol without intravenous contrast. Multiplanar CT image reconstructions of the cervical spine were also generated. COMPARISON:  September 09, 2018 FINDINGS: CT HEAD FINDINGS Brain: No subdural, epidural, or subarachnoid hemorrhage. Cerebellum, brainstem, and basal cisterns are normal. Ventricles and sulci are unchanged with no acute abnormalities. White matter changes remain.  No acute cortical ischemia or infarct. No mass effect or midline shift. Vascular: Calcified atherosclerosis in the intracranial carotids. Skull: Normal. Negative for fracture or focal lesion. Sinuses/Orbits: No acute finding. Other: None. CT CERVICAL SPINE FINDINGS Alignment: There is mild anterolisthesis of C4 versus C5 measuring approximately 2.6 mm today, unchanged. There is minimal retrolisthesis of C5 versus C6 which is also stable. No acute malalignment. Skull base and vertebrae: No acute fracture. No primary bone lesion or focal pathologic process. Soft tissues and spinal canal: No prevertebral fluid or swelling. No visible canal hematoma. Disc levels: Multilevel degenerative disc disease and facet degenerative changes. Upper chest: There is a ground-glass nodule in the medial right apex measuring 6 mm which is stable. No other acute abnormalities are identified in the lung apices. Other: No other abnormalities are identified. IMPRESSION: 1. No acute intracranial abnormalities. Chronic white matter changes. 2. No fracture or acute traumatic malalignment in the cervical spine. Multilevel degenerative changes. Electronically Signed   By: Dorise Bullion III M.D   On: 04/05/2019 09:03   Ct Cervical Spine Wo Contrast  Result Date: 03/30/2019 CLINICAL DATA:  Minor trauma.  Patient altered. EXAM: CT HEAD WITHOUT CONTRAST CT CERVICAL SPINE WITHOUT CONTRAST TECHNIQUE: Multidetector CT imaging of the head and cervical spine was performed following the standard protocol without intravenous contrast. Multiplanar CT image reconstructions of the cervical spine were also generated. COMPARISON:  September 09, 2018 FINDINGS: CT HEAD FINDINGS Brain: No subdural, epidural, or subarachnoid hemorrhage. Cerebellum, brainstem, and basal cisterns are normal. Ventricles and sulci are unchanged with no acute abnormalities. White matter changes remain. No acute cortical ischemia or infarct. No mass effect or midline shift. Vascular:  Calcified atherosclerosis in the intracranial carotids. Skull: Normal. Negative for fracture or focal lesion. Sinuses/Orbits: No acute finding. Other: None. CT CERVICAL SPINE FINDINGS Alignment: There is mild anterolisthesis of C4 versus C5 measuring approximately 2.6 mm today, unchanged. There is minimal retrolisthesis of C5 versus C6 which is also stable. No acute malalignment. Skull base and vertebrae:  No acute fracture. No primary bone lesion or focal pathologic process. Soft tissues and spinal canal: No prevertebral fluid or swelling. No visible canal hematoma. Disc levels: Multilevel degenerative disc disease and facet degenerative changes. Upper chest: There is a ground-glass nodule in the medial right apex measuring 6 mm which is stable. No other acute abnormalities are identified in the lung apices. Other: No other abnormalities are identified. IMPRESSION: 1. No acute intracranial abnormalities. Chronic white matter changes. 2. No fracture or acute traumatic malalignment in the cervical spine. Multilevel degenerative changes. Electronically Signed   By: Dorise Bullion III M.D   On: 03/08/2019 09:03   Dg Chest Port 1 View  Result Date: 03/09/2019 CLINICAL DATA:  Unwitnessed fall with left hip pain. Dementia. AFib. EXAM: PORTABLE CHEST 1 VIEW COMPARISON:  09/08/2018 FINDINGS: Lungs are adequately inflated and otherwise clear. Mild stable cardiomegaly. Remainder the exam is unchanged. IMPRESSION: No acute findings. Electronically Signed   By: Marin Olp M.D.   On: 03/15/2019 08:15   Dg Hip Port Unilat With Pelvis 1v Left  Result Date: 03/17/2019 CLINICAL DATA:  Unwitnessed fall with left hip pain.  Dementia. EXAM: DG HIP (WITH OR WITHOUT PELVIS) 1V PORT LEFT COMPARISON:  None. FINDINGS: Mild diffuse decreased bone mineralization. Mild symmetric degenerative change of the hips. There is a displaced intertrochanteric fracture of the left hip with superolateral displacement of the distal fragment and  exaggerated coxa vara deformity about the fracture site. Displaced lesser trochanteric fragment. Degenerative change of the spine and sacroiliac joints. IMPRESSION: Displaced left femoral intertrochanteric fracture. Electronically Signed   By: Marin Olp M.D.   On: 03/07/2019 08:14    Pending Labs Unresulted Labs (From admission, onward)    Start     Ordered   03/27/19 0500  CBC  Tomorrow morning,   R     03/18/2019 1009   03-27-19 8182  Basic metabolic panel  Tomorrow morning,   R     03/25/2019 1009   03/17/2019 1201  TSH  Once,   R     04/05/2019 1201   03/16/2019 1040  Troponin I - Now Then Q6H  Now then every 6 hours,   R     03/27/2019 1040   03/19/2019 1040  Lactic acid, plasma  STAT Now then every 3 hours,   R     03/13/2019 1040   03/22/2019 1039  Urinalysis, Routine w reflex microscopic  Once,   STAT     03/25/2019 1040          Vitals/Pain Today's Vitals   03/21/2019 0845 04/01/2019 0900 04/05/2019 0930 03/14/2019 1030  BP: (!) 145/86 (!) 142/100 (!) 148/125 (!) 128/94  Pulse: 75 (!) 57  70  Resp: 16 16 13 15   Temp:      TempSrc:      SpO2: 100% 100%  99%  PainSc:        Isolation Precautions No active isolations  Medications Medications  diltiazem (CARDIZEM) 1 mg/mL load via infusion 10 mg (10 mg Intravenous Bolus from Bag 03/25/2019 0712)    And  diltiazem (CARDIZEM) 100 mg in dextrose 5% 150mL (1 mg/mL) infusion (10 mg/hr Intravenous Rate/Dose Change 03/29/2019 0909)  fentaNYL (SUBLIMAZE) injection 50 mcg (has no administration in time range)  potassium chloride 10 mEq in 100 mL IVPB (0 mEq Intravenous Stopped 03/23/19 1142)  ezetimibe (ZETIA) tablet 10 mg (has no administration in time range)  metoprolol tartrate (LOPRESSOR) tablet 25 mg (has no administration in time range)  donepezil (ARICEPT) tablet 5 mg (has no administration in time range)  memantine (NAMENDA) tablet 10 mg (has no administration in time range)  levothyroxine (SYNTHROID) tablet 75 mcg (has no administration in  time range)  acetaminophen (TYLENOL) tablet 650 mg (has no administration in time range)    Or  acetaminophen (TYLENOL) suppository 650 mg (has no administration in time range)  senna (SENOKOT) tablet 8.6 mg (has no administration in time range)  ondansetron (ZOFRAN) tablet 4 mg (has no administration in time range)    Or  ondansetron (ZOFRAN) injection 4 mg (has no administration in time range)  oxyCODONE (Oxy IR/ROXICODONE) immediate release tablet 5 mg (has no administration in time range)  fentaNYL (SUBLIMAZE) injection 25 mcg (has no administration in time range)  chlorhexidine (HIBICLENS) 4 % liquid 4 application (has no administration in time range)  povidone-iodine 10 % swab 2 application (has no administration in time range)  ceFAZolin (ANCEF) IVPB 2g/100 mL premix (has no administration in time range)  0.9 %  sodium chloride infusion (has no administration in time range)  sodium chloride 0.9 % bolus 1,000 mL (has no administration in time range)  sodium chloride 0.9 % bolus 500 mL (0 mLs Intravenous Stopped 04/03/2019 0808)  fentaNYL (SUBLIMAZE) 100 MCG/2ML injection (100 mcg  Given 03/08/2019 0737)  sodium chloride 0.9 % bolus 500 mL (0 mLs Intravenous Stopped 03/17/2019 1017)    Mobility walks     Focused Assessments Muskuloskeletal   R Recommendations: See Admitting Provider Note  Report given to:   Additional Notes:

## 2019-03-23 NOTE — H&P (Addendum)
Date: 03/09/2019               Patient Name:  Lindsey Goodman MRN: [id number]  DOB: [birth year] Age / Sex: 78 y.o., female   PCP: Patient, No Pcp Per         Medical Service: Internal Medicine Teaching Service         Attending Physician: Dr. Aldine Contes, MD    First Contact: Dr. Sherry Ruffing Pager: [phone]  Second Contact: Dr. Maricela Bo Pager: [phone]       After Hours (After 5p/  First Contact Pager: [phone]  weekends / holidays): Second Contact Pager: [phone]   Chief Complaint: hip fracture  History of Present Illness:  Ms. Syracuse is a 78yo female with PMH of dementia, a fib, hypothyroidism presenting to [facility] after an unwitnessed fall at [facility].  Patient states she doesn't remember falling. She denies feeling weak, poor PO intake, dizziness, chest pain, shortness of breath, palpitations, headaches, numbness/tingling, headaches, fevers, chills, nausea, vomiting, abdominal pain, diarrhea, dysuria, or hematuria today or in the last couple of days. She endorses pain in her left hip with movement currently.   I attempted calling husband at both numbers listed but was unsuccessful.  I reached out to [facility] assisted living; staff who had found patient had already left for the day. Apparently patient was found down. She should have been seen at dinner time last night when her food was delivered to her room. Staff member states she has not noticed anything concerning about the patient in the last few days, reports she has been eating well, and has not been complaining about anything bothering her. She was able to confirm patient had a gold DNR form on file that was not sent with patient on transfer to ED and will fax it to [place]. She states she has also not been able to reach family yet.   In the ED, patient was found to be in a fib with RVR, with stable blood pressure, no fever, and saturating well on room air. CBC reveals leukocytosis to 15.5, Hgb 16 (previous 12.7), Plts 179. Bmet  reveals Na 142, K 3.2, Cl 104, bicarb 22, BUN 16, Cr 1.21 (b/l 1-1.15), Glu 151 with anion gap of 16. CK was 127. Mag 2.4. CXR without acute findings. EKG revealed atrial fibrillation with RVR with inferolateral ST depressions. CT head/neck negative for acute findings. Left hip xray revealed a displaced left femoral intertrochanteric fracture. She was given 1000cc NS bolus, pain addressed with fentanyl prn, started on diltiazem drip. Orthopedics consulted for fracture. IMTS asked to admit.  Meds:  Current Meds  Medication Sig  . apixaban (ELIQUIS) 5 MG TABS tablet Take 1 tablet (5 mg total) by mouth 2 (two) times daily.  Marland Kitchen diltiazem (CARDIZEM CD) 240 MG 24 hr capsule Take 1 capsule (240 mg total) by mouth daily.  Marland Kitchen donepezil (ARICEPT) 5 MG tablet TAKE 1 TABLET BY MOUTH EVERY DAY (Patient taking differently: Take 5 mg by mouth at bedtime. )  . ezetimibe (ZETIA) 10 MG tablet TAKE 1 TABLET BY MOUTH EVERY DAY (Patient taking differently: Take 10 mg by mouth daily. )  . levothyroxine (SYNTHROID, LEVOTHROID) 75 MCG tablet TAKE 1 TABLET (75 MCG TOTAL) BY MOUTH DAILY BEFORE BREAKFAST.  . magnesium oxide (MAG-OX) 400 (241.3 Mg) MG tablet Take 1 tablet (400 mg total) by mouth 2 (two) times daily.  . memantine (NAMENDA) 10 MG tablet TAKE 1 TABLET BY MOUTH TWICE A DAY (Patient taking differently:  Take 10 mg by mouth 2 (two) times daily. )  . metoprolol tartrate (LOPRESSOR) 25 MG tablet Take 5 tablets (125 mg total) by mouth 2 (two) times daily. (Patient taking differently: Take 25 mg by mouth 2 (two) times daily. )    Allergies: Allergies as of 03/15/2019 - Review Complete 03/07/2019  Allergen Reaction Noted  . Atorvastatin Other (See Comments) 08/11/2016  . Amlodipine besylate Swelling   . Prednisone     Past Medical History:  Diagnosis Date  . GERD (gastroesophageal reflux disease)   . Hx of Doppler ultrasound 12/09/2010   Renal duplexsuggested distal abdominal aorta segment in the origin of the  right and left commom iliac arteries with elevated velocities consistent with at least 50% diameter reduction, 60% narrowing in the right renal artery with stable PSV at 210.  Marland Kitchen Hx of echocardiogram 12/09/2010   EF 55% showed mild concrentric LVH with normal systolic function and grade 1 diastolic dysfunction. she had mild to moderate LA dilation, mild to moderate mitral annular calcification with moderate MR, and she had trival tricupid regurgitation.  . Hyperlipidemia   . Hypertension   . Hypothyroidism   . PAF (paroxysmal atrial fibrillation) ([place]) 07/2018    Family History: HTN and strokes in both parents, who are deceased.  Social History: Lives at assisted living 2/2 dementia. No tobacco, alcohol or illicit drug use.   Review of Systems: A complete ROS was negative except as per HPI.   Physical Exam: GENERAL- alert, co-operative, appears as stated age, not in any distress. HEENT- Atraumatic, normocephalic, PERRL, EOMI, oral mucosa appears dry CARDIAC- tachycardic, irregularly irregular, no murmurs, rubs or gallops. RESP- Moving equal volumes of air, and clear to auscultation bilaterally on anterior lung fields, no wheezes or crackles. ABDOMEN- Soft, nontender, bowel sounds present. NEURO- PERRL, EOMI, no facial asymmetry, sensation intact throughout. Strength intact in bil UEs. Able to wiggle both sets of toes but pain and immobilization limits further evaluation of LE strength. EXTREMITIES- pulse 2+ DP, symmetric, no pedal edema. Left LE shorter and with internal rotation. SKIN- Warm, dry. PSYCH- Normal mood and affect, appropriate thought content and speech.  EKG: personally reviewed my interpretation is a fib with RVR, inferolateral ST depression  CXR: personally reviewed my interpretation is without infiltrate, consolidation, pleural effusion or pneumothorax  CT head/neck 03/08/2019: 1. No acute intracranial abnormalities. Chronic white matter changes. 2. No fracture or  acute traumatic malalignment in the cervical spine. Multilevel degenerative changes.  Left hip xray 03/23/2019: Mild diffuse decreased bone mineralization. Mild symmetric degenerative change of the hips. There is a displaced intertrochanteric fracture of the left hip with superolateral displacement of the distal fragment and exaggerated coxa vara deformity about the fracture site. Displaced lesser trochanteric fragment. Degenerative change of the spine and sacroiliac joints.  Assessment & Plan by Problem: Principal Problem:   Closed left hip fracture, initial encounter [facility]) Active Problems:   Hypothyroidism   Dementia (HCC)   Atrial fibrillation with RVR (HCC)  Fall Atrial fibrillation with RVR: Patient presents with unwitnessed fall at assisted living facility, found to be in a fib in RVR on arrival. From what history was able to be obtained and current workup, no particular trigger for fall or a fib with RVR other than possible dehydration based on what appears to be a hemoconcentrated CBC. Still in RVR despite 1L NS bolus in the ED to address possible dehydration. Other than fracture no other clear source of leukocytosis. She does have inferolateral ST depressions on  EKG which could be from demand but cannot at this time rule out ACS as cause of presentation.  --continue dilt drip for now; plan to transition to home dose of 240mg  daily later today or tomorrow if rates can get controlled on drip --restart home metoprolol 25mg  BID  --f/u TSH as she has hypothyroidism on synthroid --f/u UA --trend troponins --if this workup is negative, can consider echo to evaluate for new structural changes --NPO except meds for now, so will start LR 64ml/hr  --on eliquis prior to admission, last dose was last night; hold this morning until we have plan for surgery  Displaced left femoral intertrochanteric fracture Fragility fracture: Patient with presumed ground level fall resulting in hip  fracture which is consistent with fragility fracture and presumed osteoporosis. XR also confirms mild diffuse decreased bone mineralization.  --f/u ortho recs --f/u vit D level --can start calcium/vit D supplementation this admission or on discharge; will have patient f/u with primary care to discuss DEXA scan and starting bisphosphonate therapy when deemed appropriate by ortho (usually about 6 wks after surgery) --PT/OT consult starting tomorrow --SW consult for likely SNF placement  Hypothyroidism: On synthroid 48mcg daily. --continue --f/u TSH  Dementia: Continue home donepezil 5mg  daily and memantine 10mg  BID  Diet: NPO, sips with meds IVF: LR 1ml/hr while NPO VTE ppx: hold eliquis until surgery plan in place Code: DNR per gold form from facility  Dispo: Admit patient to Inpatient with expected length of stay greater than 2 midnights.  Signed: Alphonzo Grieve, MD 03/22/2019, 10:11 AM

## 2019-03-23 NOTE — ED Triage Notes (Signed)
Pt arrives via [facility] from [facility], ems reports patient had an unwitnessed fall, unknown how long patient was down, c/o L hip pain, shortening and rotation present. Patient currently on eliquis, hx of afib/ rvr and dementia. Currently in afib on the monitor, facility reports patient is at her baseline mental status. Alert and oriented to person and place only. c collar in place.

## 2019-03-23 NOTE — ED Notes (Signed)
ED Provider at bedside. 

## 2019-03-23 NOTE — Consult Note (Signed)
Reason for Consult:Left hip fx Referring Physician: Burt Ek is an 78 y.o. female.  HPI: Lindsey Goodman got up last night and fell. She is unsure of the circumstances surrounding the fall. She lives in a facility. She was brought to the ED where x-rays showed a left intertroch hip fx and orthopedic surgery was consulted. She c/o pain in the left hip.  Past Medical History:  Diagnosis Date  . GERD (gastroesophageal reflux disease)   . Hx of Doppler ultrasound 12/09/2010   Renal duplexsuggested distal abdominal aorta segment in the origin of the right and left commom iliac arteries with elevated velocities consistent with at least 50% diameter reduction, 60% narrowing in the right renal artery with stable PSV at 210.  Marland Kitchen Hx of echocardiogram 12/09/2010   EF 55% showed mild concrentric LVH with normal systolic function and grade 1 diastolic dysfunction. she had mild to moderate LA dilation, mild to moderate mitral annular calcification with moderate MR, and she had trival tricupid regurgitation.  . Hyperlipidemia   . Hypertension   . Hypothyroidism   . PAF (paroxysmal atrial fibrillation) ([facility]) 07/2018    Past Surgical History:  Procedure Laterality Date  . no prior surgery    . WISDOM TOOTH EXTRACTION  1980    Family History  Problem Relation Age of Onset  . Hypertension Mother   . Stroke Mother   . Hypertension Father   . Stroke Father   . Colon cancer Neg Hx   . Stomach cancer Neg Hx     Social History:  reports that she has never smoked. She has never used smokeless tobacco. She reports that she does not drink alcohol or use drugs.  Allergies:  Allergies  Allergen Reactions  . Atorvastatin Other (See Comments)    Pt states she felt confused.  . Amlodipine Besylate Swelling    Takes at home  . Prednisone     No appetite    Medications: I have reviewed the patient's current medications.  Results for orders placed or performed during the hospital encounter of  03/07/2019 (from the past 48 hour(s))  CBC with Differential     Status: Abnormal   Collection Time: 03/14/2019  7:08 AM  Result Value Ref Range   WBC 15.5 (H) 4.0 - 10.5 K/uL   RBC 5.47 (H) 3.87 - 5.11 MIL/uL   Hemoglobin 16.0 (H) 12.0 - 15.0 g/dL   HCT 48.1 (H) 36.0 - 46.0 %   MCV 87.9 80.0 - 100.0 fL   MCH 29.3 26.0 - 34.0 pg   MCHC 33.3 30.0 - 36.0 g/dL   RDW 13.5 11.5 - 15.5 %   Platelets 179 150 - 400 K/uL   nRBC 0.0 0.0 - 0.2 %   Neutrophils Relative % 86 %   Neutro Abs 13.2 (H) 1.7 - 7.7 K/uL   Lymphocytes Relative 8 %   Lymphs Abs 1.2 0.7 - 4.0 K/uL   Monocytes Relative 6 %   Monocytes Absolute 1.0 0.1 - 1.0 K/uL   Eosinophils Relative 0 %   Eosinophils Absolute 0.0 0.0 - 0.5 K/uL   Basophils Relative 0 %   Basophils Absolute 0.0 0.0 - 0.1 K/uL   Immature Granulocytes 0 %   Abs Immature Granulocytes 0.06 0.00 - 0.07 K/uL    Comment: Performed at [facility], [address] [place], [place] [address]  Basic metabolic panel     Status: Abnormal   Collection Time: 03/19/2019  7:08 AM  Result  Value Ref Range   Sodium 142 135 - 145 mmol/L   Potassium 3.2 (L) 3.5 - 5.1 mmol/L   Chloride 104 98 - 111 mmol/L   CO2 22 22 - 32 mmol/L   Glucose, Bld 151 (H) 70 - 99 mg/dL   BUN 16 8 - 23 mg/dL   Creatinine, Ser 1.21 (H) 0.44 - 1.00 mg/dL   Calcium 10.2 8.9 - 10.3 mg/dL   GFR calc non Af Amer 43 (L) >60 mL/min   GFR calc Af Amer 50 (L) >60 mL/min   Anion gap 16 (H) 5 - 15    Comment: Performed at [facility] [address], [place], [place] [address]  CK     Status: None   Collection Time: 03/15/2019  7:08 AM  Result Value Ref Range   Total CK 127 38 - 234 U/L    Comment: Performed at [facility], [place] [address], [place], [place] [address]  Magnesium     Status: None   Collection Time: 03/10/2019  7:08 AM  Result Value Ref Range   Magnesium 2.4 1.7 - 2.4 mg/dL    Comment: Performed at [facility] [address] [place], [place] [address]  SARS  Coronavirus 2     Status: None   Collection Time: 03/22/2019  7:30 AM  Result Value Ref Range   SARS Coronavirus 2 NOT DETECTED NOT DETECTED    Comment: (NOTE) SARS-CoV-2 target nucleic acids are NOT DETECTED. The SARS-CoV-2 RNA is generally detectable in upper and lower respiratory specimens during the acute phase of infection.  Negative  results do not preclude SARS-CoV-2 infection, do not rule out co-infections with other pathogens, and should not be used as the sole basis for treatment or other patient management decisions.  Negative results must be combined with clinical observations, patient history, and epidemiological information. The expected result is Not Detected. Fact Sheet for Patients: http://www.biofiredefense.com/wp-content/uploads/2020/03/BIOFIRE-COVID -19-patients.pdf Fact Sheet for Healthcare Providers: http://www.biofiredefense.com/wp-content/uploads/2020/03/BIOFIRE-COVID -19-hcp.pdf This test is not yet approved or cleared by the [place] and  has been authorized for detection and/or diagnosis of SARS-CoV-2 by FDA under an Emergency Use Authorization (EUA).  This EUA will remain in effec t (meaning this test can be used) for the duration of  the COVID-19 declaration under Section 564(b)(1) of the Act, 21 U.S.C. section 360bbb-3(b)(1), unless the authorization is terminated or revoked sooner. Performed at [facility], [place] [address] [place], [place] [address]     Ct Head Wo Contrast  Result Date: 03/25/2019 CLINICAL DATA:  Minor trauma.  Patient altered. EXAM: CT HEAD WITHOUT CONTRAST CT CERVICAL SPINE WITHOUT CONTRAST TECHNIQUE: Multidetector CT imaging of the head and cervical spine was performed following the standard protocol without intravenous contrast. Multiplanar CT image reconstructions of the cervical spine were also generated. COMPARISON:  September 09, 2018 FINDINGS: CT HEAD FINDINGS Brain: No subdural, epidural, or subarachnoid  hemorrhage. Cerebellum, brainstem, and basal cisterns are normal. Ventricles and sulci are unchanged with no acute abnormalities. White matter changes remain. No acute cortical ischemia or infarct. No mass effect or midline shift. Vascular: Calcified atherosclerosis in the intracranial carotids. Skull: Normal. Negative for fracture or focal lesion. Sinuses/Orbits: No acute finding. Other: None. CT CERVICAL SPINE FINDINGS Alignment: There is mild anterolisthesis of C4 versus C5 measuring approximately 2.6 mm today, unchanged. There is minimal retrolisthesis of C5 versus C6 which is also stable. No acute malalignment. Skull base and vertebrae: No acute fracture. No primary bone  lesion or focal pathologic process. Soft tissues and spinal canal: No prevertebral fluid or swelling. No visible canal hematoma. Disc levels: Multilevel degenerative disc disease and facet degenerative changes. Upper chest: There is a ground-glass nodule in the medial right apex measuring 6 mm which is stable. No other acute abnormalities are identified in the lung apices. Other: No other abnormalities are identified. IMPRESSION: 1. No acute intracranial abnormalities. Chronic white matter changes. 2. No fracture or acute traumatic malalignment in the cervical spine. Multilevel degenerative changes. Electronically Signed   By: Dorise Bullion III M.D   On: 03/21/2019 09:03   Ct Cervical Spine Wo Contrast  Result Date: 03/13/2019 CLINICAL DATA:  Minor trauma.  Patient altered. EXAM: CT HEAD WITHOUT CONTRAST CT CERVICAL SPINE WITHOUT CONTRAST TECHNIQUE: Multidetector CT imaging of the head and cervical spine was performed following the standard protocol without intravenous contrast. Multiplanar CT image reconstructions of the cervical spine were also generated. COMPARISON:  September 09, 2018 FINDINGS: CT HEAD FINDINGS Brain: No subdural, epidural, or subarachnoid hemorrhage. Cerebellum, brainstem, and basal cisterns are normal. Ventricles and  sulci are unchanged with no acute abnormalities. White matter changes remain. No acute cortical ischemia or infarct. No mass effect or midline shift. Vascular: Calcified atherosclerosis in the intracranial carotids. Skull: Normal. Negative for fracture or focal lesion. Sinuses/Orbits: No acute finding. Other: None. CT CERVICAL SPINE FINDINGS Alignment: There is mild anterolisthesis of C4 versus C5 measuring approximately 2.6 mm today, unchanged. There is minimal retrolisthesis of C5 versus C6 which is also stable. No acute malalignment. Skull base and vertebrae: No acute fracture. No primary bone lesion or focal pathologic process. Soft tissues and spinal canal: No prevertebral fluid or swelling. No visible canal hematoma. Disc levels: Multilevel degenerative disc disease and facet degenerative changes. Upper chest: There is a ground-glass nodule in the medial right apex measuring 6 mm which is stable. No other acute abnormalities are identified in the lung apices. Other: No other abnormalities are identified. IMPRESSION: 1. No acute intracranial abnormalities. Chronic white matter changes. 2. No fracture or acute traumatic malalignment in the cervical spine. Multilevel degenerative changes. Electronically Signed   By: Dorise Bullion III M.D   On: 03/22/2019 09:03   Dg Chest Port 1 View  Result Date: 03/31/2019 CLINICAL DATA:  Unwitnessed fall with left hip pain. Dementia. AFib. EXAM: PORTABLE CHEST 1 VIEW COMPARISON:  09/08/2018 FINDINGS: Lungs are adequately inflated and otherwise clear. Mild stable cardiomegaly. Remainder the exam is unchanged. IMPRESSION: No acute findings. Electronically Signed   By: Marin Olp M.D.   On: 03/12/2019 08:15   Dg Hip Port Unilat With Pelvis 1v Left  Result Date: 03/17/2019 CLINICAL DATA:  Unwitnessed fall with left hip pain.  Dementia. EXAM: DG HIP (WITH OR WITHOUT PELVIS) 1V PORT LEFT COMPARISON:  None. FINDINGS: Mild diffuse decreased bone mineralization. Mild  symmetric degenerative change of the hips. There is a displaced intertrochanteric fracture of the left hip with superolateral displacement of the distal fragment and exaggerated coxa vara deformity about the fracture site. Displaced lesser trochanteric fragment. Degenerative change of the spine and sacroiliac joints. IMPRESSION: Displaced left femoral intertrochanteric fracture. Electronically Signed   By: Marin Olp M.D.   On: 04/05/2019 08:14    Review of Systems  Constitutional: Negative for weight loss.  HENT: Negative for ear discharge, ear pain, hearing loss and tinnitus.   Eyes: Negative for blurred vision, double vision, photophobia and pain.  Respiratory: Negative for cough, sputum production and shortness of breath.  Cardiovascular: Negative for chest pain.  Gastrointestinal: Negative for abdominal pain, nausea and vomiting.  Genitourinary: Negative for dysuria, flank pain, frequency and urgency.  Musculoskeletal: Positive for joint pain (Left hip). Negative for back pain, falls, myalgias and neck pain.  Neurological: Negative for dizziness, tingling, sensory change, focal weakness, loss of consciousness and headaches.  Endo/Heme/Allergies: Does not bruise/bleed easily.  Psychiatric/Behavioral: Negative for depression, memory loss and substance abuse. The patient is not nervous/anxious.    Blood pressure (!) 148/125, pulse (!) 57, temperature 97.8 F (36.6 C), temperature source Oral, resp. rate 13, SpO2 100 %. Physical Exam  Constitutional: She appears well-developed and well-nourished. No distress.  HENT:  Head: Normocephalic and atraumatic.  Eyes: Conjunctivae are normal. Right eye exhibits no discharge. Left eye exhibits no discharge. No scleral icterus.  Neck: Normal range of motion.  Cardiovascular: Normal rate and regular rhythm.  Respiratory: Effort normal. No respiratory distress.  Musculoskeletal:     Comments: LLE No traumatic wounds, ecchymosis, or  rash  Nontender, severe pain with PROM  No knee or ankle effusion  Knee stable to varus/ valgus and anterior/posterior stress  Sens DPN, SPN, TN intact  Motor EHL, ext, flex, evers 5/5  DP 2+, PT 2+, No significant edema  Neurological: She is alert.  Skin: Skin is warm and dry. She is not diaphoretic.  Psychiatric: She has a normal mood and affect. Her behavior is normal.    Assessment/Plan: Left hip fx -- Plan IMN by Dr. Doreatha Martin this afternoon. Please keep NPO. Multiple medical problems including dementia, a fib on Eliquis, and hypothyroidism -- per primary service. Please hold Eliquis until after surgery    Lisette Abu, PA-C Orthopedic Surgery [phone] 03/20/2019, 10:22 AM

## 2019-03-24 ENCOUNTER — Inpatient Hospital Stay (HOSPITAL_COMMUNITY): Payer: PPO

## 2019-03-24 ENCOUNTER — Encounter (HOSPITAL_COMMUNITY): Payer: Self-pay | Admitting: Student

## 2019-03-24 DIAGNOSIS — I21A1 Myocardial infarction type 2: Secondary | ICD-10-CM

## 2019-03-24 DIAGNOSIS — I214 Non-ST elevation (NSTEMI) myocardial infarction: Secondary | ICD-10-CM

## 2019-03-24 DIAGNOSIS — R9431 Abnormal electrocardiogram [ECG] [EKG]: Secondary | ICD-10-CM

## 2019-03-24 DIAGNOSIS — I4891 Unspecified atrial fibrillation: Secondary | ICD-10-CM

## 2019-03-24 DIAGNOSIS — R570 Cardiogenic shock: Secondary | ICD-10-CM

## 2019-03-24 DIAGNOSIS — I469 Cardiac arrest, cause unspecified: Secondary | ICD-10-CM

## 2019-03-24 DIAGNOSIS — I248 Other forms of acute ischemic heart disease: Secondary | ICD-10-CM

## 2019-03-24 LAB — URINALYSIS, ROUTINE W REFLEX MICROSCOPIC
Bilirubin Urine: NEGATIVE
Glucose, UA: NEGATIVE mg/dL
Hgb urine dipstick: NEGATIVE
Ketones, ur: NEGATIVE mg/dL
Leukocytes,Ua: NEGATIVE
Nitrite: NEGATIVE
Protein, ur: NEGATIVE mg/dL
Specific Gravity, Urine: 1.017 (ref 1.005–1.030)
pH: 6 (ref 5.0–8.0)

## 2019-03-24 LAB — CBC
HCT: 26.7 % — ABNORMAL LOW (ref 36.0–46.0)
HCT: 29.2 % — ABNORMAL LOW (ref 36.0–46.0)
Hemoglobin: 8.8 g/dL — ABNORMAL LOW (ref 12.0–15.0)
Hemoglobin: 9.3 g/dL — ABNORMAL LOW (ref 12.0–15.0)
MCH: 29.7 pg (ref 26.0–34.0)
MCH: 29.7 pg (ref 26.0–34.0)
MCHC: 33 g/dL (ref 30.0–36.0)
MCHC: 31.8 g/dL (ref 30.0–36.0)
MCV: 90.2 fL (ref 80.0–100.0)
MCV: 93.3 fL (ref 80.0–100.0)
Platelets: 177 10*3/uL (ref 150–400)
Platelets: 202 10*3/uL (ref 150–400)
RBC: 2.96 MIL/uL — ABNORMAL LOW (ref 3.87–5.11)
RBC: 3.13 MIL/uL — ABNORMAL LOW (ref 3.87–5.11)
RDW: 14.2 % (ref 11.5–15.5)
RDW: 14.6 % (ref 11.5–15.5)
WBC: 10.4 10*3/uL (ref 4.0–10.5)
WBC: 10.6 10*3/uL — ABNORMAL HIGH (ref 4.0–10.5)
nRBC: 0 % (ref 0.0–0.2)
nRBC: 0 % (ref 0.0–0.2)

## 2019-03-24 LAB — BASIC METABOLIC PANEL
Anion gap: 9 (ref 5–15)
BUN: 15 mg/dL (ref 8–23)
CO2: 24 mmol/L (ref 22–32)
Calcium: 9.3 mg/dL (ref 8.9–10.3)
Chloride: 109 mmol/L (ref 98–111)
Creatinine, Ser: 1.12 mg/dL — ABNORMAL HIGH (ref 0.44–1.00)
GFR calc Af Amer: 55 mL/min — ABNORMAL LOW (ref >60)
GFR calc non Af Amer: 47 mL/min — ABNORMAL LOW (ref >60)
Glucose, Bld: 160 mg/dL — ABNORMAL HIGH (ref 70–99)
Potassium: 4.6 mmol/L (ref 3.5–5.1)
Sodium: 142 mmol/L (ref 135–145)

## 2019-03-24 LAB — TROPONIN I
Troponin I: 0.03 ng/mL (ref <0.03)
Troponin I: 1.55 ng/mL (ref <0.03)
Troponin I: 2.45 ng/mL (ref <0.03)

## 2019-03-24 LAB — LACTIC ACID, PLASMA
Lactic Acid, Venous: 3.5 mmol/L (ref 0.5–1.9)
Lactic Acid, Venous: >11 mmol/L (ref 0.5–1.9)

## 2019-03-24 LAB — TSH: TSH: 0.773 u[IU]/mL (ref 0.350–4.500)

## 2019-03-24 MED ORDER — AMIODARONE LOAD VIA INFUSION
150.0000 mg | Freq: Once | INTRAVENOUS | Status: AC
  Administered 2019-03-24: 150 mg via INTRAVENOUS
  Filled 2019-03-24: qty 83.34

## 2019-03-24 MED ORDER — LACTATED RINGERS IV BOLUS
1000.0000 mL | Freq: Once | INTRAVENOUS | Status: AC
  Administered 2019-03-24: 1000 mL via INTRAVENOUS

## 2019-03-24 MED ORDER — METOPROLOL TARTRATE 5 MG/5ML IV SOLN: 5.0000 mg | Freq: Four times a day (QID) | INTRAVENOUS | Status: DC

## 2019-03-24 MED ORDER — SODIUM CHLORIDE 0.9 % IV SOLN: 250.0000 mL | INTRAVENOUS | Status: DC

## 2019-03-24 MED ORDER — LACTATED RINGERS IV SOLN: INTRAVENOUS | Status: DC

## 2019-03-24 MED ORDER — HEPARIN (PORCINE) 25000 UT/250ML-% IV SOLN
700.0000 [IU]/h | INTRAVENOUS | Status: DC
  Filled 2019-03-24: qty 250

## 2019-03-24 MED ORDER — METOPROLOL TARTRATE 5 MG/5ML IV SOLN
5.0000 mg | Freq: Four times a day (QID) | INTRAVENOUS | Status: DC
  Administered 2019-03-24: 5 mg via INTRAVENOUS
  Filled 2019-03-24: qty 5

## 2019-03-24 MED ORDER — PHENYLEPHRINE 40 MCG/ML (10ML) SYRINGE FOR IV PUSH (FOR BLOOD PRESSURE SUPPORT)
200.0000 ug | PREFILLED_SYRINGE | Freq: Once | INTRAVENOUS | Status: AC
  Administered 2019-03-24: 200 ug via INTRAVENOUS
  Filled 2019-03-24: qty 10

## 2019-03-24 MED ORDER — SODIUM CHLORIDE 0.9 % IV SOLN: INTRAVENOUS | Status: DC | PRN

## 2019-03-24 MED ORDER — AMIODARONE HCL IN DEXTROSE 360-4.14 MG/200ML-% IV SOLN: 30.0000 mg/h | INTRAVENOUS | Status: DC

## 2019-03-24 MED ORDER — AMIODARONE HCL IN DEXTROSE 360-4.14 MG/200ML-% IV SOLN
60.0000 mg/h | INTRAVENOUS | Status: DC
  Filled 2019-03-24: qty 200

## 2019-03-24 MED ORDER — NOREPINEPHRINE 4 MG/250ML-% IV SOLN
2.0000 ug/min | INTRAVENOUS | Status: DC
  Filled 2019-03-24 (×2): qty 250

## 2019-03-25 ENCOUNTER — Telehealth: Payer: Self-pay | Admitting: Cardiovascular Disease

## 2019-03-25 NOTE — Telephone Encounter (Signed)
Spoke with Jeneen Rinks at [facility] and offered her the alternate numbers that we have listed for family members. She was appreciative for the call. Will forward to Dr. Claiborne Billings for his knowledge.

## 2019-03-25 NOTE — Telephone Encounter (Signed)
Noted. Thank you!  Sorry to hear of loss.

## 2019-03-25 NOTE — Telephone Encounter (Signed)
New Message      Jeneen Rinks is the Director at [facility] and says the pt had passed away and they have been unable to reach any family.  He is wanted to leave a message with Dr Angelina Sheriff the family reaches out here

## 2019-03-28 NOTE — Telephone Encounter (Signed)
Thank you for letting me know

## 2019-04-06 NOTE — Progress Notes (Addendum)
Pt arrived on unit at 1109, unresponsive and hemodynamically ustable. MD at bedside. Orders received and acknowledge. Pt DNR code status. Pronounced time of death at 1133.  Multiple attempts to call family but they are unreachable. Social work on the case and trying to get [place] of family.  Dianna from SNF, [facility], called and got updated.  Post-mortem care done.

## 2019-04-06 NOTE — Progress Notes (Signed)
PT Cancellation Note  Patient Details Name: Lindsey Goodman MRN: [id number] DOB: 01-07-1941   Cancelled Treatment:      Reason Eval/Treat Not Completed: Medical issues which prohibited therapy. Nursing is requesting that we hold patient today for PT evaluation due to elevated Troponin level that is trending upward.    Carney Living PT DPT  March 26, 2019, 11:15 AM

## 2019-04-06 NOTE — Significant Event (Addendum)
Rapid Response Event Note  Overview: Cardiac - EKG Changes, Ongoing AF, and Hypotension  Initial Focused Assessment: Called by charge nurse about patient having EKG changes coupled with low blood pressures. IMTS MDs were already at the bedside and Cardiology Team was present when I arrived. Patient was somnolent, would awake to her name being called, she denies pain, + history of dementia, does not appear in over distress. Skin was cool and mildly clammy. EKG from this morning showed AF with significant elevation in aVR and diffuse ST depressions. Troponin 0.03 > 0.04>1.55>2.45. SBP was in the 70s with MAPs in the 50s, 110-140 AF. Oxygen saturations maintaining on RA. Plan discussed with Cards Team and IMTS Team. Code Status - DNR  Interventions: -- Patient received 1L LR bolus, 2nd LR bolus initiated  -- Heparin Infusion -- Amiodarone Bolus 150 mg to be given over 1 hour, then Load and Continuous drip orders were placed  -- Peripheral Phenylephrine push doses were given. Patient was given a total of 200 mcg over the course of 30-40 minutes with no improvement in blood pressure. Telemetry was showing worsening changes on heart rhythm. SBP remained in the 60-70s, MAPs in the 50s.  -- IMTS MD present the entire time, decision made to transfer the patient to [place] for continuous Vasopressors and Arterial Line insertion for hemodynamic monitoring. -- I was called to a medical emergency at 1055, instructed the nursing staff and MD to transfer the patient to [place].   Plan of Care: -- Transferred to [place]. When I returned, patient was no longer awake, per nursing staff had no gag, + agonal breaths, BP dropped along with HR, patient became asystolic. TOD called by IMTS (they were present at the bedside), confirmed no heart tones/no breath sounds, HR 0.  -- Unfortunately medical and nursing staff have been unable to contact the family for past two days. Several attempts were made to contact the next of kin.     Of note, Ms. Forrey died surrounded by nurses and doctors, though we are not her family, she passed away surrounded by people who cared about her. May the family find comfort in that.   Event Summary:  Call Time [place]  Locke Barrell R

## 2019-04-06 NOTE — Anesthesia Postprocedure Evaluation (Signed)
Anesthesia Post Note  Patient: Lindsey Goodman  Procedure(s) Performed: INTRAMEDULLARY (IM) NAIL INTERTROCHANTRIC (Left Hip)     Patient location during evaluation: PACU Anesthesia Type: General Level of consciousness: awake and alert Pain management: pain level controlled Vital Signs Assessment: post-procedure vital signs reviewed and stable Respiratory status: spontaneous breathing, nonlabored ventilation, respiratory function stable and patient connected to nasal cannula oxygen Cardiovascular status: blood pressure returned to baseline and stable Postop Assessment: no apparent nausea or vomiting Anesthetic complications: no    Last Vitals:  Vitals:   03/27/2019 1130 2019/03/27 1133  BP: (!) 62/32   Pulse:    Resp: (!) 9 (!) 0  Temp:    SpO2:      Last Pain:  Vitals:   Mar 27, 2019 0819  TempSrc: Oral  PainSc: 0-No pain                 Yolandra Habig DAVID

## 2019-04-06 NOTE — Progress Notes (Signed)
Notified on call internal medicine for patient high troponin

## 2019-04-06 NOTE — Progress Notes (Signed)
OT Cancellation Note  Patient Details Name: Lindsey Goodman MRN: [id number] DOB: September 19, 1941   Cancelled Treatment:    Reason Eval/Treat Not Completed: Medical issues which prohibited therapy. Nursing is requesting that we hold patient today for OT evaluation due to elevated Troponin level that is trending upward.    Ailene Ravel, OTR/L,CBIS  [phone]   March 26, 2019, 11:04 AM

## 2019-04-06 NOTE — Significant Event (Signed)
Update - Communication with Family  I received a call from Ms. Crofford's son on the Rapid Response Line at 1423.  I am unsure as to how he got in touch with me, but since I knew Ms. Mayall's case, I asked him to give me his contact info (his number was blocked on caller ID) and I would have the IMTS team reach out to him, he refused to me give the number, so I proceeded to find the IMTS MDs for the day, it took several minutes to get them from where I was, so I kindly asked him to stay on the line and he did.  I found Dr. Maricela Bo and informed her that Ms. Transue son was on the phone, asked if she could talk to him. Dr. Maricela Bo informed him that Ms. Lepke has passed away, she went over the events of her hospitalization as well.   We gave Ms. Surber son that the contact information to the [facility] who could then direct him with information on how to make arrangements for Ms. Mailhot.   Call Time 1423.   Bodhi Stenglein R

## 2019-04-06 NOTE — Progress Notes (Signed)
  Echocardiogram 2D Echocardiogram has been attempted. Patient vitals unstable. Nurse stated to reattempt later this afternoon or tomorrow.  Randa Lynn Sherhonda Gaspar 03/26/2019, 10:42 AM

## 2019-04-06 NOTE — Progress Notes (Signed)
ANTICOAGULATION CONSULT NOTE - Initial Consult  Pharmacy Consult for Heparin Indication: chest pain/ACS  Allergies  Allergen Reactions  . Atorvastatin Other (See Comments)    Pt states she felt confused.  . Amlodipine Besylate Swelling    Takes at home  . Prednisone     No appetite    Patient Measurements: Height: 5\' 5"  (165.1 cm)(from 12/16/18) Weight: 124 lb 1.9 oz (56.3 kg) IBW/kg (Calculated) : 57 Heparin Dosing Weight: 56.3 kg  Vital Signs: Temp: 98.5 F (36.9 C) (06/18 0819) Temp Source: Oral (06/18 0819) BP: 66/48 (06/18 1030) Pulse Rate: 36 (06/18 1023)  Labs: Recent Labs    04/04/2019 0708  03/28/2019 1957 03/31/19 0315 March 31, 2019 0749  HGB 16.0*  --   --  8.8* 9.3*  HCT 48.1*  --   --  26.7* 29.2*  PLT 179  --   --  177 202  CREATININE 1.21*  --   --  1.12*  --   CKTOTAL 127  --   --   --   --   TROPONINI  --    < > 0.04* 1.55* 2.45*   < > = values in this interval not displayed.    Estimated Creatinine Clearance: 37.4 mL/min (A) (by C-G formula based on SCr of 1.12 mg/dL (H)).   Medical History: Past Medical History:  Diagnosis Date  . GERD (gastroesophageal reflux disease)   . Hx of Doppler ultrasound 12/09/2010   Renal duplexsuggested distal abdominal aorta segment in the origin of the right and left commom iliac arteries with elevated velocities consistent with at least 50% diameter reduction, 60% narrowing in the right renal artery with stable PSV at 210.  Marland Kitchen Hx of echocardiogram 12/09/2010   EF 55% showed mild concrentric LVH with normal systolic function and grade 1 diastolic dysfunction. she had mild to moderate LA dilation, mild to moderate mitral annular calcification with moderate MR, and she had trival tricupid regurgitation.  . Hyperlipidemia   . Hypertension   . Hypothyroidism   . PAF (paroxysmal atrial fibrillation) ([place]) 07/2018    Medications:  Medications Prior to Admission  Medication Sig Dispense Refill Last Dose  . apixaban  (ELIQUIS) 5 MG TABS tablet Take 1 tablet (5 mg total) by mouth 2 (two) times daily. 180 tablet 3 03/22/2019 at 2000  . diltiazem (CARDIZEM CD) 240 MG 24 hr capsule Take 1 capsule (240 mg total) by mouth daily. 30 capsule 6 03/22/2019 at Unknown time  . donepezil (ARICEPT) 5 MG tablet TAKE 1 TABLET BY MOUTH EVERY DAY (Patient taking differently: Take 5 mg by mouth at bedtime. ) 90 tablet 2 03/22/2019 at Unknown time  . ezetimibe (ZETIA) 10 MG tablet TAKE 1 TABLET BY MOUTH EVERY DAY (Patient taking differently: Take 10 mg by mouth daily. ) 90 tablet 3 03/22/2019 at Unknown time  . levothyroxine (SYNTHROID, LEVOTHROID) 75 MCG tablet TAKE 1 TABLET (75 MCG TOTAL) BY MOUTH DAILY BEFORE BREAKFAST. 30 tablet 3 03/20/2019 at Unknown time  . magnesium oxide (MAG-OX) 400 (241.3 Mg) MG tablet Take 1 tablet (400 mg total) by mouth 2 (two) times daily. 20 tablet 0 03/22/2019 at Unknown time  . memantine (NAMENDA) 10 MG tablet TAKE 1 TABLET BY MOUTH TWICE A DAY (Patient taking differently: Take 10 mg by mouth 2 (two) times daily. ) 180 tablet 3 03/22/2019 at Unknown time  . metoprolol tartrate (LOPRESSOR) 25 MG tablet Take 5 tablets (125 mg total) by mouth 2 (two) times daily. (Patient taking differently: Take 25  mg by mouth 2 (two) times daily. ) 30 tablet 0 03/22/2019 at 2000    Assessment: 78 y.o female to start on IV heparin now for ACS/STEMI On Apixaban PTA, last dose reported taken 03/22/19 at 20:00 PTA> Hgb down from admit 16.0 to 9.3 , pltc 179> 202k.     Goal of Therapy:  Heparin level 0.3-0.7 units/ml aPTT 66-102 seconds Monitor platelets by anticoagulation protocol: Yes   Plan:  STAT baseline aPTT and heparin level. Heparin drip at 700 units/hr  (no bolus due to previously taking Apixaban prior to admission) F/u 8 hour heparin level  Daily HL, CBC  Thank you for allowing pharmacy to be part of this patients care team. Nicole Cella, RPh Clinical Pharmacist [phone] Pager: [phone] Please check AMION  for all [place] phone numbers After 10:00 PM, call [place] [phone] 2019-04-01,10:39 AM

## 2019-04-06 NOTE — Progress Notes (Signed)
Pt transferred to 2H04 due to decline in her condition. Report called and given to [facility] RN by this RN. MD aware of the transfer.

## 2019-04-06 NOTE — Discharge Summary (Signed)
°  Name: Lindsey Goodman MRN: [id number] DOB: May 26, 1941 78 y.o.  Date of Admission: 03/30/2019  6:36 AM Date of Discharge: Apr 10, 2019 Attending Physician: Aldine Contes  Discharge Diagnosis: Principal Problem:   Closed left hip fracture, initial encounter [facility]) Active Problems:   Hypothyroidism   Dementia ([place])   Atrial fibrillation with RVR ([place])   Cause of death: Cardiac arrest Time of death: 11:33 AM  Disposition and follow-up:   [name] was discharged from [facility] in expired condition.    Hospital Course: This is a 78 year old female with a history of dementia, a fib, hypothyroidism who presented after an unwitnessed fall at the SNF. She was noted to be in a fib with RVR and found to have a displaced left femoral intertrochanteric fracture. Labs significant for leukocytosis of 15 and Hgb 16 (baseline around 12), initial troponin of 0.03 > 0.04. and LA of 4.4. Patient was started on a diltiazem drip and pain controlled with fentanyl. Ortho was consulted in the ED, patient was unable to provide consent and no family members were able to be reached to provide consent, given the risk of complications including pneumonia, UTI, and increased bleeding ortho proceeded with an emergent fixation of the fracture. Overnight patient had a troponin increase to 1.5. She was noted to be tachycardic, nurse was contacted and patient had not been on the diltiazem drip overnight so this was restarted and obtained EKG. EKG showed worsening ST depressions in lateral lead with RVR, concerning for ischemic event. Diltiazem drip was discontinued at that time and she was given a small dose of metoprolol for her tachycardia. Cardiology consulted and discussed case at bedside. It was difficult to assess EKG changes were related to cardiac disease vs demand ischemia. Attempted to reach family multiple times during this discussion. It was decided to treat patient medically at this time,  discussed with ortho and they were okay to start heparin drip. Diltiazem was discontinued and started on amiodarone bolus. Patient started becoming hypotensive and we tried doing neo-synephrine bolus on the floor with little response. Transferred to the ICU to start on levophed. Unfortunately patients blood pressure continued to decrease, mental status worsening, and she was noted to have no gag reflex and having agonal breathing, HR declined and patient became asystolic. Time of death was 11:33 AM. Initially family was unable to be reached but patients son contacted hospital later in the day, he was made aware of what happened and all questions were answered.    Signed: [name], MD 04-10-19, 1:26 PM

## 2019-04-06 NOTE — Progress Notes (Signed)
Subjective:   Patient is drowsy/lethargic. She is alert and able to answer questions. She denies any chest pain or shortness of breath.   Objective:  Vital signs in last 24 hours: Vitals:   2019-04-16 0044 04-16-2019 0444 04-16-19 0506 04/16/19 0600  BP: 126/82 125/88    Pulse: (!) 126 (!) 124    Resp:    18  Temp: 98.1 F (36.7 C) 98 F (36.7 C) 98.1 F (36.7 C)   TempSrc: Oral Oral Oral   SpO2: 98% 96%      General: Tired appearing, laying in bed, elderly female Cardiac: Tachycardic, no appreciable murmurs, rubs or gallosp Pulmonary: CTABL, no wheezing, rhonchi, or rales Abdomen: Soft, non-tender, non-distended Extremity: No LE edema, bandage over left hip, warm to touch Neuro: Awake, answers questions, moves all extremities    Assessment/Plan:  Principal Problem:   Closed left hip fracture, initial encounter ([place]) Active Problems:   Hypothyroidism   Dementia ([facility])   Atrial fibrillation with RVR ([place])  Lindsey Goodman is a 78 y.o f with a history of dementia, hypothyroidism, atrial fibrillation who presented after a unwitnessed fall from her SNF, she was noted to be in afib with RVR, EKG showed interolateral ST depressions, and found to have a left femoral intertrochanteric fracture.    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  She was also tachycardic 130-140s.  Patient's nurse was contacted and it was noted that the patient did not get diltiazem drip overnight and it was requested to be restarted with obtaining an EKG.  EKG showed worsening ST depressions in the lateral leads with RVR (140s).  Patient was alert, responsive, interactive with providers.  Denied any symptoms including chest pain, shortness of breath, only complained of some fatigue.  EKG on admission showed new st depression and am ekg 6/18 showed worsening ST  depressions in the setting of tachycardia with rates in the 140s.   Given pressures were low with SBP 80-90s, diltiazem gtt was discontinued and patient was given IV metoprolol 5 mg once. Blood pressure improved somewhat to the 120's so we repeated EKG.  This showed worsening ST elevation in aVR and ST depressions in the lateral leads, more concerning for a left main MI.  Cardiology came in, reported that it's difficult to assess if this is a true NSTEMI or demand ischemia due to Afib with RVR.  Since we were unable to obtain consent and she recently had hip surgery plan to treat medically at this time.  Recommended starting heparin if orthopedic surgeon was okay with this. They agreed with stopping the diltiazem and recommended given amiodarone 150 mg over 1 hour.  Continued the fluid bolus of LR. Ra[od response was present and assisted with interventions.   Contacted ortho, they reported that she can be started on heparin as needed.  Ordered a heparin drip.  Again attempted to contact patient's family with no answer, contacted social work to assist with finding family contact.  Apparently there is a son however he declined to leave a number when he called.  Asked social worker to assist with finding a new number or's family member to contact.  Patient's blood pressure started to decline, initially down to 82/52 however continued to decrease with MAPs <60. she had already received 1 L of LR and a second liter was 1 running wide open.  Cardiology  was still present and we decided to give her a small dose of phenylephrine to provide pressor support to see if BP could improve.  If blood pressure could not improve we had discussed transferring her to ICU for more pressor support. Patient received a total of 200 mcg phenylephrine in 20-50mcg increments with no response, BP was down to 62/53 with a MAP of 58, MAP would not stay about 60. She continued to be somnolent but was still answering some questions at that  time.  Given her intractable hypotension I contacted cardiology who recommended transfer to ICU and starting on Levophed drip, as well as place an A line.  Echocardiogram was present however due to patient's instability she was taken straight to the ICU on the amiodarone drip and the LR bolus.   When she arrived to the ICU patient was no longer responsive, she had no gag reflex and was having agonal breathing's, blood pressure continued to drop, and heart rate continued to decrease until patient became asystolic.  And passed away at 1133.  She was surrounded by nurses and doctors and they report that she appeared comfortable.  Left femoral intertrochanteric neck fracture Presented after unwitnessed fall, unclear if this is related to mechanical or cardiac related. She under went cephalomedullary nail of left intertrochanteric femur fracture on 6/17 with orthopedics. Ortho recommended  Postoperative ancef, weight bearing as tolerated, and may be restarted on eliquis if Hgb is stable. Hgb today is stable at 9.3.   -Ortho following, appreciate recommendations -continue Oxy IR PRN for pain control -Continue fentanyl for pain control  Code Status: DNR    Dispo: Deceased  [name], MD 03-27-19, 7:31 AM Pager: [phone]
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
## 2019-04-06 DEATH — deceased

## 2019-05-30 ENCOUNTER — Ambulatory Visit: Payer: PPO | Admitting: Diagnostic Neuroimaging

## 2019-05-30 ENCOUNTER — Encounter
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
